# Patient Record
Sex: Female | Born: 1970 | Race: Black or African American | Hispanic: No | Marital: Married | Smoking: Never smoker
Health system: Southern US, Community
[De-identification: ages and names within clinical notes are randomized; demographics above are authoritative.]

## PROBLEM LIST (undated history)

## (undated) DIAGNOSIS — K5792 Diverticulitis of intestine, part unspecified, without perforation or abscess without bleeding: Secondary | ICD-10-CM

## (undated) DIAGNOSIS — K5900 Constipation, unspecified: Secondary | ICD-10-CM

## (undated) DIAGNOSIS — S62109A Fracture of unspecified carpal bone, unspecified wrist, initial encounter for closed fracture: Secondary | ICD-10-CM

## (undated) DIAGNOSIS — E785 Hyperlipidemia, unspecified: Secondary | ICD-10-CM

## (undated) DIAGNOSIS — G4730 Sleep apnea, unspecified: Secondary | ICD-10-CM

## (undated) DIAGNOSIS — E559 Vitamin D deficiency, unspecified: Secondary | ICD-10-CM

## (undated) DIAGNOSIS — T7840XA Allergy, unspecified, initial encounter: Secondary | ICD-10-CM

## (undated) DIAGNOSIS — Z8719 Personal history of other diseases of the digestive system: Secondary | ICD-10-CM

## (undated) DIAGNOSIS — K589 Irritable bowel syndrome without diarrhea: Secondary | ICD-10-CM

## (undated) DIAGNOSIS — K219 Gastro-esophageal reflux disease without esophagitis: Secondary | ICD-10-CM

## (undated) DIAGNOSIS — M2550 Pain in unspecified joint: Secondary | ICD-10-CM

## (undated) DIAGNOSIS — G709 Myoneural disorder, unspecified: Secondary | ICD-10-CM

## (undated) DIAGNOSIS — F32A Depression, unspecified: Secondary | ICD-10-CM

## (undated) DIAGNOSIS — I209 Angina pectoris, unspecified: Secondary | ICD-10-CM

## (undated) DIAGNOSIS — G8929 Other chronic pain: Secondary | ICD-10-CM

## (undated) DIAGNOSIS — R600 Localized edema: Secondary | ICD-10-CM

## (undated) DIAGNOSIS — M549 Dorsalgia, unspecified: Secondary | ICD-10-CM

## (undated) DIAGNOSIS — R110 Nausea: Secondary | ICD-10-CM

## (undated) DIAGNOSIS — R7303 Prediabetes: Secondary | ICD-10-CM

## (undated) DIAGNOSIS — E538 Deficiency of other specified B group vitamins: Secondary | ICD-10-CM

## (undated) DIAGNOSIS — K829 Disease of gallbladder, unspecified: Secondary | ICD-10-CM

## (undated) DIAGNOSIS — M5030 Other cervical disc degeneration, unspecified cervical region: Secondary | ICD-10-CM

## (undated) DIAGNOSIS — R21 Rash and other nonspecific skin eruption: Secondary | ICD-10-CM

## (undated) DIAGNOSIS — R0602 Shortness of breath: Secondary | ICD-10-CM

## (undated) DIAGNOSIS — S82899A Other fracture of unspecified lower leg, initial encounter for closed fracture: Secondary | ICD-10-CM

## (undated) DIAGNOSIS — J45909 Unspecified asthma, uncomplicated: Secondary | ICD-10-CM

## (undated) HISTORY — DX: Sleep apnea, unspecified: G47.30

## (undated) HISTORY — DX: Dorsalgia, unspecified: M54.9

## (undated) HISTORY — DX: Disease of gallbladder, unspecified: K82.9

## (undated) HISTORY — DX: Hyperlipidemia, unspecified: E78.5

## (undated) HISTORY — PX: BREAST CYST EXCISION: SHX579

## (undated) HISTORY — DX: Pain in unspecified joint: M25.50

## (undated) HISTORY — DX: Shortness of breath: R06.02

## (undated) HISTORY — DX: Localized edema: R60.0

## (undated) HISTORY — DX: Diverticulitis of intestine, part unspecified, without perforation or abscess without bleeding: K57.92

## (undated) HISTORY — DX: Vitamin D deficiency, unspecified: E55.9

## (undated) HISTORY — PX: WISDOM TOOTH EXTRACTION: SHX21

## (undated) HISTORY — DX: Depression, unspecified: F32.A

## (undated) HISTORY — DX: Allergy, unspecified, initial encounter: T78.40XA

## (undated) HISTORY — DX: Deficiency of other specified B group vitamins: E53.8

## (undated) HISTORY — DX: Irritable bowel syndrome, unspecified: K58.9

## (undated) HISTORY — DX: Constipation, unspecified: K59.00

## (undated) HISTORY — PX: HERNIA REPAIR: SHX51

---

## 1995-12-22 HISTORY — PX: TUBAL LIGATION: SHX77

## 1997-10-30 ENCOUNTER — Other Ambulatory Visit: Admission: RE | Admit: 1997-10-30 | Discharge: 1997-10-30 | Payer: Self-pay | Admitting: Family Medicine

## 1998-08-19 ENCOUNTER — Encounter: Payer: Self-pay | Admitting: Emergency Medicine

## 1998-08-19 ENCOUNTER — Ambulatory Visit (HOSPITAL_COMMUNITY): Admission: RE | Admit: 1998-08-19 | Discharge: 1998-08-19 | Payer: Self-pay | Admitting: *Deleted

## 2009-02-03 ENCOUNTER — Ambulatory Visit (HOSPITAL_COMMUNITY): Admission: RE | Admit: 2009-02-03 | Discharge: 2009-02-03 | Payer: Self-pay | Admitting: Obstetrics and Gynecology

## 2010-02-17 ENCOUNTER — Ambulatory Visit (HOSPITAL_COMMUNITY)
Admission: RE | Admit: 2010-02-17 | Discharge: 2010-02-17 | Payer: Self-pay | Source: Home / Self Care | Attending: Internal Medicine | Admitting: Internal Medicine

## 2010-06-16 ENCOUNTER — Other Ambulatory Visit (HOSPITAL_COMMUNITY): Payer: Self-pay | Admitting: Gastroenterology

## 2010-06-16 DIAGNOSIS — R110 Nausea: Secondary | ICD-10-CM

## 2010-06-29 ENCOUNTER — Encounter (HOSPITAL_COMMUNITY): Payer: Self-pay

## 2010-06-29 ENCOUNTER — Encounter (HOSPITAL_COMMUNITY)
Admission: RE | Admit: 2010-06-29 | Discharge: 2010-06-29 | Disposition: A | Discharge status: Home / Self Care | Payer: BC Managed Care – PPO | Source: Ambulatory Visit | Attending: Gastroenterology | Admitting: Gastroenterology

## 2010-06-29 ENCOUNTER — Ambulatory Visit (HOSPITAL_COMMUNITY)
Admission: RE | Admit: 2010-06-29 | Discharge: 2010-06-29 | Disposition: A | Discharge status: Home / Self Care | Payer: BC Managed Care – PPO | Source: Ambulatory Visit | Attending: Gastroenterology | Admitting: Gastroenterology

## 2010-06-29 DIAGNOSIS — R1011 Right upper quadrant pain: Secondary | ICD-10-CM | POA: Insufficient documentation

## 2010-06-29 DIAGNOSIS — Q619 Cystic kidney disease, unspecified: Secondary | ICD-10-CM | POA: Insufficient documentation

## 2010-06-29 DIAGNOSIS — R110 Nausea: Secondary | ICD-10-CM | POA: Insufficient documentation

## 2010-06-29 HISTORY — DX: Nausea: R11.0

## 2010-06-29 MED ORDER — TECHNETIUM TC 99M MEBROFENIN IV KIT: 5.5000 | PACK | Freq: Once | INTRAVENOUS | Status: AC | PRN

## 2010-06-29 MED ORDER — SINCALIDE 5 MCG IJ SOLR: 0.0200 ug/kg | Freq: Once | INTRAMUSCULAR | Status: DC

## 2010-07-22 HISTORY — PX: COLONOSCOPY: SHX174

## 2010-08-21 HISTORY — PX: CHOLECYSTECTOMY: SHX55

## 2010-08-23 ENCOUNTER — Encounter (INDEPENDENT_AMBULATORY_CARE_PROVIDER_SITE_OTHER): Payer: Self-pay | Admitting: General Surgery

## 2010-08-23 ENCOUNTER — Ambulatory Visit (INDEPENDENT_AMBULATORY_CARE_PROVIDER_SITE_OTHER): Payer: BC Managed Care – PPO | Admitting: General Surgery

## 2010-08-23 DIAGNOSIS — D126 Benign neoplasm of colon, unspecified: Secondary | ICD-10-CM

## 2010-08-23 DIAGNOSIS — K635 Polyp of colon: Secondary | ICD-10-CM

## 2010-08-23 DIAGNOSIS — R109 Unspecified abdominal pain: Secondary | ICD-10-CM | POA: Insufficient documentation

## 2010-08-23 DIAGNOSIS — R630 Anorexia: Secondary | ICD-10-CM

## 2010-08-23 DIAGNOSIS — J45909 Unspecified asthma, uncomplicated: Secondary | ICD-10-CM | POA: Insufficient documentation

## 2010-08-23 DIAGNOSIS — Z973 Presence of spectacles and contact lenses: Secondary | ICD-10-CM

## 2010-08-23 DIAGNOSIS — K828 Other specified diseases of gallbladder: Secondary | ICD-10-CM

## 2010-08-23 DIAGNOSIS — R233 Spontaneous ecchymoses: Secondary | ICD-10-CM | POA: Insufficient documentation

## 2010-08-23 DIAGNOSIS — R238 Other skin changes: Secondary | ICD-10-CM

## 2010-08-23 DIAGNOSIS — E7800 Pure hypercholesterolemia, unspecified: Secondary | ICD-10-CM | POA: Insufficient documentation

## 2010-08-23 NOTE — Progress Notes (Signed)
Subjective:     Patient ID: [name], female   DOB: [birth year], 40 y.o.   MRN: [id number]    BP 136/94  Pulse 72  Temp(Src) 96.4 F (35.8 C) (Temporal)  Ht 5\' 8"  (1.727 m)  Wt 223 lb (101.152 kg)  BMI 33.91 kg/m2    HPI The patient presents with a history of abdominal pain starting in her epigastrium radiating towards her left upper quadrant in her lower pubic abdominal area. This is usually postprandial pain associated with nausea but no vomiting she's had no fevers and chills and no jaundice. Her evaluation has consisted of a colonoscopy, upper GI endoscopy, an ultrasound of the abdomen, and a nuclear medicine study of her gallbladder. The only positive study was a nuclear medicine study which showed an ejection fraction of 34.4%. On the patient's history she reports that she had significant discomfort and retardation of her usual postprandial symptoms with injection of the cholecystokinin was given for her nuclear medicine study.  Review of Systems  Constitutional: Positive for appetite change.  Gastrointestinal: Positive for nausea, abdominal pain (LUQ and suprapubic) and constipation (history of chronic constipation). Negative for vomiting and diarrhea.       Objective:   Physical Exam  Constitutional: She is oriented to person, place, and time. She appears well-developed and well-nourished.  HENT:  Head: Normocephalic and atraumatic.  Nose: Nose normal.  Eyes: EOM are normal. Pupils are equal, round, and reactive to light.  Neck: Normal range of motion. Neck supple.  Cardiovascular: Normal rate and regular rhythm.   No murmur heard. Pulmonary/Chest: Effort normal and breath sounds normal.  Abdominal: Soft. There is tenderness (Mild LUQ tendernes and epigastric tenderness).  Musculoskeletal: Normal range of motion.  Lymphadenopathy:    She has no cervical adenopathy.  Neurological: She is alert and oriented to person, place, and time. She displays normal reflexes.    Skin: Skin is warm and dry.  Psychiatric: She has a normal mood and affect. Her behavior is normal. Thought content normal.       Assessment:     Biliary Dyskinesia with symptoms. The patient had symptoms that were provoked by cholecystokinin injection. Her pain is usually in the left upper quadrant and epigastrium and it is very frequently postprandially with anything that she eats. She has no jaundice and no evidence of stones on recent ultrasound.  The patient has also had an upper GI endoscopy and colonoscopy recently. She was told that she had several colon polyps that were removed that were benign. She also had some evidence of diverticulosis. Upper GI endoscopy was within normal limits.    Plan:     Because of patient's symptoms are very reproducible after she eats, and the fact that she had symptoms after injection of cholecystokinin, I do feel as though the patient has significant relief of her symptoms after cholecystectomy the plan is to perform a laparoscopic cholecystectomy with cholangiogram. This hopefully will resolve her current symptoms however if not we will look in the left upper quadrant where significant amount of her pain resides to see if there is any significant pathology to explain her pain.

## 2010-09-15 ENCOUNTER — Encounter (HOSPITAL_COMMUNITY)
Admission: RE | Admit: 2010-09-15 | Discharge: 2010-09-15 | Disposition: A | Discharge status: Home / Self Care | Payer: BC Managed Care – PPO | Source: Ambulatory Visit | Attending: General Surgery | Admitting: General Surgery

## 2010-09-15 LAB — SURGICAL PCR SCREEN
MRSA, PCR: NEGATIVE
Staphylococcus aureus: NEGATIVE

## 2010-09-15 LAB — CBC
HCT: 41.1 % (ref 36.0–46.0)
Hemoglobin: 14.1 g/dL (ref 12.0–15.0)
MCH: 27.5 pg (ref 26.0–34.0)
MCHC: 34.3 g/dL (ref 30.0–36.0)
RDW: 12.9 % (ref 11.5–15.5)

## 2010-09-15 LAB — DIFFERENTIAL
Eosinophils Relative: 1 % (ref 0–5)
Lymphocytes Relative: 44 % (ref 12–46)
Monocytes Absolute: 0.3 10*3/uL (ref 0.1–1.0)
Monocytes Relative: 7 % (ref 3–12)
Neutro Abs: 2.3 10*3/uL (ref 1.7–7.7)

## 2010-09-15 LAB — COMPREHENSIVE METABOLIC PANEL
BUN: 8 mg/dL (ref 6–23)
Calcium: 9.6 mg/dL (ref 8.4–10.5)
Creatinine, Ser: 0.59 mg/dL (ref 0.50–1.10)
GFR calc Af Amer: >60 mL/min (ref >60)
Glucose, Bld: 91 mg/dL (ref 70–99)
Total Protein: 7 g/dL (ref 6.0–8.3)

## 2010-09-15 LAB — HCG, SERUM, QUALITATIVE: Preg, Serum: NEGATIVE

## 2010-09-20 ENCOUNTER — Ambulatory Visit (HOSPITAL_COMMUNITY): Payer: BC Managed Care – PPO

## 2010-09-20 ENCOUNTER — Ambulatory Visit (HOSPITAL_COMMUNITY)
Admission: RE | Admit: 2010-09-20 | Discharge: 2010-09-20 | Disposition: A | Discharge status: Home / Self Care | Payer: BC Managed Care – PPO | Source: Ambulatory Visit | Attending: General Surgery | Admitting: General Surgery

## 2010-09-20 ENCOUNTER — Other Ambulatory Visit (INDEPENDENT_AMBULATORY_CARE_PROVIDER_SITE_OTHER): Payer: Self-pay | Admitting: General Surgery

## 2010-09-20 DIAGNOSIS — K811 Chronic cholecystitis: Secondary | ICD-10-CM

## 2010-09-20 DIAGNOSIS — Z01812 Encounter for preprocedural laboratory examination: Secondary | ICD-10-CM | POA: Insufficient documentation

## 2010-09-21 NOTE — Op Note (Signed)
[name], [name] NO.:  [id number]  MEDICAL RECORD NO.:  [id number]  LOCATION:  [facility]                         FACILITY:  [facility]  PHYSICIAN:  [name], M.D.    DATE OF BIRTH:  1971/01/01  DATE OF PROCEDURE:  09/20/2010 DATE OF DISCHARGE:                              OPERATIVE REPORT   PREOPERATIVE DIAGNOSIS:  Biliary dyskinesia with symptoms.  POSTOPERATIVE DIAGNOSIS:  Biliary dyskinesia with symptoms.  PROCEDURE:  Laparoscopic cholecystectomy with cholangiogram.  SURGEON:  Tanya Ridges, MD.  ANESTHESIA:  General endotracheal.  ESTIMATED BLOOD LOSS:  Less than 20 mL.  No complications.  CONDITION:  Stable.  FINDINGS:  Normal intraoperative cholangiogram with no evidence of acute inflammation.  INDICATIONS FOR OPERATION:  The patient is a 40 year old with symptomatic biliary dyskinesia with an ejection fraction of 34%, comes in now for elective laparoscopic cholecystectomy.  OPERATION:  The patient was taken to the operating room, placed on table in supine position.  After an adequate general endotracheal anesthetic was administered, she was prepped and draped in usual sterile manner, exposing midline.  A supraumbilical midline incision was made using #15 blade and taken down to the midline fascia.  It was noted upon entering that area that the patient had a supraumbilical 1-2 cm supraumbilical fascial defect, which was used as the portal site for the Hasson cannula.  After we isolated the fascial opening and caught off the hernia sac, we passed a pursestring suture of 0 Vicryl around the fascial opening and then passed a Hasson cannula into the peritoneal cavity, which we used to insufflate with carbon dioxide gas up to a maximal pressure of 50 mmHg.  Once this was done, two right upper quadrant 5-mm cannulas and a subxiphoid 5-mm cannula passed under direct vision.  We then placed the patient in reverse Trendelenburg, left-side was  tilted down, we began our dissection.  We grabbed the gallbladder with a ratcheted grasper through the lateral- most cannula and retracted towards the anterior abdominal wall and right upper quadrant.  A second grasper was used in order to open up the peritoneum overlying the triangle of Fallot and hepatic duodenal triangle.  We dissected out the cystic duct and cystic artery.  Once the cystic duct was dissected free, we clipped along the gallbladder side, made a choledococystomy using laparoscopic scissors, passed a Cook catheter through the anterior abdominal wall and then passed the catheter tip into the choledococystomy for the cholangiogram.  The cholangiogram showed good flow into the duodenum, no intraductal filling defects, no dilatation, good proximal filling.  Once the cholangiogram was completed, we removed the clip securing it in place.  We clipped the cystic duct distally x3 then transected it.  We dissected out the cystic artery and clipped it proximally x2 and distally x2 and then transected it.  We then dissected out the gallbladder from its bed with minimal difficulty without any puncturing or leakage of bile.  Once this was completed, we removed the gallbladder from the supraumbilical site using a large grasper.  We then tied off the fascial site using a pursestring suture was in place.  We had to reinforce that with a corner stitch of  figure-of-eight of 0 Vicryl because of a small opening.  We irrigated with about 200 mL of saline solution.  There was minimal bleeding and no bile staining.  All fluid and gas were aspirated from above the liver.  We removed all cannulas.  The skin at the supraumbilical site was closed using running subcuticular stitch of 4-0 Monocryl.  This was after all sites were injected with 0.25% Marcaine with epi.  Dermabond, Steri-Strips, and Tegaderm was used to complete all dressing.  All needle, sponge counts, and instrument counts were  correct.     [name], M.D.     JOW/MEDQ  D:  09/20/2010  T:  09/20/2010  Job:  [id number]  cc:   Tanya Rod, MD, [facility]  Electronically Signed by [name] M.D. on 09/21/2010 02:39:51 PM

## 2010-10-04 ENCOUNTER — Ambulatory Visit (INDEPENDENT_AMBULATORY_CARE_PROVIDER_SITE_OTHER): Payer: BC Managed Care – PPO | Admitting: General Surgery

## 2010-10-04 ENCOUNTER — Encounter (INDEPENDENT_AMBULATORY_CARE_PROVIDER_SITE_OTHER): Payer: Self-pay | Admitting: General Surgery

## 2010-10-04 DIAGNOSIS — Z09 Encounter for follow-up examination after completed treatment for conditions other than malignant neoplasm: Secondary | ICD-10-CM

## 2010-10-04 NOTE — Progress Notes (Signed)
HPI The patient is status post laparoscopic cholecystectomy and is doing well.  PE On examination all her incisions are healing well with no evidence of infection. I was able to remove all the dressings without any separation of the wound  Studiy review None.  Assessment We will status post laparoscopic cholecystectomy.  Plan The patient is to take another week out from the ED heavy lifting more than 25 pounds and he can resume her usual activity. She is to return to see me on a p.r.n. basis

## 2011-01-17 DIAGNOSIS — G8929 Other chronic pain: Secondary | ICD-10-CM

## 2011-01-17 HISTORY — DX: Other chronic pain: G89.29

## 2011-01-31 ENCOUNTER — Ambulatory Visit: Payer: Worker's Compensation

## 2011-02-08 ENCOUNTER — Ambulatory Visit (INDEPENDENT_AMBULATORY_CARE_PROVIDER_SITE_OTHER): Payer: BC Managed Care – PPO

## 2011-02-08 ENCOUNTER — Ambulatory Visit (INDEPENDENT_AMBULATORY_CARE_PROVIDER_SITE_OTHER): Payer: Worker's Compensation

## 2011-02-08 DIAGNOSIS — N898 Other specified noninflammatory disorders of vagina: Secondary | ICD-10-CM

## 2011-02-08 DIAGNOSIS — R82998 Other abnormal findings in urine: Secondary | ICD-10-CM

## 2011-02-17 ENCOUNTER — Other Ambulatory Visit (HOSPITAL_COMMUNITY): Payer: Self-pay | Admitting: Internal Medicine

## 2011-02-17 DIAGNOSIS — Z1231 Encounter for screening mammogram for malignant neoplasm of breast: Secondary | ICD-10-CM

## 2011-03-16 ENCOUNTER — Ambulatory Visit (HOSPITAL_COMMUNITY)
Admission: RE | Admit: 2011-03-16 | Discharge: 2011-03-16 | Disposition: A | Discharge status: Home / Self Care | Payer: BC Managed Care – PPO | Source: Ambulatory Visit | Attending: Internal Medicine | Admitting: Internal Medicine

## 2011-03-16 DIAGNOSIS — Z1231 Encounter for screening mammogram for malignant neoplasm of breast: Secondary | ICD-10-CM | POA: Insufficient documentation

## 2011-03-24 ENCOUNTER — Ambulatory Visit (INDEPENDENT_AMBULATORY_CARE_PROVIDER_SITE_OTHER): Payer: BC Managed Care – PPO | Admitting: Physician Assistant

## 2011-03-24 ENCOUNTER — Encounter: Payer: Self-pay | Admitting: Physician Assistant

## 2011-03-24 VITALS — BP 126/88 | HR 82 | Temp 97.8°F | Resp 16 | Ht 68.0 in | Wt 231.0 lb

## 2011-03-24 DIAGNOSIS — B3731 Acute candidiasis of vulva and vagina: Secondary | ICD-10-CM

## 2011-03-24 DIAGNOSIS — R110 Nausea: Secondary | ICD-10-CM

## 2011-03-24 DIAGNOSIS — R1033 Periumbilical pain: Secondary | ICD-10-CM

## 2011-03-24 DIAGNOSIS — B373 Candidiasis of vulva and vagina: Secondary | ICD-10-CM

## 2011-03-24 DIAGNOSIS — N760 Acute vaginitis: Secondary | ICD-10-CM

## 2011-03-24 DIAGNOSIS — K439 Ventral hernia without obstruction or gangrene: Secondary | ICD-10-CM

## 2011-03-24 DIAGNOSIS — A499 Bacterial infection, unspecified: Secondary | ICD-10-CM

## 2011-03-24 DIAGNOSIS — N390 Urinary tract infection, site not specified: Secondary | ICD-10-CM

## 2011-03-24 DIAGNOSIS — N898 Other specified noninflammatory disorders of vagina: Secondary | ICD-10-CM

## 2011-03-24 LAB — POCT URINALYSIS DIPSTICK
Bilirubin, UA: NEGATIVE
Glucose, UA: NEGATIVE
Leukocytes, UA: NEGATIVE
Nitrite, UA: NEGATIVE
pH, UA: 5.5

## 2011-03-24 LAB — POCT WET PREP WITH KOH: KOH Prep POC: POSITIVE

## 2011-03-24 LAB — GLUCOSE, POCT (MANUAL RESULT ENTRY): POC Glucose: 94

## 2011-03-24 LAB — POCT UA - MICROSCOPIC ONLY: Yeast, UA: NEGATIVE

## 2011-03-24 MED ORDER — FLUCONAZOLE 150 MG PO TABS: ORAL_TABLET | ORAL | Status: DC

## 2011-03-24 NOTE — Patient Instructions (Signed)
If pain suddenly worsens, the hernia is at risk for strangulation. Patient to go to Emergency Room if this happens prior to consultation with surgeon.  Patient will alert Physical Therapy to minimize any procedures or exercises that may increase intra-abdominal pressure or reproduce/stimulate vagal maneuver. Patient will have Physical Therapy review this note.

## 2011-03-24 NOTE — Progress Notes (Signed)
Subjective:    Patient ID: [name], female    DOB: 07-31-1970, 41 y.o.   MRN: [id number]  HPI  Tanya Aliment, MD, MD  41 y.o. y/o AAF presents with Mid abdominal pain x 1 week.  (See last OV-also here for recheck vaginal discharge and UTI.)  Still c/o vaginal itching recurring.  HPI characteristics below are regarding abd pain.  Recently started PT for worker's comp.  NO Std risk factors  Onset: 1 week ago Location mid abdomen Duration 1 week Severity mild to moderate Quality/Character Sharp, intense Exacerbating/Alleviating Factors: worsened by vagal maneuvers, sitting up  Associated Signs and Symptoms: Nausea  Past Medical History  Diagnosis Date  . Abdominal pain   . Nausea     Family History  Problem Relation Age of Onset  . Diabetes Mother   . Heart disease Father   . Cancer Father     kidney and prostate  . Hyperlipidemia Father   . Other Father     gout and clogged arteries  . Hyperlipidemia Sister   . Diabetes Sister   . Diabetes Brother   . Hyperlipidemia Brother    Surgeon:  Dr. Lindie Spruce Smoker: NO Alcohol/illicits: NO  Past Surgical History  Procedure Date  . Tubal ligation 01/1996  . Colonoscopy 07/2010    Removal of 3 polyps  . Abscess removal 1983    left breast  . Cholecystectomy     Health Maintenance: Due for pap this year  Review of Systems  Constitutional: Negative for fever, chills and appetite change.  HENT: Negative.   Eyes: Negative.   Respiratory: Negative.   Cardiovascular: Negative.   Gastrointestinal: Negative for abdominal distention.  Genitourinary: Positive for vaginal discharge (pruritis). Negative for dysuria, urgency, frequency, hematuria, flank pain, vaginal bleeding, difficulty urinating and dyspareunia.  Musculoskeletal: Negative.   Neurological: Negative.   Hematological: Negative.   Psychiatric/Behavioral: Negative.        Objective:   Physical Exam  Nursing note and vitals reviewed. Constitutional:  She appears well-developed and well-nourished. No distress (non-toxic appearing).  HENT:  Head: Normocephalic and atraumatic.  Eyes: Conjunctivae and EOM are normal. Pupils are equal, round, and reactive to light.  Neck: Normal range of motion. Neck supple. No thyromegaly present.  Cardiovascular: Normal rate, regular rhythm, normal heart sounds and intact distal pulses.  Exam reveals no gallop and no friction rub.   No murmur heard. Pulmonary/Chest: Effort normal and breath sounds normal.  Abdominal: Soft. Normal appearance, normal aorta and bowel sounds are normal. She exhibits no distension, no fluid wave, no abdominal bruit, no ascites, no pulsatile midline mass and no mass. There is no hepatosplenomegaly, splenomegaly or hepatomegaly. There is no rigidity, no rebound, no guarding, no CVA tenderness, no tenderness at McBurney's point and negative Murphy's sign. A hernia is present. Hernia confirmed positive in the ventral area. Hernia confirmed negative in the right inguinal area and confirmed negative in the left inguinal area.                                                            Range  Results for orders placed in visit on 03/24/11  POCT UA - MICROSCOPIC ONLY      Component Value Range   WBC, Ur, HPF, POC 1-2     RBC, urine, microscopic 1-2     Bacteria, U Microscopic trace     Mucus, UA trace     Epithelial cells, urine per micros 1-3     Crystals, Ur, HPF, POC negative     Casts, Ur, LPF, POC negative     Yeast, UA negative    POCT URINALYSIS DIPSTICK      Component Value Range   Color, UA yellow     Clarity, UA clear     Glucose, UA negative     Bilirubin, UA negative     Ketones, UA negative     Spec Grav, UA 1.020     Blood, UA small     pH, UA 5.5     Protein, UA negative     Urobilinogen, UA 0.2     Nitrite, UA negative     Leukocytes, UA Negative    POCT WET PREP WITH  KOH      Component Value Range   Trichomonas, UA Negative     Clue Cells Wet Prep HPF POC 3-6     Epithelial Wet Prep HPF POC 3-6     Yeast Wet Prep HPF POC negative     Bacteria Wet Prep HPF POC 2+     RBC Wet Prep HPF POC 0-1     WBC Wet Prep HPF POC 0-1     KOH Prep POC Positive    POCT URINE PREGNANCY      Component Value Range   Preg Test, Ur Negative    GLUCOSE, POCT (MANUAL RESULT ENTRY)      Component Value Range   POC Glucose 94         Assessment & Plan:  Reducible ventral hernia-refer to general surgery.  Preferably Dr. Lindie Spruce.  Yeast vaginitis-Rx diflucan

## 2011-03-30 ENCOUNTER — Other Ambulatory Visit: Payer: Self-pay | Admitting: Nurse Practitioner

## 2011-03-30 DIAGNOSIS — K469 Unspecified abdominal hernia without obstruction or gangrene: Secondary | ICD-10-CM

## 2011-03-31 ENCOUNTER — Ambulatory Visit
Admission: RE | Admit: 2011-03-31 | Discharge: 2011-03-31 | Disposition: A | Discharge status: Home / Self Care | Payer: BC Managed Care – PPO | Source: Ambulatory Visit | Attending: Nurse Practitioner | Admitting: Nurse Practitioner

## 2011-03-31 DIAGNOSIS — K469 Unspecified abdominal hernia without obstruction or gangrene: Secondary | ICD-10-CM

## 2011-03-31 MED ORDER — IOHEXOL 300 MG/ML  SOLN
125.0000 mL | Freq: Once | INTRAMUSCULAR | Status: AC | PRN
  Administered 2011-03-31: 125 mL via INTRAVENOUS

## 2011-04-17 ENCOUNTER — Telehealth (INDEPENDENT_AMBULATORY_CARE_PROVIDER_SITE_OTHER): Payer: Self-pay

## 2011-04-17 NOTE — Telephone Encounter (Signed)
The patient called to move her appointment up because she has a hernia.  She is getting nauseated feeling like she may vomit and her bowels haven't moved in 2 days.  I moved her appt to 3/1 per Marcelino Duster.  I advised she needs to take some Miralax for her bowels, drink plenty of liquids and stay on a liquid diet until she has a bowel movement.  She will call us back if that doesn't work.

## 2011-04-21 ENCOUNTER — Encounter (INDEPENDENT_AMBULATORY_CARE_PROVIDER_SITE_OTHER): Payer: Self-pay | Admitting: General Surgery

## 2011-04-21 ENCOUNTER — Ambulatory Visit (INDEPENDENT_AMBULATORY_CARE_PROVIDER_SITE_OTHER): Payer: BC Managed Care – PPO | Admitting: General Surgery

## 2011-04-21 VITALS — BP 132/88 | HR 80 | Temp 97.8°F | Resp 16 | Ht 69.0 in | Wt 231.8 lb

## 2011-04-21 DIAGNOSIS — K432 Incisional hernia without obstruction or gangrene: Secondary | ICD-10-CM

## 2011-04-21 NOTE — Progress Notes (Deleted)
Patient ID: [name], female   DOB: 1970/08/07, 41 y.o.   MRN: [id number]  Chief Complaint  Patient presents with  . Umbilical Hernia    est pt- eval peri-umb hernia    HPI [name] is a 41 y.o. female.  *** HPI  Past Medical History  Diagnosis Date  . Abdominal pain   . Nausea   . Hernia   . Back pain   . Hyperlipidemia   . Asthma     Past Surgical History  Procedure Date  . Tubal ligation 01/1996  . Colonoscopy 07/2010    Removal of 3 polyps  . Abscess removal 1983    left breast  . Cholecystectomy   . Wisdom tooth extraction     Family History  Problem Relation Age of Onset  . Diabetes Mother   . Heart disease Father   . Cancer Father     kidney and prostate  . Hyperlipidemia Father   . Other Father     gout and clogged arteries  . Hyperlipidemia Sister   . Diabetes Sister   . Diabetes Brother   . Hyperlipidemia Brother     Social History History  Substance Use Topics  . Smoking status: Never Smoker   . Smokeless tobacco: Not on file  . Alcohol Use: No    Allergies  Allergen Reactions  . Amoxicillin Hives  . Sulfa Antibiotics Hives    Current Outpatient Prescriptions  Medication Sig Dispense Refill  . albuterol (PROVENTIL HFA;VENTOLIN HFA) 108 (90 BASE) MCG/ACT inhaler Inhale 2 puffs into the lungs every 6 (six) hours as needed.      . cetirizine (ZYRTEC) 10 MG tablet Take 10 mg by mouth daily.        . cyclobenzaprine (FLEXERIL) 10 MG tablet Take 10 mg by mouth 3 (three) times daily as needed.      . docusate sodium (COLACE) 50 MG capsule Take by mouth 2 (two) times daily.        . fluconazole (DIFLUCAN) 150 MG tablet 1 tab po each week for the next 4 weeks  4 tablet  0  . naproxen sodium (ANAPROX) 220 MG tablet Take 220 mg by mouth 2 (two) times daily with a meal.      . Polyethylene Glycol 3350 (MIRALAX PO) Take by mouth daily.        . Probiotic Product (SOLUBLE FIBER/PROBIOTICS PO) Take by mouth. Patient also take a fiber  multivitamin.       Marland Kitchen RANITIDINE HCL PO Take by mouth as needed.      . rosuvastatin (CRESTOR) 5 MG tablet Take 5 mg by mouth daily.        . traMADol (ULTRAM) 50 MG tablet as needed.        Review of Systems Review of Systems  Blood pressure 132/88, pulse 80, temperature 97.8 F (36.6 C), temperature source Temporal, resp. rate 16, height 5\' 9"  (1.753 m), weight 231 lb 12.8 oz (105.144 kg).  Physical Exam Physical Exam  Data Reviewed ***  Assessment    ***    Plan    ***       Selestino Nila III,Ann[place] Nellums O 04/21/2011, 2:28 PM

## 2011-04-21 NOTE — Progress Notes (Signed)
Patient ID: [name], female   DOB: 1970/04/07, 41 y.o.   MRN: [id number]  Chief Complaint  Patient presents with  . Umbilical Hernia    est pt- eval peri-umb hernia    HPI [name] is a 41 y.o. female.  With peri-umbilical hernia near GB incision site HPI  Past Medical History  Diagnosis Date  . Abdominal pain   . Nausea   . Hernia   . Back pain   . Hyperlipidemia   . Asthma     Past Surgical History  Procedure Date  . Tubal ligation 01/1996  . Colonoscopy 07/2010    Removal of 3 polyps  . Abscess removal 1983    left breast  . Cholecystectomy   . Wisdom tooth extraction     Family History  Problem Relation Age of Onset  . Diabetes Mother   . Heart disease Father   . Cancer Father     kidney and prostate  . Hyperlipidemia Father   . Other Father     gout and clogged arteries  . Hyperlipidemia Sister   . Diabetes Sister   . Diabetes Brother   . Hyperlipidemia Brother     Social History History  Substance Use Topics  . Smoking status: Never Smoker   . Smokeless tobacco: Not on file  . Alcohol Use: No    Allergies  Allergen Reactions  . Amoxicillin Hives  . Sulfa Antibiotics Hives    Current Outpatient Prescriptions  Medication Sig Dispense Refill  . albuterol (PROVENTIL HFA;VENTOLIN HFA) 108 (90 BASE) MCG/ACT inhaler Inhale 2 puffs into the lungs every 6 (six) hours as needed.      . cetirizine (ZYRTEC) 10 MG tablet Take 10 mg by mouth daily.        . cyclobenzaprine (FLEXERIL) 10 MG tablet Take 10 mg by mouth 3 (three) times daily as needed.      . docusate sodium (COLACE) 50 MG capsule Take by mouth 2 (two) times daily.        . fluconazole (DIFLUCAN) 150 MG tablet 1 tab po each week for the next 4 weeks  4 tablet  0  . naproxen sodium (ANAPROX) 220 MG tablet Take 220 mg by mouth 2 (two) times daily with a meal.      . Polyethylene Glycol 3350 (MIRALAX PO) Take by mouth daily.        . Probiotic Product (SOLUBLE FIBER/PROBIOTICS  PO) Take by mouth. Patient also take a fiber multivitamin.       Marland Kitchen RANITIDINE HCL PO Take by mouth as needed.      . rosuvastatin (CRESTOR) 5 MG tablet Take 5 mg by mouth daily.        . traMADol (ULTRAM) 50 MG tablet as needed.        Review of Systems Review of Systems  Constitutional: Negative.   HENT: Negative.   Respiratory: Negative.   Cardiovascular: Negative.   Gastrointestinal: Positive for abdominal pain (periumbilical and LUQ, same as prior to GB surgery).  Genitourinary: Negative.   Musculoskeletal: Negative.  Back pain: fall last year.  Skin: Negative.   Hematological: Negative.   Psychiatric/Behavioral: Negative.     Blood pressure 132/88, pulse 80, temperature 97.8 F (36.6 C), temperature source Temporal, resp. rate 16, height 5\' 9"  (1.753 m), weight 231 lb 12.8 oz (105.144 kg).  Physical Exam Physical Exam  Constitutional: She is oriented to person, place, and time. She appears well-developed and well-nourished.  HENT:  Head:  Normocephalic and atraumatic.  Eyes: Conjunctivae and EOM are normal. Pupils are equal, round, and reactive to light.  Neck: Normal range of motion. Neck supple.  Cardiovascular: Normal rate and regular rhythm.   Pulmonary/Chest: Effort normal and breath sounds normal.  Abdominal: There is tenderness (at herna site).    Musculoskeletal: Normal range of motion.  Neurological: She is alert and oriented to person, place, and time. She has normal reflexes.  Skin: Skin is warm.  Psychiatric: She has a normal mood and affect.    Data Reviewed CT scan showing hernia  Assessment    Incisional/peri-umbilical hernia    Plan    Laparoscopic repair with mesh       Heather Mckendree III,Caralynn Gelber O 04/21/2011, 2:43 PM

## 2011-05-02 ENCOUNTER — Encounter (INDEPENDENT_AMBULATORY_CARE_PROVIDER_SITE_OTHER): Payer: BC Managed Care – PPO | Admitting: General Surgery

## 2011-05-22 ENCOUNTER — Other Ambulatory Visit: Payer: Self-pay

## 2011-05-22 ENCOUNTER — Telehealth (INDEPENDENT_AMBULATORY_CARE_PROVIDER_SITE_OTHER): Payer: Self-pay | Admitting: General Surgery

## 2011-05-22 ENCOUNTER — Observation Stay (HOSPITAL_COMMUNITY)
Admission: EM | Admit: 2011-05-22 | Discharge: 2011-05-22 | Disposition: A | Discharge status: Home / Self Care | Payer: BC Managed Care – PPO | Attending: Emergency Medicine | Admitting: Emergency Medicine

## 2011-05-22 ENCOUNTER — Encounter (HOSPITAL_COMMUNITY): Payer: Self-pay | Admitting: Emergency Medicine

## 2011-05-22 ENCOUNTER — Emergency Department (HOSPITAL_COMMUNITY): Payer: BC Managed Care – PPO

## 2011-05-22 DIAGNOSIS — R079 Chest pain, unspecified: Secondary | ICD-10-CM

## 2011-05-22 DIAGNOSIS — Z8249 Family history of ischemic heart disease and other diseases of the circulatory system: Secondary | ICD-10-CM | POA: Insufficient documentation

## 2011-05-22 DIAGNOSIS — E7800 Pure hypercholesterolemia, unspecified: Secondary | ICD-10-CM | POA: Insufficient documentation

## 2011-05-22 DIAGNOSIS — I209 Angina pectoris, unspecified: Secondary | ICD-10-CM

## 2011-05-22 DIAGNOSIS — K469 Unspecified abdominal hernia without obstruction or gangrene: Secondary | ICD-10-CM

## 2011-05-22 DIAGNOSIS — R110 Nausea: Secondary | ICD-10-CM | POA: Insufficient documentation

## 2011-05-22 DIAGNOSIS — E669 Obesity, unspecified: Secondary | ICD-10-CM | POA: Insufficient documentation

## 2011-05-22 HISTORY — DX: Angina pectoris, unspecified: I20.9

## 2011-05-22 LAB — POCT I-STAT TROPONIN I
Troponin i, poc: 0 ng/mL (ref 0.00–0.08)
Troponin i, poc: 0 ng/mL (ref 0.00–0.08)
Troponin i, poc: 0 ng/mL (ref 0.00–0.08)

## 2011-05-22 LAB — CBC
HCT: 38.7 % (ref 36.0–46.0)
Hemoglobin: 13.1 g/dL (ref 12.0–15.0)
MCH: 27.6 pg (ref 26.0–34.0)
MCHC: 33.9 g/dL (ref 30.0–36.0)
MCV: 81.5 fL (ref 78.0–100.0)
Platelets: 201 10*3/uL (ref 150–400)
RBC: 4.75 MIL/uL (ref 3.87–5.11)
RDW: 12.5 % (ref 11.5–15.5)
WBC: 5.5 10*3/uL (ref 4.0–10.5)

## 2011-05-22 LAB — DIFFERENTIAL
Basophils Absolute: 0 10*3/uL (ref 0.0–0.1)
Basophils Relative: 0 % (ref 0–1)
Eosinophils Absolute: 0.1 10*3/uL (ref 0.0–0.7)
Eosinophils Relative: 1 % (ref 0–5)
Lymphocytes Relative: 49 % — ABNORMAL HIGH (ref 12–46)
Lymphs Abs: 2.7 10*3/uL (ref 0.7–4.0)
Monocytes Absolute: 0.3 10*3/uL (ref 0.1–1.0)
Monocytes Relative: 6 % (ref 3–12)
Neutro Abs: 2.4 10*3/uL (ref 1.7–7.7)
Neutrophils Relative %: 44 % (ref 43–77)

## 2011-05-22 LAB — COMPREHENSIVE METABOLIC PANEL
ALT: 15 U/L (ref 0–35)
AST: 17 U/L (ref 0–37)
Albumin: 3.8 g/dL (ref 3.5–5.2)
Alkaline Phosphatase: 56 U/L (ref 39–117)
BUN: 10 mg/dL (ref 6–23)
CO2: 27 mEq/L (ref 19–32)
Calcium: 9 mg/dL (ref 8.4–10.5)
Chloride: 105 mEq/L (ref 96–112)
Creatinine, Ser: 0.67 mg/dL (ref 0.50–1.10)
GFR calc Af Amer: >90 mL/min (ref >90)
GFR calc non Af Amer: >90 mL/min (ref >90)
Glucose, Bld: 106 mg/dL — ABNORMAL HIGH (ref 70–99)
Potassium: 4.1 mEq/L (ref 3.5–5.1)
Sodium: 140 mEq/L (ref 135–145)
Total Bilirubin: 0.2 mg/dL — ABNORMAL LOW (ref 0.3–1.2)
Total Protein: 6.6 g/dL (ref 6.0–8.3)

## 2011-05-22 MED ORDER — ASPIRIN 81 MG PO CHEW
324.0000 mg | CHEWABLE_TABLET | Freq: Once | ORAL | Status: AC
  Administered 2011-05-22: 324 mg via ORAL
  Filled 2011-05-22: qty 4

## 2011-05-22 MED ORDER — ONDANSETRON 8 MG PO TBDP: 8.0000 mg | ORAL_TABLET | Freq: Three times a day (TID) | ORAL | Status: AC | PRN

## 2011-05-22 MED ORDER — HYDROCODONE-ACETAMINOPHEN 5-325 MG PO TABS: 1.0000 | ORAL_TABLET | Freq: Four times a day (QID) | ORAL | Status: AC | PRN

## 2011-05-22 NOTE — ED Notes (Signed)
Returned from stress echo.

## 2011-05-22 NOTE — ED Notes (Signed)
Patient currently sitting up in bed; no respiratory or acute distress noted.  Patient updated on plan of care; informed patient that we are currently waiting on EDP to come and talk about test results.  Patient has no other questions or concerns at this time; will continue to monitor. 

## 2011-05-22 NOTE — Discharge Instructions (Signed)
Chest Pain (Nonspecific) It is often hard to give a specific diagnosis for the cause of chest pain. There is always a chance that your pain could be related to something serious, such as a heart attack or a blood clot in the lungs. You need to follow up with your caregiver for further evaluation. CAUSES   Heartburn.   Pneumonia or bronchitis.   Anxiety or stress.   Inflammation around your heart (pericarditis) or lung (pleuritis or pleurisy).   A blood clot in the lung.   A collapsed lung (pneumothorax). It can develop suddenly on its own (spontaneous pneumothorax) or from injury (trauma) to the chest.   Shingles infection (herpes zoster virus).  The chest wall is composed of bones, muscles, and cartilage. Any of these can be the source of the pain.  The bones can be bruised by injury.   The muscles or cartilage can be strained by coughing or overwork.   The cartilage can be affected by inflammation and become sore (costochondritis).  DIAGNOSIS  Lab tests or other studies, such as X-rays, electrocardiography, stress testing, or cardiac imaging, may be needed to find the cause of your pain.  TREATMENT   Treatment depends on what may be causing your chest pain. Treatment may include:   Acid blockers for heartburn.   Anti-inflammatory medicine.   Pain medicine for inflammatory conditions.   Antibiotics if an infection is present.   You may be advised to change lifestyle habits. This includes stopping smoking and avoiding alcohol, caffeine, and chocolate.   You may be advised to keep your head raised (elevated) when sleeping. This reduces the chance of acid going backward from your stomach into your esophagus.   Most of the time, nonspecific chest pain will improve within 2 to 3 days with rest and mild pain medicine.  HOME CARE INSTRUCTIONS   If antibiotics were prescribed, take your antibiotics as directed. Finish them even if you start to feel better.   For the next few  days, avoid physical activities that bring on chest pain. Continue physical activities as directed.   Do not smoke.   Avoid drinking alcohol.   Only take over-the-counter or prescription medicine for pain, discomfort, or fever as directed by your caregiver.   Follow your caregiver's suggestions for further testing if your chest pain does not go away.   Keep any follow-up appointments you made. If you do not go to an appointment, you could develop lasting (chronic) problems with pain. If there is any problem keeping an appointment, you must call to reschedule.  SEEK MEDICAL CARE IF:   You think you are having problems from the medicine you are taking. Read your medicine instructions carefully.   Your chest pain does not go away, even after treatment.   You develop a rash with blisters on your chest.  SEEK IMMEDIATE MEDICAL CARE IF:   You have increased chest pain or pain that spreads to your arm, neck, jaw, back, or abdomen.   You develop shortness of breath, an increasing cough, or you are coughing up blood.   You have severe back or abdominal pain, feel nauseous, or vomit.   You develop severe weakness, fainting, or chills.   You have a fever.  THIS IS AN EMERGENCY. Do not wait to see if the pain will go away. Get medical help at once. Call your local emergency services (911 in [place] Do not drive yourself to the hospital. MAKE SURE YOU:   Understand these instructions.     Will watch your condition.   Will get help right away if you are not doing well or get worse.  Document Released: 11/16/2004 Document Revised: 01/26/2011 Document Reviewed: 09/12/2007 [employer] Patient Information 2012 [employer]. 

## 2011-05-22 NOTE — ED Notes (Signed)
PT. ALSO REPORTS MID CHEST PAIN / NON RADIATING ONSET LAST NIGHT .

## 2011-05-22 NOTE — Progress Notes (Signed)
  Echocardiogram Echocardiogram Stress Test has been performed.  [name] A 05/22/2011, 10:33 AM

## 2011-05-22 NOTE — ED Notes (Signed)
Patient brought back to room; was taken to x-ray in triage.

## 2011-05-22 NOTE — ED Provider Notes (Signed)
History    41 year old female with chest pain. Lower sternal/upper abdominal. Onset was around 2100 yesterday. Pain is in the substernal region and feels like an achy pressure. Does not radiate but is complaining of some numbness in her left upper extremity. No fevers or chills. Denies or vomiting. Denies shortness of breath. Denies history of known coronary artery disease but the patient has a history of obesity, high cholesterol and strong family history of cardiac disease. No unusual leg pain or swelling. Denies history of blood clot. Denies trauma.  CSN: [id number]  Arrival date & time 05/22/11  4098   First MD Initiated Contact with Patient 05/22/11 0327      Chief Complaint  Patient presents with  . Stroke Symptoms    (Consider location/radiation/quality/duration/timing/severity/associated sxs/prior treatment) HPI  Past Medical History  Diagnosis Date  . Abdominal pain   . Nausea   . Hernia   . Back pain   . Hyperlipidemia   . Asthma     Past Surgical History  Procedure Date  . Tubal ligation 01/1996  . Colonoscopy 07/2010    Removal of 3 polyps  . Abscess removal 1983    left breast  . Cholecystectomy   . Wisdom tooth extraction     Family History  Problem Relation Age of Onset  . Diabetes Mother   . Heart disease Father   . Cancer Father     kidney and prostate  . Hyperlipidemia Father   . Other Father     gout and clogged arteries  . Hyperlipidemia Sister   . Diabetes Sister   . Diabetes Brother   . Hyperlipidemia Brother     History  Substance Use Topics  . Smoking status: Never Smoker   . Smokeless tobacco: Not on file  . Alcohol Use: No    OB History    Grav Para Term Preterm Abortions TAB SAB Ect Mult Living                  Review of Systems   Review of symptoms negative unless otherwise noted in HPI.   Allergies  Amoxicillin and Sulfa antibiotics  Home Medications   Current Outpatient Rx  Name Route Sig Dispense Refill  .  ACETAMINOPHEN 325 MG PO TABS Oral Take 650 mg by mouth every 6 (six) hours as needed. For pain    . CETIRIZINE HCL 10 MG PO TABS Oral Take 10 mg by mouth daily.      . CYCLOBENZAPRINE HCL 10 MG PO TABS Oral Take 10 mg by mouth 3 (three) times daily as needed. For muscle spasms    . DIPHENHYDRAMINE HCL 25 MG PO CAPS Oral Take 25 mg by mouth every 6 (six) hours as needed. For allergy symptoms    . DOCUSATE SODIUM 50 MG PO CAPS Oral Take by mouth 2 (two) times daily.      Marland Kitchen NAPROXEN SODIUM 220 MG PO TABS Oral Take 220 mg by mouth 2 (two) times daily with a meal.    . MIRALAX PO Oral Take 1 packet by mouth daily as needed. For constipation    . PRESCRIPTION MEDICATION Topical Apply 1-2 application topically 4 (four) times daily. Dermatran    . SOLUBLE FIBER/PROBIOTICS PO Oral Take by mouth. Patient also take a fiber multivitamin.     Marland Kitchen RANITIDINE HCL PO Oral Take 1 tablet by mouth daily as needed. For acid reflux    . ROSUVASTATIN CALCIUM 5 MG PO TABS Oral Take 5 mg by  mouth daily.      . TRAMADOL HCL 50 MG PO TABS Oral Take 50 mg by mouth every 8 (eight) hours as needed. For pain    . VITAMIN D (ERGOCALCIFEROL) 50000 UNITS PO CAPS Oral Take 50,000 Units by mouth 2 (two) times a week. Tuesday & friday    . ALBUTEROL SULFATE HFA 108 (90 BASE) MCG/ACT IN AERS Inhalation Inhale 2 puffs into the lungs every 6 (six) hours as needed. For shortness of breath      BP 130/88  Pulse 79  Temp(Src) 97.7 F (36.5 C) (Oral)  Resp 16  SpO2 95%  LMP 04/18/2011  Physical Exam  Nursing note and vitals reviewed. Constitutional: She appears well-developed and well-nourished. No distress.  HENT:  Head: Normocephalic and atraumatic.  Eyes: Conjunctivae are normal. Right eye exhibits no discharge. Left eye exhibits no discharge.  Neck: Neck supple.  Cardiovascular: Normal rate, regular rhythm and normal heart sounds.  Exam reveals no gallop and no friction rub.   No murmur heard. Pulmonary/Chest: Effort normal  and breath sounds normal. No respiratory distress. She exhibits no tenderness.  Abdominal: Soft. She exhibits no distension. There is no guarding (easily reducible ventral hernia).  Musculoskeletal: She exhibits no edema and no tenderness.  Neurological: She is alert.  Skin: Skin is warm and dry. She is not diaphoretic.  Psychiatric: She has a normal mood and affect. Her behavior is normal. Thought content normal.    ED Course  Procedures (including critical care time)  Labs Reviewed  DIFFERENTIAL - Abnormal; Notable for the following:    Lymphocytes Relative 49 (*)    All other components within normal limits  COMPREHENSIVE METABOLIC PANEL - Abnormal; Notable for the following:    Glucose, Bld 106 (*)    Total Bilirubin 0.2 (*)    All other components within normal limits  CBC  POCT I-STAT TROPONIN I   Dg Chest 2 View  05/22/2011  *RADIOLOGY REPORT*  Clinical Data: Mid chest pain.  Left-sided numbness.  CHEST - 2 VIEW  Comparison: None.  Findings: The heart size and pulmonary vascularity are normal. The lungs appear clear and expanded without focal air space disease or consolidation. No blunting of the costophrenic angles.  No pneumothorax.  IMPRESSION: No evidence of active pulmonary disease.  Original Report Authenticated By: Marlon Pel, M.D.   EKG:  Rhythm: Normal sinus rhythm Rate: 84 Axis: Normal axis Intervals: Normal ST segments: Normal Comparison: none provided   1. Chest pain   2. Hernia, abdominal       MDM  41 year old female with chest pain. Somewhat atypical given relatively constant since onset. Patient denies history of coronary disease but does have some risk factors including obesity, hyperlipidemia and family history. Initial EKG provocative. Initial troponin is normal. Feel that patient is appropriate candidate for the CDU chest pain protocol. Patient has a BMI of around 34-35. Because of this, will forgo CT coronaries and obtain stress test.    Raeford Razor, MD 05/24/11 2155

## 2011-05-22 NOTE — ED Notes (Signed)
PT. REPORTS MID ABDOMINAL PAIN WITH SOB AND NAUSEA ONSET 9 PM LAST NIGHT , DENIES VOMITTING OR DIAPHORESIS , ALSO REPORTS ABDOMINAL SWELLING -SCHEDULED FOR HERNIA SURGERY ON April 22,2013 BY DR. Lindie Spruce.

## 2011-05-22 NOTE — ED Notes (Signed)
Transported for stress echo

## 2011-05-22 NOTE — ED Provider Notes (Signed)
7:46 AM Patient placed on the chest pain protocol in the CDU. Patient reports she began having chest pain yesterday morning. States pain was left-sided and had a "funny feeling" in her left arm and left face. States pain has been constant without waxing and waning. Associated with mild nausea. Patient also states that she is due for a hernia surgery on June 12, 2011. Reports pain is worse was a 6/10. States after receiving aspirin pain is decreased to a 1/10. Describes pain as a sharp pain in the left chest. Denies shortness of breath. Patient is low risk for coronary artery disease. Risk factors include obesity and hypercholesterolemia. Patient denies family history of early heart disease before age 19, smoking, hypertension, diabetes, or known coronary disease. Patient also denies ataxia, aphasia, or weakness. Patient is preparing to go to Stress Echo.  12:22 PM Stress Echo is Normal. Patient now states that her pain in her ventral hernia is worse than her CP. Palpated abdomen. Hernia is reducible mildly tender to palpation but not firm. Advised close followup with Dr. Lindie Spruce close followup primary care physician regarding chest pain. I do however feel that hernia cause referred pain to left chest versus mild acid reflux. Will discharge patient with pain medication and nausea medication. Patient denies vomiting or fevers here in the ED. I spoke with Dr. Dixon Boos nurse followup. She states she will contact the patient. Also spoke with Dr. Allyne Gee he states she will also contact the patient for close primary care followup. Discussed this with patient and family who agree with plan and ready for discharge. Advised return to the emergency department for worsening symptoms such as shortness of breath or worsening chest pain.   Thomasene Lot, [name] 05/22/11 1234

## 2011-05-22 NOTE — ED Notes (Signed)
Pt requesting [facility] be contacted in regards to pt's hernia repair scheduled in late April. Pt wants to be sure it is ok to leave hospital.

## 2011-05-22 NOTE — ED Notes (Signed)
Registration at bedside.

## 2011-05-22 NOTE — Telephone Encounter (Signed)
Bridgett, PA in [facility] ER, calling to report pt of Dr. Lindie Spruce was seen there for chest pain over weekend.  Cardiac cleared as negative.  She is scheduled for ventral hernia repair, but needs "close follow-up" until then.  Will notify MD of same.

## 2011-05-22 NOTE — ED Notes (Addendum)
Patient complaining of left sided head pain, and left sided chest pain that radiates into her left arm since 9 am yesterday morning.  Patient reports that entire left side of her body "felt funny" (reports numbness/tingling), trouble lifting her left eye lid and a stuttering episode that started early yesterday morning.  Patient denies the "funny feeling" on her left side now; states that the feeling comes and goes.  Patient states that she began to have these "episodes" Friday, but the symptoms became worse yesterday.  Patient now describes her chest pain as "tightness"; rates pain 6/10 on the numerical pain scale.  Location of chest pain is "left-sided".  Patient alert and oriented x4; PERRL present. Hand grips and foot pushes are bilaterally equal and strong.  No facial droop or slurred speech noted.  Last seen normal 9 am; patient not showing any signs/symptoms of stroke (symptoms have resolved at this time).

## 2011-05-22 NOTE — Progress Notes (Signed)
Observation review is complete. 

## 2011-05-22 NOTE — ED Notes (Signed)
Dr. Kohut at bedside 

## 2011-05-22 NOTE — ED Provider Notes (Signed)
Medical screening examination/treatment/procedure(s) were performed by non-physician practitioner and as supervising physician I was immediately available for consultation/collaboration.   Nat Christen, MD 05/22/11 [phone]

## 2011-05-24 ENCOUNTER — Encounter: Payer: BC Managed Care – PPO | Attending: Internal Medicine | Admitting: *Deleted

## 2011-05-24 ENCOUNTER — Encounter: Payer: Self-pay | Admitting: *Deleted

## 2011-05-24 VITALS — Ht 69.0 in | Wt 233.1 lb

## 2011-05-24 DIAGNOSIS — Z713 Dietary counseling and surveillance: Secondary | ICD-10-CM | POA: Insufficient documentation

## 2011-05-24 DIAGNOSIS — E7800 Pure hypercholesterolemia, unspecified: Secondary | ICD-10-CM

## 2011-05-24 DIAGNOSIS — K5730 Diverticulosis of large intestine without perforation or abscess without bleeding: Secondary | ICD-10-CM | POA: Insufficient documentation

## 2011-05-24 DIAGNOSIS — E785 Hyperlipidemia, unspecified: Secondary | ICD-10-CM | POA: Insufficient documentation

## 2011-05-24 NOTE — Progress Notes (Signed)
  Medical Nutrition Therapy:  Appt start time: 1030 end time:  1130.   Assessment:  Primary concerns today: Hypercholesterolemia, diverticulosis. Patient has history of weight loss 2 years ago using [employer]. She is aware of healthy foods and portion control, but needs specific direction on incorporating changes into her diet. She has had a back injury, which limits mobility and a hernia with surgery scheduled for April 22. She reports GI discomfort with certain foods, including caffeine, acidic foods, beef, and high fat foods. Her main concern is binge eating during the afternoons due to boredom, when she eats cookies, pie, and ice cream.   MEDICATIONS: Colace, Miralax, Probiotic, Crestor, Vitamin D.    DIETARY INTAKE:  Usual eating pattern includes 3 meals and 2 snacks per day.  Everyday foods include grilled chicken, yogurt, oatmeal/cereal, cookies.  Avoided foods include caffeine, acidic foods, beef, fried foods.    24-hr recall:  B ( AM): Probriotic yogurt, cereal (Honey Nut Cheerios, Fruit Loops, Raisin Bran, Special K yogurt) or oatmeal with Splenda and cranberries, skim milk, 2x/mo sausage/eggs/grits/biscuit and jelly Snk ( AM): None  L ( PM): Eats out, grilled chicken sandwich with fries or grilled chicken salad, once weekly [employer] (Pancakes, syrup, bacon, egg whites) Snk ( PM): Cookies (7-10), ice cream, sherbet, popcorn, grazing D ( PM): Sometimes skips, chicken/ground [place], rice/potatoes, green beans, goes out on Sundays grilled chicken salad, grilled chicken and mashed potatoes Snk ( PM): smoothies with carrots Beverages: water with flavoring packets, no acidic drinks/caffeine, 12-16 oz ginger ale/Sprite, diet Dr. Reino Kent  Usual physical activity: Limited due to back injury, may be able to walk  Estimated energy needs: 1500 calories 170 g carbohydrates 112 g protein 42 g fat  Progress Towards Goal(s):  In progress.   Nutritional Diagnosis:  NB-1.1 Food and  nutrition-related knowledge deficit As related to high cholesterol, high fiber.  As evidenced by diet high in high fat desserts and low in fiber.    Intervention:  Nutrition counseling. Discussed consuming lower fat foods and eating healthy fats/reducing saturated and trans fats. We also discussed high fiber sources and calorie reduction strategies to promote weight loss.   Goals: 1. 1500 kcal diet to promote weight loss of 1 pound/week.  2. Reduce afternoon snacking, monitor portion size of unhealthy snacks and choose healthy snacks (fruit, PB and crackers, yogurt, sugar-free jello/pudding) more often.  3. Switch to diet soda.  4. Chair aerobics 3 days/week.   Handouts given during visit include:  Heart Healthy Nutrition Therapy  Yellow carb counting handout  5 day 1500 kcal meal plan  Monitoring/Evaluation:  Dietary intake, exercise, cholesterol, and body weight in 2 month(s).

## 2011-05-24 NOTE — Patient Instructions (Signed)
Goals: 1. 1500 kcal diet to promote weight loss of 1 pound/week.  2. Reduce afternoon snacking, monitor portion size of unhealthy snacks and choose healthy snacks (fruit, PB and crackers, yogurt, sugar-free jello/pudding) more often.  3. Switch to diet soda.  4. Chair aerobics 3 days/week.

## 2011-05-26 ENCOUNTER — Encounter (HOSPITAL_COMMUNITY): Payer: Self-pay | Admitting: Pharmacy Technician

## 2011-06-03 NOTE — Pre-Procedure Instructions (Signed)
20 Shikita B Friese  06/03/2011   Your procedure is scheduled on:  Mon, April 22 @ 0930  Report to [facility] at 0730 AM.  Call this number if you have problems the morning of surgery: [phone]   Remember:   Do not eat food:After Midnight.  May have clear liquids: up to 4 Hours before arrival.(until 3:30 am)  Clear liquids include soda, tea, black coffee, apple or grape juice, broth,water  Take these medicines the morning of surgery with A SIP OF WATER: Tramadol(if needed),Zantac,Zyrtec,and Albuterol<Bring Your Inhaler With You>   Do not wear jewelry, make-up or nail polish.  Do not wear lotions, powders, or perfumes.   Do not shave 48 hours prior to surgery.  Do not bring valuables to the hospital.  Contacts, dentures or bridgework may not be worn into surgery.  Leave suitcase in the car. After surgery it may be brought to your room.  For patients admitted to the hospital, checkout time is 11:00 AM the day of discharge.   Patients discharged the day of surgery will not be allowed to drive home.    Special Instructions: CHG Shower Use Special Wash: 1/2 bottle night before surgery and 1/2 bottle morning of surgery.   Please read over the following fact sheets that you were given: Pain Booklet, Coughing and Deep Breathing, MRSA Information and Surgical Site Infection Prevention

## 2011-06-05 ENCOUNTER — Encounter (HOSPITAL_COMMUNITY): Payer: Self-pay

## 2011-06-05 ENCOUNTER — Encounter (HOSPITAL_COMMUNITY)
Admission: RE | Admit: 2011-06-05 | Discharge: 2011-06-05 | Disposition: A | Discharge status: Home / Self Care | Payer: BC Managed Care – PPO | Source: Ambulatory Visit | Attending: General Surgery | Admitting: General Surgery

## 2011-06-05 HISTORY — DX: Myoneural disorder, unspecified: G70.9

## 2011-06-05 HISTORY — DX: Rash and other nonspecific skin eruption: R21

## 2011-06-05 HISTORY — DX: Angina pectoris, unspecified: I20.9

## 2011-06-05 LAB — BASIC METABOLIC PANEL
Calcium: 9.5 mg/dL (ref 8.4–10.5)
GFR calc Af Amer: >90 mL/min (ref >90)
GFR calc non Af Amer: >90 mL/min (ref >90)
Glucose, Bld: 121 mg/dL — ABNORMAL HIGH (ref 70–99)
Potassium: 4 mEq/L (ref 3.5–5.1)
Sodium: 141 mEq/L (ref 135–145)

## 2011-06-05 LAB — HCG, SERUM, QUALITATIVE: Preg, Serum: NEGATIVE

## 2011-06-05 LAB — CBC
MCH: 27.6 pg (ref 26.0–34.0)
Platelets: 192 10*3/uL (ref 150–400)
RBC: 4.82 MIL/uL (ref 3.87–5.11)

## 2011-06-05 LAB — SURGICAL PCR SCREEN: MRSA, PCR: NEGATIVE

## 2011-06-11 MED ORDER — CIPROFLOXACIN IN D5W 400 MG/200ML IV SOLN
400.0000 mg | INTRAVENOUS | Status: AC
  Administered 2011-06-12: 400 mg via INTRAVENOUS
  Filled 2011-06-11: qty 200

## 2011-06-12 ENCOUNTER — Ambulatory Visit (HOSPITAL_COMMUNITY)
Admission: RE | Admit: 2011-06-12 | Discharge: 2011-06-12 | Disposition: A | Discharge status: Home / Self Care | Payer: BC Managed Care – PPO | Source: Ambulatory Visit | Attending: General Surgery | Admitting: General Surgery

## 2011-06-12 ENCOUNTER — Encounter (HOSPITAL_COMMUNITY): Payer: Self-pay | Admitting: Surgery

## 2011-06-12 ENCOUNTER — Ambulatory Visit (HOSPITAL_COMMUNITY): Payer: BC Managed Care – PPO | Admitting: Anesthesiology

## 2011-06-12 ENCOUNTER — Encounter (HOSPITAL_COMMUNITY)
Admission: RE | Disposition: A | Discharge status: Home / Self Care | Payer: Self-pay | Source: Ambulatory Visit | Attending: General Surgery

## 2011-06-12 ENCOUNTER — Encounter (HOSPITAL_COMMUNITY): Payer: Self-pay | Admitting: Anesthesiology

## 2011-06-12 ENCOUNTER — Telehealth (INDEPENDENT_AMBULATORY_CARE_PROVIDER_SITE_OTHER): Payer: Self-pay | Admitting: General Surgery

## 2011-06-12 DIAGNOSIS — J45909 Unspecified asthma, uncomplicated: Secondary | ICD-10-CM | POA: Insufficient documentation

## 2011-06-12 DIAGNOSIS — Z01812 Encounter for preprocedural laboratory examination: Secondary | ICD-10-CM | POA: Insufficient documentation

## 2011-06-12 DIAGNOSIS — K432 Incisional hernia without obstruction or gangrene: Secondary | ICD-10-CM

## 2011-06-12 DIAGNOSIS — E785 Hyperlipidemia, unspecified: Secondary | ICD-10-CM | POA: Insufficient documentation

## 2011-06-12 DIAGNOSIS — I209 Angina pectoris, unspecified: Secondary | ICD-10-CM | POA: Insufficient documentation

## 2011-06-12 HISTORY — PX: FRACTURE SURGERY: SHX138

## 2011-06-12 HISTORY — PX: VENTRAL HERNIA REPAIR: SHX424

## 2011-06-12 SURGERY — REPAIR, HERNIA, VENTRAL, LAPAROSCOPIC
Anesthesia: General | Site: Abdomen | Wound class: Clean

## 2011-06-12 MED ORDER — FENTANYL CITRATE 0.05 MG/ML IJ SOLN
INTRAMUSCULAR | Status: DC | PRN
  Administered 2011-06-12: 25 ug via INTRAVENOUS
  Administered 2011-06-12: 50 ug via INTRAVENOUS
  Administered 2011-06-12 (×3): 100 ug via INTRAVENOUS
  Administered 2011-06-12: 50 ug via INTRAVENOUS

## 2011-06-12 MED ORDER — LACTATED RINGERS IV SOLN
INTRAVENOUS | Status: DC | PRN
  Administered 2011-06-12 (×2): via INTRAVENOUS

## 2011-06-12 MED ORDER — MIDAZOLAM HCL 2 MG/2ML IJ SOLN: 1.0000 mg | INTRAMUSCULAR | Status: DC | PRN

## 2011-06-12 MED ORDER — MIDAZOLAM HCL 5 MG/5ML IJ SOLN
INTRAMUSCULAR | Status: DC | PRN
  Administered 2011-06-12: 2 mg via INTRAVENOUS

## 2011-06-12 MED ORDER — 0.9 % SODIUM CHLORIDE (POUR BTL) OPTIME
TOPICAL | Status: DC | PRN
  Administered 2011-06-12: 1000 mL

## 2011-06-12 MED ORDER — HYDROCODONE-ACETAMINOPHEN 5-500 MG PO TABS: 1.0000 | ORAL_TABLET | Freq: Four times a day (QID) | ORAL | Status: DC | PRN

## 2011-06-12 MED ORDER — ONDANSETRON HCL 4 MG/2ML IJ SOLN
INTRAMUSCULAR | Status: DC | PRN
  Administered 2011-06-12 (×2): 4 mg via INTRAVENOUS

## 2011-06-12 MED ORDER — ROCURONIUM BROMIDE 100 MG/10ML IV SOLN
INTRAVENOUS | Status: DC | PRN
  Administered 2011-06-12: 50 mg via INTRAVENOUS

## 2011-06-12 MED ORDER — LORAZEPAM 2 MG/ML IJ SOLN: 1.0000 mg | Freq: Once | INTRAMUSCULAR | Status: DC | PRN

## 2011-06-12 MED ORDER — HYDROCODONE-ACETAMINOPHEN 5-325 MG PO TABS
1.0000 | ORAL_TABLET | Freq: Four times a day (QID) | ORAL | Status: AC | PRN
  Administered 2011-06-12: 1 via ORAL

## 2011-06-12 MED ORDER — NEOSTIGMINE METHYLSULFATE 1 MG/ML IJ SOLN
INTRAMUSCULAR | Status: DC | PRN
  Administered 2011-06-12: 5 mg via INTRAVENOUS

## 2011-06-12 MED ORDER — BUPIVACAINE-EPINEPHRINE 0.25% -1:200000 IJ SOLN
INTRAMUSCULAR | Status: DC | PRN
  Administered 2011-06-12: 10 mL

## 2011-06-12 MED ORDER — PROPOFOL 10 MG/ML IV EMUL
INTRAVENOUS | Status: DC | PRN
  Administered 2011-06-12: 200 mg via INTRAVENOUS

## 2011-06-12 MED ORDER — FENTANYL CITRATE 0.05 MG/ML IJ SOLN: 50.0000 ug | INTRAMUSCULAR | Status: DC | PRN

## 2011-06-12 MED ORDER — GLYCOPYRROLATE 0.2 MG/ML IJ SOLN
INTRAMUSCULAR | Status: DC | PRN
  Administered 2011-06-12: .8 mg via INTRAVENOUS

## 2011-06-12 MED ORDER — SODIUM CHLORIDE 0.9 % IR SOLN
Status: DC | PRN
  Administered 2011-06-12: 10:00:00

## 2011-06-12 MED ORDER — LACTATED RINGERS IV SOLN
INTRAVENOUS | Status: DC
  Administered 2011-06-12: 09:00:00 via INTRAVENOUS

## 2011-06-12 MED ORDER — SODIUM CHLORIDE 0.9 % IR SOLN
Status: DC | PRN
  Administered 2011-06-12: 1000 mL

## 2011-06-12 MED ORDER — HYDROMORPHONE HCL PF 1 MG/ML IJ SOLN
0.2500 mg | INTRAMUSCULAR | Status: DC | PRN
  Administered 2011-06-12 (×2): 0.5 mg via INTRAVENOUS

## 2011-06-12 SURGICAL SUPPLY — 53 items
ADH SKN CLS APL DERMABOND .7 (GAUZE/BANDAGES/DRESSINGS) ×1
APPLIER CLIP LOGIC TI 5 (MISCELLANEOUS) IMPLANT
APPLIER CLIP ROT 10 11.4 M/L (STAPLE)
APR CLP MED LRG 11.4X10 (STAPLE)
APR CLP MED LRG 33X5 (MISCELLANEOUS)
BAG DECANTER FOR FLEXI CONT (MISCELLANEOUS) ×2 IMPLANT
BINDER ABD UNIV 12 45-62 (WOUND CARE) IMPLANT
BINDER ABDOMINAL 46IN 62IN (WOUND CARE) ×2
CANISTER SUCTION 2500CC (MISCELLANEOUS) ×1 IMPLANT
CHLORAPREP W/TINT 26ML (MISCELLANEOUS) ×2 IMPLANT
CLIP APPLIE ROT 10 11.4 M/L (STAPLE) IMPLANT
CLOTH BEACON ORANGE TIMEOUT ST (SAFETY) ×2 IMPLANT
COVER SURGICAL LIGHT HANDLE (MISCELLANEOUS) ×1 IMPLANT
DECANTER SPIKE VIAL GLASS SM (MISCELLANEOUS) ×1 IMPLANT
DERMABOND ADVANCED (GAUZE/BANDAGES/DRESSINGS) ×1
DERMABOND ADVANCED .7 DNX12 (GAUZE/BANDAGES/DRESSINGS) ×1 IMPLANT
DEVICE SECURE STRAP 25 ABSORB (INSTRUMENTS) ×4 IMPLANT
DEVICE TROCAR PUNCTURE CLOSURE (ENDOMECHANICALS) ×2 IMPLANT
DISSECTOR BLUNT TIP ENDO 5MM (MISCELLANEOUS) IMPLANT
DRAPE UTILITY 15X26 W/TAPE STR (DRAPE) ×4 IMPLANT
ELECT REM PT RETURN 9FT ADLT (ELECTROSURGICAL) ×2
ELECTRODE REM PT RTRN 9FT ADLT (ELECTROSURGICAL) ×1 IMPLANT
GLOVE BIO SURGEON STRL SZ7 (GLOVE) ×3 IMPLANT
GLOVE BIOGEL PI IND STRL 7.0 (GLOVE) IMPLANT
GLOVE BIOGEL PI IND STRL 7.5 (GLOVE) IMPLANT
GLOVE BIOGEL PI IND STRL 8 (GLOVE) ×1 IMPLANT
GLOVE BIOGEL PI INDICATOR 7.0 (GLOVE) ×1
GLOVE BIOGEL PI INDICATOR 7.5 (GLOVE) ×1
GLOVE BIOGEL PI INDICATOR 8 (GLOVE) ×1
GLOVE ECLIPSE 7.5 STRL STRAW (GLOVE) ×2 IMPLANT
GLOVE SURG SS PI 6.5 STRL IVOR (GLOVE) ×1 IMPLANT
GOWN STRL NON-REIN LRG LVL3 (GOWN DISPOSABLE) ×5 IMPLANT
KIT BASIN OR (CUSTOM PROCEDURE TRAY) ×2 IMPLANT
KIT ROOM TURNOVER OR (KITS) ×2 IMPLANT
MESH PHYSIO OVAL 10X15CM (Mesh General) ×1 IMPLANT
NDL SPNL 22GX3.5 QUINCKE BK (NEEDLE) ×1 IMPLANT
NEEDLE SPNL 22GX3.5 QUINCKE BK (NEEDLE) ×2 IMPLANT
NS IRRIG 1000ML POUR BTL (IV SOLUTION) ×2 IMPLANT
PAD ARMBOARD 7.5X6 YLW CONV (MISCELLANEOUS) ×4 IMPLANT
PEN SKIN MARKING BROAD (MISCELLANEOUS) ×2 IMPLANT
SET IRRIG TUBING LAPAROSCOPIC (IRRIGATION / IRRIGATOR) ×1 IMPLANT
SLEEVE ENDOPATH XCEL 5M (ENDOMECHANICALS) ×1 IMPLANT
STAPLER VISISTAT 35W (STAPLE) ×1 IMPLANT
SUT MNCRL AB 4-0 PS2 18 (SUTURE) ×2 IMPLANT
SUT NOVA 0 T19/GS 22DT (SUTURE) ×4 IMPLANT
SUT VICRYL 0 TIES 12 18 (SUTURE) IMPLANT
SUT VICRYL 0 UR6 27IN ABS (SUTURE) IMPLANT
TOWEL OR 17X24 6PK STRL BLUE (TOWEL DISPOSABLE) ×2 IMPLANT
TOWEL OR 17X26 10 PK STRL BLUE (TOWEL DISPOSABLE) ×2 IMPLANT
TRAY FOLEY CATH 14FRSI W/METER (CATHETERS) ×2 IMPLANT
TRAY LAPAROSCOPIC (CUSTOM PROCEDURE TRAY) ×2 IMPLANT
TROCAR XCEL NON-BLD 11X100MML (ENDOMECHANICALS) ×3 IMPLANT
TROCAR XCEL NON-BLD 5MMX100MML (ENDOMECHANICALS) ×3 IMPLANT

## 2011-06-12 NOTE — H&P (Signed)
Patient ID: [name], female   DOB: [birth year], 41 y.o.   MRN: [id number]    Chief Complaint   Patient presents with   .  Umbilical Hernia       est pt- eval peri-umb hernia      HPI [name] is a 41 y.o. female.  With peri-umbilical hernia near GB incision site HPI    Past Medical History   Diagnosis  Date   .  Abdominal pain     .  Nausea     .  Hernia     .  Back pain     .  Hyperlipidemia     .  Asthma         Past Surgical History   Procedure  Date   .  Tubal ligation  01/1996   .  Colonoscopy  07/2010       Removal of 3 polyps   .  Abscess removal  1983       left breast   .  Cholecystectomy     .  Wisdom tooth extraction         Family History   Problem  Relation  Age of Onset   .  Diabetes  Mother     .  Heart disease  Father     .  Cancer  Father         kidney and prostate   .  Hyperlipidemia  Father     .  Other  Father         gout and clogged arteries   .  Hyperlipidemia  Sister     .  Diabetes  Sister     .  Diabetes  Brother     .  Hyperlipidemia  Brother        Social History History   Substance Use Topics   .  Smoking status:  Never Smoker    .  Smokeless tobacco:  Not on file   .  Alcohol Use:  No       Allergies   Allergen  Reactions   .  Amoxicillin  Hives   .  Sulfa Antibiotics  Hives       Current Outpatient Prescriptions   Medication  Sig  Dispense  Refill   .  albuterol (PROVENTIL HFA;VENTOLIN HFA) 108 (90 BASE) MCG/ACT inhaler  Inhale 2 puffs into the lungs every 6 (six) hours as needed.         .  cetirizine (ZYRTEC) 10 MG tablet  Take 10 mg by mouth daily.           .  cyclobenzaprine (FLEXERIL) 10 MG tablet  Take 10 mg by mouth 3 (three) times daily as needed.         .  docusate sodium (COLACE) 50 MG capsule  Take by mouth 2 (two) times daily.           .  fluconazole (DIFLUCAN) 150 MG tablet  1 tab po each week for the next 4 weeks   4 tablet   0   .  naproxen sodium (ANAPROX) 220 MG tablet  Take  220 mg by mouth 2 (two) times daily with a meal.         .  Polyethylene Glycol 3350 (MIRALAX PO)  Take by mouth daily.           .  Probiotic Product (SOLUBLE FIBER/PROBIOTICS PO)  Take by mouth. Patient also take  a fiber multivitamin.          Marland Kitchen  RANITIDINE HCL PO  Take by mouth as needed.         .  rosuvastatin (CRESTOR) 5 MG tablet  Take 5 mg by mouth daily.           .  traMADol (ULTRAM) 50 MG tablet  as needed.            Review of Systems Review of Systems  Constitutional: Negative.   HENT: Negative.   Respiratory: Negative.   Cardiovascular: Negative.   Gastrointestinal: Positive for abdominal pain (periumbilical and LUQ, same as prior to GB surgery).  Genitourinary: Negative.   Musculoskeletal: Negative.  Back pain: fall last year.  Skin: Negative.   Hematological: Negative.   Psychiatric/Behavioral: Negative.     Blood pressure 132/88, pulse 80, temperature 97.8 F (36.6 C), temperature source Temporal, resp. rate 16, height 5\' 9"  (1.753 m), weight 231 lb 12.8 oz (105.144 kg).   Physical Exam Physical Exam  Constitutional: She is oriented to person, place, and time. She appears well-developed and well-nourished.  HENT:   Head: Normocephalic and atraumatic.  Eyes: Conjunctivae and EOM are normal. Pupils are equal, round, and reactive to light.  Neck: Normal range of motion. Neck supple.  Cardiovascular: Normal rate and regular rhythm.   Pulmonary/Chest: Effort normal and breath sounds normal.  Abdominal: There is tenderness (at herna site).    Musculoskeletal: Normal range of motion.  Neurological: She is alert and oriented to person, place, and time. She has normal reflexes.  Skin: Skin is warm.  Psychiatric: She has a normal mood and affect.    Data Reviewed CT scan showing hernia   Assessment Incisional/peri-umbilical hernia   Plan Laparoscopic repair with mesh       Cambri Plourde III,Giancarlo Askren O

## 2011-06-12 NOTE — Transfer of Care (Signed)
Immediate Anesthesia Transfer of Care Note  Patient: [name]  Procedure(s) Performed: Procedure(s) (LRB): LAPAROSCOPIC VENTRAL HERNIA (N/A)  Patient Location: PACU  Anesthesia Type: General  Level of Consciousness: awake and oriented  Airway & Oxygen Therapy: Patient Spontanous Breathing and Patient connected to face mask  Post-op Assessment: Report given to PACU RN and Patient moving all extremities X 4  Post vital signs: Reviewed and stable  Complications: No apparent anesthesia complications

## 2011-06-12 NOTE — Anesthesia Preprocedure Evaluation (Addendum)
Anesthesia Evaluation  Patient identified by MRN, date of birth, ID band Patient awake    Reviewed: Allergy & Precautions, H&P , NPO status , Patient's Chart, lab work & pertinent test results  Airway Mallampati: II TM Distance: >3 FB Neck ROM: Full    Dental   Pulmonary asthma ,    Pulmonary exam normal       Cardiovascular + angina     Neuro/Psych  Neuromuscular disease    GI/Hepatic   Endo/Other    Renal/GU      Musculoskeletal   Abdominal (+) + obese,   Peds  Hematology   Anesthesia Other Findings   Reproductive/Obstetrics                           Anesthesia Physical Anesthesia Plan  ASA: III  Anesthesia Plan: General   Post-op Pain Management:    Induction: Intravenous  Airway Management Planned: Oral ETT  Additional Equipment:   Intra-op Plan:   Post-operative Plan: Extubation in OR  Informed Consent: I have reviewed the patients History and Physical, chart, labs and discussed the procedure including the risks, benefits and alternatives for the proposed anesthesia with the patient or authorized representative who has indicated his/her understanding and acceptance.     Plan Discussed with: CRNA and Surgeon  Anesthesia Plan Comments:         Anesthesia Quick Evaluation

## 2011-06-12 NOTE — Preoperative (Signed)
Beta Blockers   Reason not to administer Beta Blockers:Not Applicable 

## 2011-06-12 NOTE — Discharge Instructions (Signed)
[facility] _______[facility]  UMBILICAL OR INGUINAL HERNIA REPAIR: POST OP INSTRUCTIONS  Always review your discharge instruction sheet given to you by the facility where your surgery was performed. IF YOU HAVE DISABILITY OR FAMILY LEAVE FORMS, YOU MUST BRING THEM TO THE OFFICE FOR PROCESSING.   DO NOT GIVE THEM TO YOUR DOCTOR.  1. A  prescription for pain medication may be given to you upon discharge.  Take your pain medication as prescribed, if needed.  If narcotic pain medicine is not needed, then you may take acetaminophen (Tylenol) or ibuprofen (Advil) as needed. 2. Take your usually prescribed medications unless otherwise directed. 3. If you need a refill on your pain medication, please contact your pharmacy.  They will contact our office to request authorization. Prescriptions will not be filled after 5 pm or on week-ends. 4. You should follow a light diet the first 24 hours after arrival home, such as soup and crackers, etc.  Be sure to include lots of fluids daily.  Resume your normal diet the day after surgery. 5. Most patients will experience some swelling and bruising around the umbilicus or in the groin and scrotum.  Ice packs and reclining will help.  Swelling and bruising can take several days to resolve.  6. It is common to experience some constipation if taking pain medication after surgery.  Increasing fluid intake and taking a stool softener (such as Colace) will usually help or prevent this problem from occurring.  A mild laxative (Milk of Magnesia or Miralax) should be taken according to package directions if there are no bowel movements after 48 hours. 7. Unless discharge instructions indicate otherwise, you may remove your bandages 24-48 hours after surgery, and you may shower at that time.  You may have steri-strips (small skin tapes) in place directly over the incision.  These strips should be left on the skin for 7-10 days.  If your surgeon used skin glue on the  incision, you may shower in 24 hours.  The glue will flake off over the next 2-3 weeks.  Any sutures or staples will be removed at the office during your follow-up visit. 8. ACTIVITIES:  You may resume regular (light) daily activities beginning the next day--such as daily self-care, walking, climbing stairs--gradually increasing activities as tolerated.  You may have sexual intercourse when it is comfortable.  Refrain from any heavy lifting or straining until approved by your doctor. a. You may drive when you are no longer taking prescription pain medication, you can comfortably wear a seatbelt, and you can safely maneuver your car and apply brakes. b. RETURN TO WORK:  ____3 weeks after seen at appointment______________________________________________________ 9. You should see your doctor in the office for a follow-up appointment approximately 2-3 weeks after your surgery.  Make sure that you call for this appointment within a day or two after you arrive home to insure a convenient appointment time. 10. OTHER INSTRUCTIONS: Wear abdominal binder at all times. __________________________________________________________________________________________________________________________________________________________________________________________  WHEN TO CALL YOUR DOCTOR: 1. Fever over 101.0 2. Inability to urinate 3. Nausea and/or vomiting 4. Extreme swelling or bruising 5. Continued bleeding from incision. 6. Increased pain, redness, or drainage from the incision  The clinic staff is available to answer your questions during regular business hours.  Please don't hesitate to call and ask to speak to one of the nurses for clinical concerns.  If you have a medical emergency, go to the nearest emergency room or call 911.  A Careers adviser from [facility] is always on call at the hospital   [address], [address], [place], [place]  [address] ?  [address] [address], [place], [place]   [address] [phone] ? [phone] ? FAX [phone] Web site: [email or web address]

## 2011-06-12 NOTE — Op Note (Signed)
OPERATIVE REPORT  DATE OF OPERATION: 06/12/2011  PATIENT:  Manuela Schwartz Abernathy  41 y.o. female  PRE-OPERATIVE DIAGNOSIS:  INCISIONAL HERNIA/Periumbilical  POST-OPERATIVE DIAGNOSIS:  INCISIONAL HERNIA  PROCEDURE:  Procedure(s): LAPAROSCOPIC VENTRAL HERNIA  SURGEON:  Surgeon(s): Cherylynn Ridges, MD  ASSISTANT: None  ANESTHESIA:   general  EBL: <50 ml  BLOOD ADMINISTERED: none  DRAINS: none   SPECIMEN:  No Specimen  COUNTS CORRECT:  YES  PROCEDURE DETAILS: The patient was taken to the operating room and placed on the table in the supine position. After an adequate endotracheal anesthetic was administered she was prepped and draped in usual sterile manner exposing the entire abdomen.  After proper time out was performed identifying the patient and the procedure to be performed a transverse incision approximately 1/2 cm long was made in the left upper quadrant. It was at that site an 11 mm Optiview cannula with attached camera and light source was passed into the peritoneal cavity with minimal difficulty. We subsequently passed a right upper quadrant 1011 mm cannula into lower quadrant of the right 5 mm cannulas under direct vision.   the ventral omentum or bowel. We dissected away the falciform ligament from the upper portion of the hernia prior to placing and a piece of physeal mesh.  We used a spinal needle order to mark the edges of the hernia defect. Approximately 5 cm from and on each side we came up with a piece of mesh measuring 10 x 14 cm in size. We therefore used a 10 x 15 cm piece of physeal mesh.  8 equally spaced peripheral sutures out: Novafil were placed on the mesh prior to passing the mesh into the peritoneal cavity through the 11mm cannula. It was soaked in antibiotic solution. It was passed into the peritoneal cavity and then subsequently the sutures retrieved in the equally space places circumferentially. These were tied to the intra-abdominal wall. We subsequently  used several security strap tackers in order to clean and circumferentially fixated the mesh. This also included an area just below the umbilicus that was covered with mesh.  Irrigated with saline and aspirated all fluid and gas removed and all cannulas.  Cortisone Marcaine was injected at the cannula sites the the left upper quadrant skin site was closed using running subcuticular 4-0 Monocryl. All other sites were closed using Dermabond Steri-Strips and Tegaderms. All needle counts sponge counts and instrument counts were correct.  PATIENT DISPOSITION:  PACU - hemodynamically stable.   Alys Dulak III,Brayden Betters O 4/22/201311:52 AM

## 2011-06-12 NOTE — Anesthesia Postprocedure Evaluation (Signed)
  Anesthesia Post-op Note  Patient: [name]  Procedure(s) Performed: Procedure(s) (LRB): LAPAROSCOPIC VENTRAL HERNIA (N/A)  Patient Location: PACU  Anesthesia Type: General  Level of Consciousness: awake  Airway and Oxygen Therapy: Patient Spontanous Breathing  Post-op Pain: mild  Post-op Assessment: Post-op Vital signs reviewed, Patient's Cardiovascular Status Stable, Respiratory Function Stable, Patent Airway, No signs of Nausea or vomiting and Pain level controlled  Post-op Vital Signs: stable  Complications: No apparent anesthesia complications

## 2011-06-13 ENCOUNTER — Telehealth (INDEPENDENT_AMBULATORY_CARE_PROVIDER_SITE_OTHER): Payer: Self-pay | Admitting: General Surgery

## 2011-06-13 ENCOUNTER — Encounter (HOSPITAL_COMMUNITY): Payer: Self-pay | Admitting: General Surgery

## 2011-06-13 NOTE — Telephone Encounter (Signed)
Pt called and needs post op appt with Dr Lindie Spruce. Pt had Hernia sx can call pt at home [phone]

## 2011-06-19 ENCOUNTER — Telehealth (INDEPENDENT_AMBULATORY_CARE_PROVIDER_SITE_OTHER): Payer: Self-pay | Admitting: General Surgery

## 2011-06-19 NOTE — Telephone Encounter (Signed)
Patient called status post hernia repair. Having a small amount of fluid leaking from incision. She is having some stabbing pain at incision and states it feels "tight." She has appt Wednesday. No fevers. No redness at incision. No nausea or vomiting. Eating ok. Moving her bowels. Fluid is a light red color, small amount. I advised it sounds like a seroma has collected under incision. I advised to keep clean and covered with dry gauze. To use a heating pad to help with the pain and keep appt on Wednesday. Patient to call back if anything worsens prior to follow up appt on Wednesday.

## 2011-06-21 ENCOUNTER — Ambulatory Visit (INDEPENDENT_AMBULATORY_CARE_PROVIDER_SITE_OTHER): Payer: BC Managed Care – PPO | Admitting: General Surgery

## 2011-06-21 ENCOUNTER — Telehealth (INDEPENDENT_AMBULATORY_CARE_PROVIDER_SITE_OTHER): Payer: Self-pay | Admitting: General Surgery

## 2011-06-21 ENCOUNTER — Encounter (INDEPENDENT_AMBULATORY_CARE_PROVIDER_SITE_OTHER): Payer: Self-pay | Admitting: General Surgery

## 2011-06-21 VITALS — BP 144/94 | HR 106 | Temp 98.2°F | Ht 69.5 in | Wt 236.0 lb

## 2011-06-21 DIAGNOSIS — Z09 Encounter for follow-up examination after completed treatment for conditions other than malignant neoplasm: Secondary | ICD-10-CM

## 2011-06-21 MED ORDER — HYDROCODONE-ACETAMINOPHEN 5-500 MG PO TABS: 1.0000 | ORAL_TABLET | Freq: Four times a day (QID) | ORAL | Status: AC | PRN

## 2011-06-21 NOTE — Progress Notes (Signed)
HPI The patient is status post laparoscopic ventral hernia repair. She is doing well although she's had significant discomfort. He has eased up of less or disease.  PE Termination her incisions have healed well with no evidence of infection. She had some drainage of blood underneath her left upper quadrant trocar site wound. The wound itself is healed well with no evidence of dehiscence  Studiy review None  Assessment Doing as expected status post laparoscopic ventral hernia repaired.  Plan She cannot describe back with her physical therapy for her back for at least another month and a half. That is because of the pressure the patient placed on her wall hernia repair. I will see her back in 3 weeks to reassess her wounds and her repair. In the meantime I will refill her pain medication.

## 2011-07-11 ENCOUNTER — Encounter (INDEPENDENT_AMBULATORY_CARE_PROVIDER_SITE_OTHER): Payer: Self-pay | Admitting: General Surgery

## 2011-07-11 ENCOUNTER — Ambulatory Visit (INDEPENDENT_AMBULATORY_CARE_PROVIDER_SITE_OTHER): Payer: BC Managed Care – PPO | Admitting: General Surgery

## 2011-07-11 VITALS — BP 128/86 | Temp 97.3°F | Ht 69.0 in | Wt 238.6 lb

## 2011-07-11 DIAGNOSIS — Z09 Encounter for follow-up examination after completed treatment for conditions other than malignant neoplasm: Secondary | ICD-10-CM

## 2011-07-11 MED ORDER — HYDROCODONE-ACETAMINOPHEN 5-500 MG PO TABS: 1.0000 | ORAL_TABLET | Freq: Four times a day (QID) | ORAL | Status: DC | PRN

## 2011-07-11 NOTE — Progress Notes (Signed)
HPI The patient is doing well status post laparoscopic ventral hernia repair. She still is complaining of left upper quadrant pain at the site of one of her trocar entrances. There is a small amount of ecchymotic bruising around that area but no infection  PE Her wound is healed well with no infection. The hernias were repaired and no recurrence.  Studiy review None  Assessment Doing well status post laparoscopic ventral hernia repair.  Plan The patient is still having some discomfort in her left upper quadrant and therefore will give her 20 Vicodin tablets with no refills.  She returned to full activity in 2 weeks. This includes lifting and exercise related to her physical therapy.  She is to return to see me on a p.r.n. basis.

## 2011-07-19 ENCOUNTER — Ambulatory Visit: Payer: BC Managed Care – PPO | Admitting: *Deleted

## 2011-09-08 ENCOUNTER — Encounter (INDEPENDENT_AMBULATORY_CARE_PROVIDER_SITE_OTHER): Payer: Self-pay

## 2011-10-04 ENCOUNTER — Other Ambulatory Visit: Payer: Self-pay | Admitting: Orthopedic Surgery

## 2011-10-04 DIAGNOSIS — M545 Low back pain, unspecified: Secondary | ICD-10-CM

## 2011-10-07 ENCOUNTER — Ambulatory Visit
Admission: RE | Admit: 2011-10-07 | Discharge: 2011-10-07 | Disposition: A | Discharge status: Home / Self Care | Payer: Worker's Compensation | Source: Ambulatory Visit | Attending: Orthopedic Surgery | Admitting: Orthopedic Surgery

## 2011-10-07 DIAGNOSIS — M545 Low back pain, unspecified: Secondary | ICD-10-CM

## 2011-10-24 ENCOUNTER — Telehealth (INDEPENDENT_AMBULATORY_CARE_PROVIDER_SITE_OTHER): Payer: Self-pay | Admitting: General Surgery

## 2011-10-24 NOTE — Telephone Encounter (Signed)
Patient called post hernia repair on 06/12/2011 seeing if she has any restrictions from surgical standpoint for her physical therapy. I advised patient if surgery was in April she would not have restrictions any more. She asked for a letter to be faxed to Dr Shon Baton stating this. Letter written and faxed per patient request.

## 2012-02-22 ENCOUNTER — Other Ambulatory Visit (HOSPITAL_COMMUNITY): Payer: Self-pay | Admitting: Internal Medicine

## 2012-02-22 ENCOUNTER — Other Ambulatory Visit (HOSPITAL_BASED_OUTPATIENT_CLINIC_OR_DEPARTMENT_OTHER): Payer: Self-pay | Admitting: Internal Medicine

## 2012-02-22 DIAGNOSIS — Z1231 Encounter for screening mammogram for malignant neoplasm of breast: Secondary | ICD-10-CM

## 2012-02-27 ENCOUNTER — Other Ambulatory Visit: Payer: Self-pay | Admitting: Obstetrics and Gynecology

## 2012-02-27 DIAGNOSIS — N63 Unspecified lump in unspecified breast: Secondary | ICD-10-CM

## 2012-03-07 ENCOUNTER — Encounter (INDEPENDENT_AMBULATORY_CARE_PROVIDER_SITE_OTHER): Payer: Self-pay

## 2012-03-18 ENCOUNTER — Ambulatory Visit (HOSPITAL_COMMUNITY): Payer: Self-pay

## 2012-03-18 ENCOUNTER — Ambulatory Visit
Admission: RE | Admit: 2012-03-18 | Discharge: 2012-03-18 | Disposition: A | Discharge status: Home / Self Care | Payer: BC Managed Care – PPO | Source: Ambulatory Visit | Attending: Obstetrics and Gynecology | Admitting: Obstetrics and Gynecology

## 2012-03-18 ENCOUNTER — Ambulatory Visit
Admission: RE | Admit: 2012-03-18 | Discharge: 2012-03-18 | Disposition: A | Discharge status: Home / Self Care | Payer: 59 | Source: Ambulatory Visit | Attending: Obstetrics and Gynecology | Admitting: Obstetrics and Gynecology

## 2012-03-18 DIAGNOSIS — N63 Unspecified lump in unspecified breast: Secondary | ICD-10-CM

## 2012-03-26 ENCOUNTER — Ambulatory Visit (INDEPENDENT_AMBULATORY_CARE_PROVIDER_SITE_OTHER): Payer: 59 | Admitting: General Surgery

## 2012-03-26 ENCOUNTER — Encounter (INDEPENDENT_AMBULATORY_CARE_PROVIDER_SITE_OTHER): Payer: Self-pay | Admitting: General Surgery

## 2012-03-26 VITALS — BP 124/82 | HR 68 | Temp 97.0°F | Resp 12 | Ht 69.0 in | Wt 228.4 lb

## 2012-03-26 DIAGNOSIS — R109 Unspecified abdominal pain: Secondary | ICD-10-CM

## 2012-03-26 NOTE — Progress Notes (Signed)
As part of the patient's rehabilitation for her back she started doing some lifting and immediately felt some abdominal pain around her ventral hernia repair.  On examination today I do not feel any breakdown in her hernia repair although she does have some discomfort in the upper midportion of her wound. At that area there is a slight bulge with coughing but I do not think it is an actual hernia. This area we will keep a 9 on the because of concern of the discomfort I would put her back on no lifting for at least one month. However that does be known to her orthopedic surgeon taking care of her back. I would like to see her again in one month.

## 2012-04-23 ENCOUNTER — Ambulatory Visit (INDEPENDENT_AMBULATORY_CARE_PROVIDER_SITE_OTHER): Payer: BC Managed Care – PPO | Admitting: General Surgery

## 2012-04-23 ENCOUNTER — Encounter (INDEPENDENT_AMBULATORY_CARE_PROVIDER_SITE_OTHER): Payer: Self-pay | Admitting: General Surgery

## 2012-04-23 VITALS — BP 122/84 | HR 68 | Temp 97.0°F | Resp 16 | Ht 69.0 in | Wt 233.0 lb

## 2012-04-23 DIAGNOSIS — K449 Diaphragmatic hernia without obstruction or gangrene: Secondary | ICD-10-CM

## 2012-04-23 NOTE — Progress Notes (Signed)
The patient comes in today with the additional diagnosis of a hiatal hernia. At examination today she does not have an abdominal wall hernia. From my standpoint she is cleared to go back into her physical therapy. Dr. Loreta Ave we need to clear her from a GI standpoint for her to continue with her physical therapy. She can return to see me on an as-needed basis.

## 2012-12-09 IMAGING — CT CT ABDOMEN W/ CM
2 of 5 series · 17 of 46 positions shown, 19 images · IV contrast (30CC OMNI 300 & [ID] OMNI 300)
Comparison: None

CLINICAL DATA: Abdominal pain.  Nausea and constipation.
Umbilical hernia.  553.9.

CT ABDOMEN WITH CONTRAST
TECHNIQUE: Multidetector CT imaging of the abdomen was performed
using the standard protocol following bolus administration of
intravenous contrast.
Contrast: 125mL OMNIPAQUE IOHEXOL 300 MG/ML IV SOLN

[Series 2: abdomen w/ · axial · 0.76mm/px · z∈[-257,+63]mm · 14 of 72 slices shown, 16 images]
[im 4/72  soft-tissue]
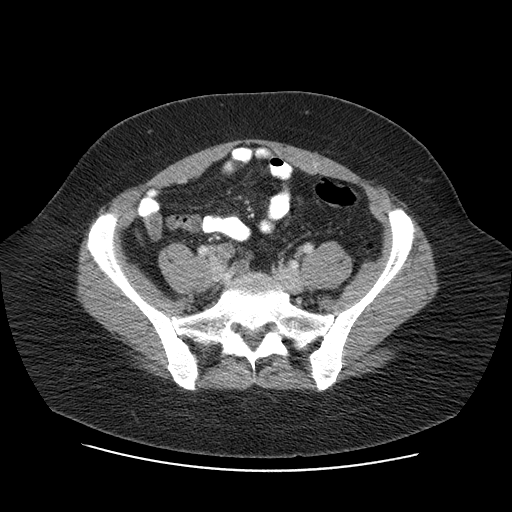
[im 4/72  bone]
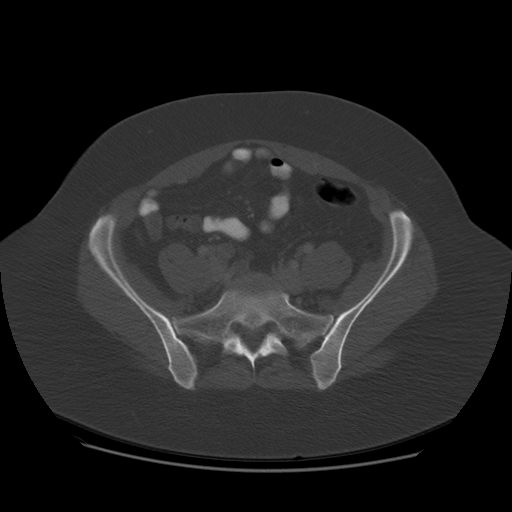
[im 8/72  soft-tissue]
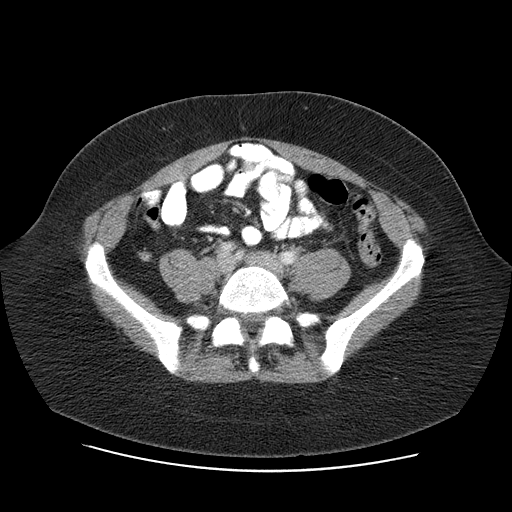
[im 16/72  soft-tissue]
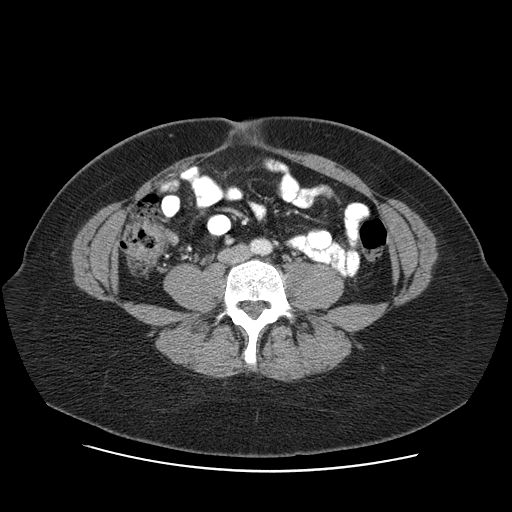
[im 20/72  soft-tissue]
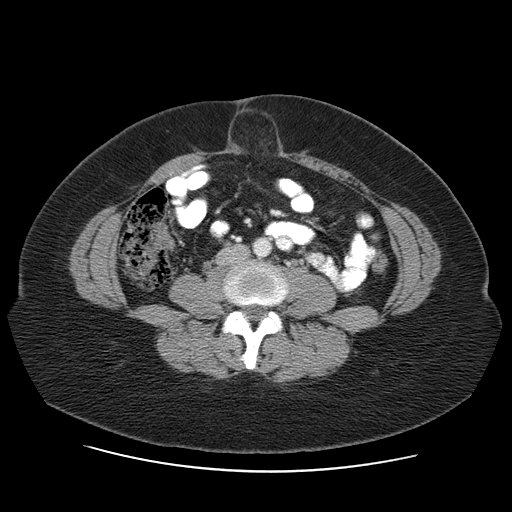
[im 24/72  soft-tissue]
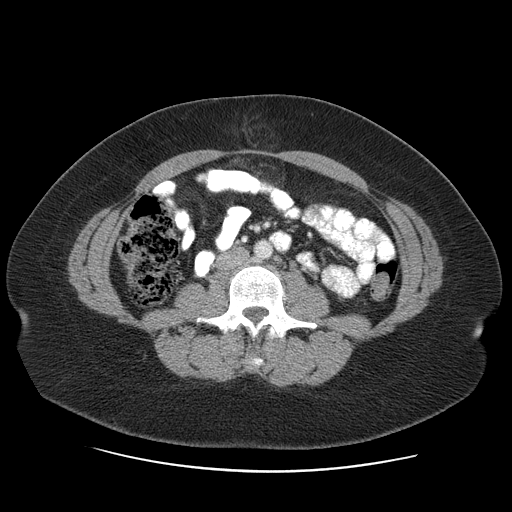
[im 28/72  soft-tissue]
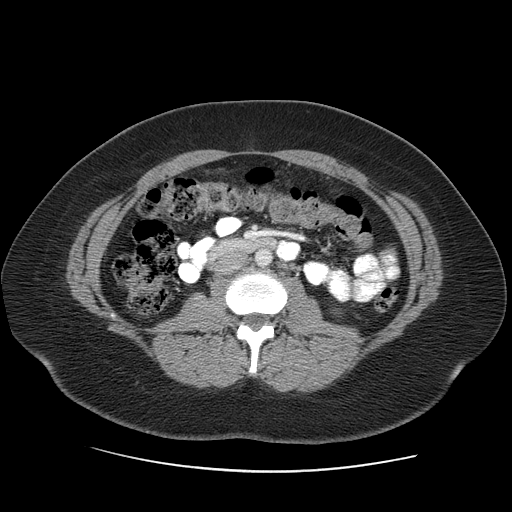
[im 32/72  soft-tissue]
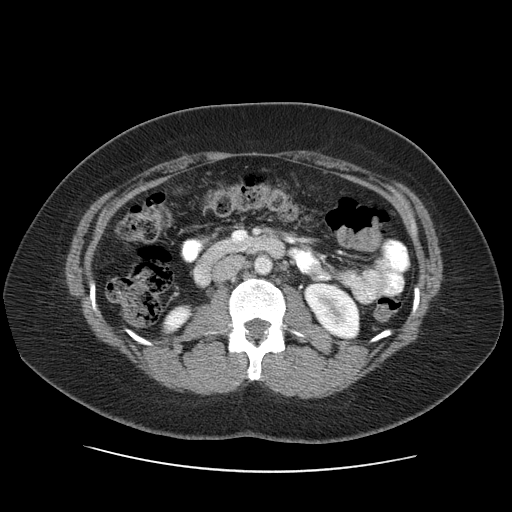
[im 40/72  soft-tissue]
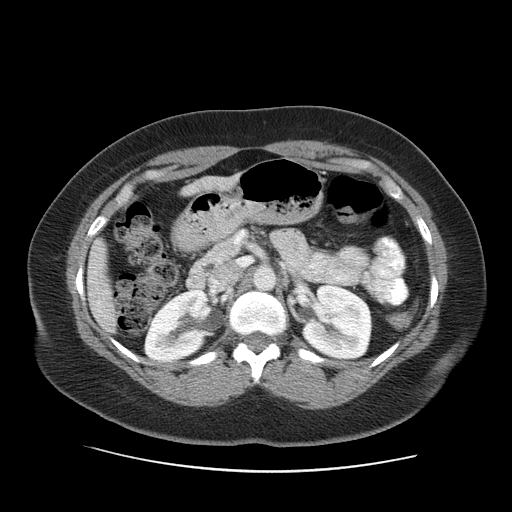
[im 44/72  soft-tissue]
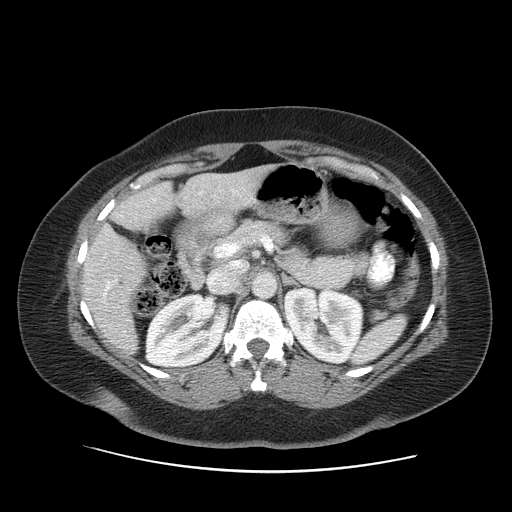
[im 44/72  bone]
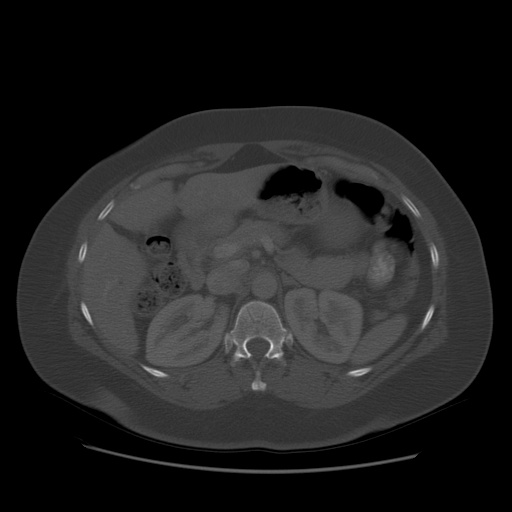
[im 48/72  soft-tissue]
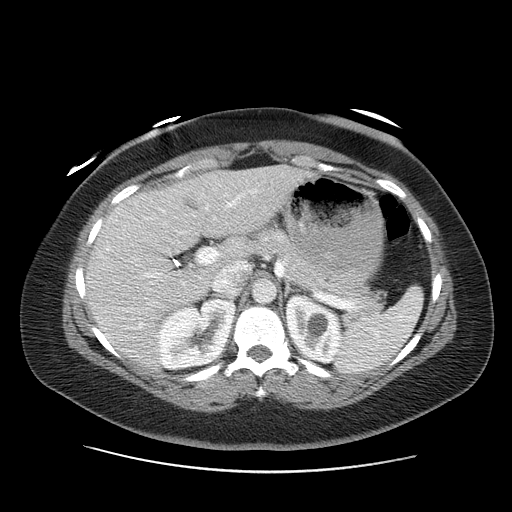
[im 52/72  soft-tissue]
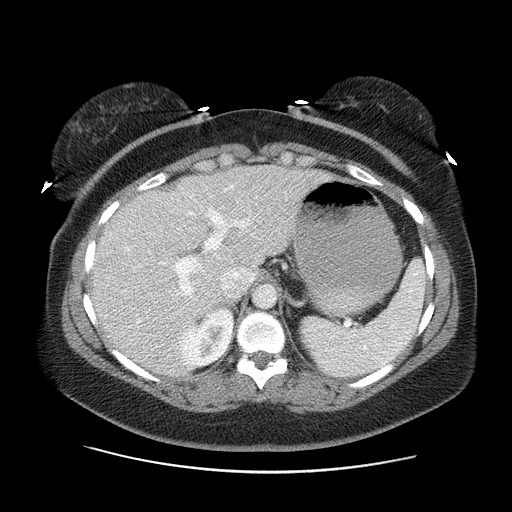
[im 56/72  soft-tissue]
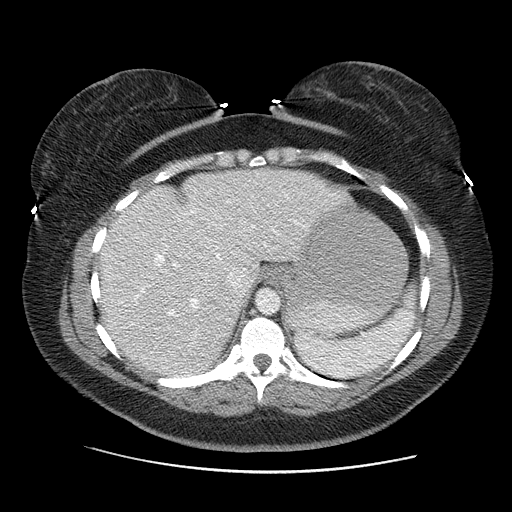
[im 64/72  soft-tissue]
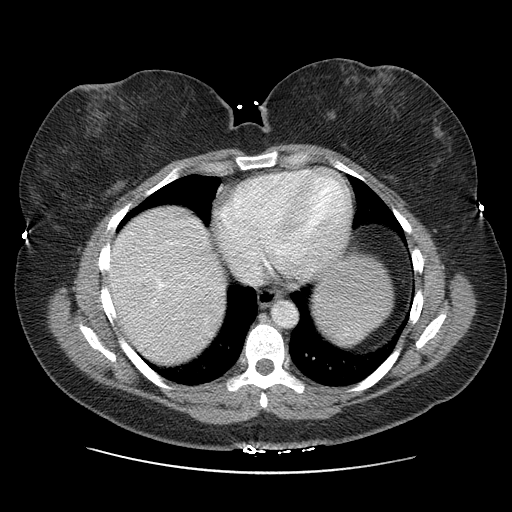
[im 68/72  soft-tissue]
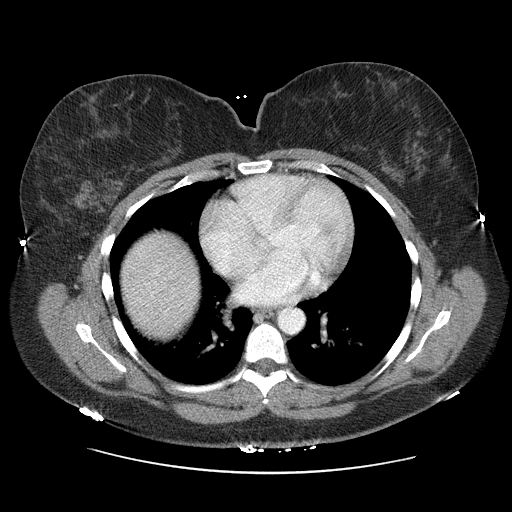

[Series 400: coronal · coronal · 0.76mm/px · 3 of 123 slices shown]
[im 41/123  soft-tissue]
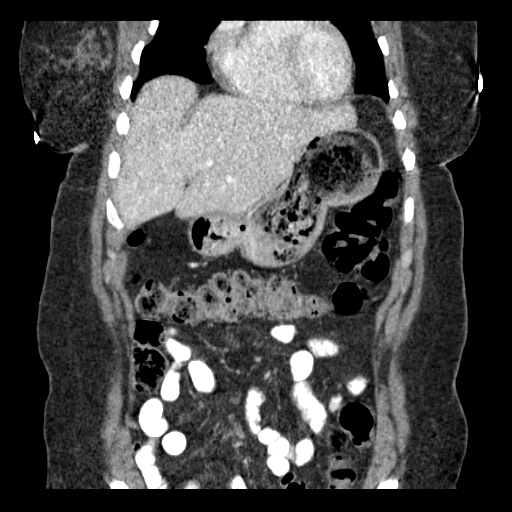
[im 55/123  soft-tissue]
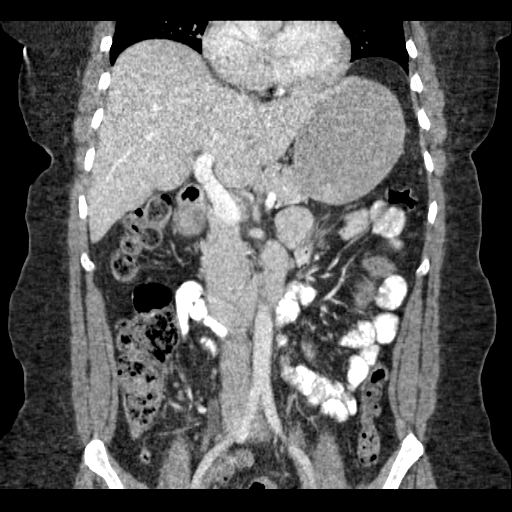
[im 68/123  soft-tissue]
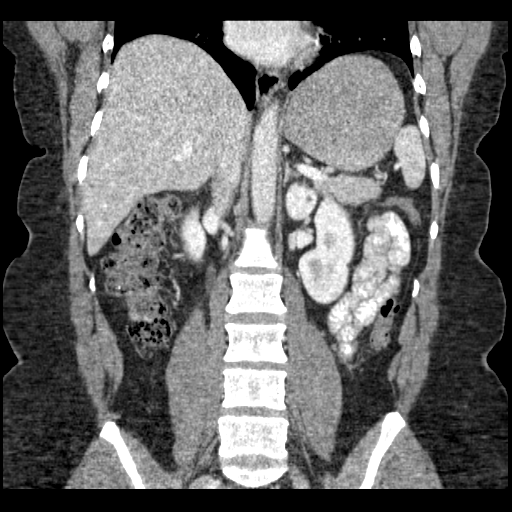

[17 of 46 positions shown; findings below may reference images not displayed]

FINDINGS: The patient has a 4.3 x 4.1 x 3.4 cm periumbilical
hernia containing only fat.  The defect in the anterior abdominal
wall is 1.4 x 4.1 cm.

The liver, spleen, pancreas, adrenal glands, and kidneys
demonstrate no significant abnormality.  Gallbladder has been
removed.  No bile duct dilatation.  Several benign appearing cysts
in the kidneys the largest being on the upper pole on the left, 17
mm in diameter.  No adenopathy.  The appendix and terminal ileum
are normal.  No significant osseous abnormality.
IMPRESSION: Periumbilical hernia containing only fat.  Otherwise benign-
appearing abdomen.

## 2013-04-02 ENCOUNTER — Emergency Department (HOSPITAL_COMMUNITY): Payer: BC Managed Care – PPO

## 2013-04-02 ENCOUNTER — Inpatient Hospital Stay (HOSPITAL_COMMUNITY)
Admission: EM | Admit: 2013-04-02 | Discharge: 2013-04-04 | DRG: 392 | Disposition: A | Discharge status: Home / Self Care | Payer: BC Managed Care – PPO | Attending: Internal Medicine | Admitting: Internal Medicine

## 2013-04-02 ENCOUNTER — Encounter (HOSPITAL_COMMUNITY): Payer: Self-pay | Admitting: Radiology

## 2013-04-02 DIAGNOSIS — R6883 Chills (without fever): Secondary | ICD-10-CM | POA: Diagnosis present

## 2013-04-02 DIAGNOSIS — E7800 Pure hypercholesterolemia, unspecified: Secondary | ICD-10-CM

## 2013-04-02 DIAGNOSIS — R109 Unspecified abdominal pain: Secondary | ICD-10-CM | POA: Diagnosis present

## 2013-04-02 DIAGNOSIS — E669 Obesity, unspecified: Secondary | ICD-10-CM | POA: Diagnosis present

## 2013-04-02 DIAGNOSIS — M549 Dorsalgia, unspecified: Secondary | ICD-10-CM

## 2013-04-02 DIAGNOSIS — K5792 Diverticulitis of intestine, part unspecified, without perforation or abscess without bleeding: Secondary | ICD-10-CM | POA: Diagnosis present

## 2013-04-02 DIAGNOSIS — E785 Hyperlipidemia, unspecified: Secondary | ICD-10-CM | POA: Diagnosis present

## 2013-04-02 DIAGNOSIS — R238 Other skin changes: Secondary | ICD-10-CM

## 2013-04-02 DIAGNOSIS — K5732 Diverticulitis of large intestine without perforation or abscess without bleeding: Secondary | ICD-10-CM | POA: Diagnosis present

## 2013-04-02 DIAGNOSIS — K635 Polyp of colon: Secondary | ICD-10-CM

## 2013-04-02 DIAGNOSIS — K432 Incisional hernia without obstruction or gangrene: Secondary | ICD-10-CM

## 2013-04-02 DIAGNOSIS — J45909 Unspecified asthma, uncomplicated: Secondary | ICD-10-CM | POA: Diagnosis present

## 2013-04-02 DIAGNOSIS — R630 Anorexia: Secondary | ICD-10-CM

## 2013-04-02 DIAGNOSIS — R233 Spontaneous ecchymoses: Secondary | ICD-10-CM

## 2013-04-02 DIAGNOSIS — K449 Diaphragmatic hernia without obstruction or gangrene: Secondary | ICD-10-CM

## 2013-04-02 DIAGNOSIS — R110 Nausea: Secondary | ICD-10-CM

## 2013-04-02 DIAGNOSIS — R197 Diarrhea, unspecified: Secondary | ICD-10-CM | POA: Diagnosis present

## 2013-04-02 DIAGNOSIS — Z973 Presence of spectacles and contact lenses: Secondary | ICD-10-CM

## 2013-04-02 DIAGNOSIS — K219 Gastro-esophageal reflux disease without esophagitis: Secondary | ICD-10-CM | POA: Diagnosis present

## 2013-04-02 DIAGNOSIS — Z09 Encounter for follow-up examination after completed treatment for conditions other than malignant neoplasm: Secondary | ICD-10-CM

## 2013-04-02 DIAGNOSIS — K828 Other specified diseases of gallbladder: Secondary | ICD-10-CM

## 2013-04-02 HISTORY — DX: Gastro-esophageal reflux disease without esophagitis: K21.9

## 2013-04-02 HISTORY — DX: Personal history of other diseases of the digestive system: Z87.19

## 2013-04-02 HISTORY — DX: Unspecified asthma, uncomplicated: J45.909

## 2013-04-02 HISTORY — DX: Other chronic pain: G89.29

## 2013-04-02 HISTORY — DX: Dorsalgia, unspecified: M54.9

## 2013-04-02 LAB — CBC WITH DIFFERENTIAL/PLATELET
BASOS ABS: 0 10*3/uL (ref 0.0–0.1)
BASOS PCT: 0 % (ref 0–1)
Eosinophils Absolute: 0 10*3/uL (ref 0.0–0.7)
Eosinophils Relative: 0 % (ref 0–5)
HCT: 40.1 % (ref 36.0–46.0)
Hemoglobin: 13.7 g/dL (ref 12.0–15.0)
Lymphocytes Relative: 15 % (ref 12–46)
Lymphs Abs: 1.3 10*3/uL (ref 0.7–4.0)
MCH: 27.7 pg (ref 26.0–34.0)
MCHC: 34.2 g/dL (ref 30.0–36.0)
MCV: 81.2 fL (ref 78.0–100.0)
Monocytes Absolute: 0.5 10*3/uL (ref 0.1–1.0)
Monocytes Relative: 6 % (ref 3–12)
NEUTROS ABS: 6.6 10*3/uL (ref 1.7–7.7)
NEUTROS PCT: 79 % — AB (ref 43–77)
Platelets: 195 10*3/uL (ref 150–400)
RBC: 4.94 MIL/uL (ref 3.87–5.11)
RDW: 12.6 % (ref 11.5–15.5)
WBC: 8.5 10*3/uL (ref 4.0–10.5)

## 2013-04-02 LAB — CREATININE, SERUM
Creatinine, Ser: 0.73 mg/dL (ref 0.50–1.10)
GFR calc Af Amer: >90 mL/min
GFR calc non Af Amer: >90 mL/min

## 2013-04-02 LAB — COMPREHENSIVE METABOLIC PANEL
ALBUMIN: 4 g/dL (ref 3.5–5.2)
ALK PHOS: 60 U/L (ref 39–117)
ALT: 10 U/L (ref 0–35)
AST: 15 U/L (ref 0–37)
BILIRUBIN TOTAL: 0.4 mg/dL (ref 0.3–1.2)
BUN: 10 mg/dL (ref 6–23)
CHLORIDE: 102 meq/L (ref 96–112)
CO2: 26 mEq/L (ref 19–32)
Calcium: 9 mg/dL (ref 8.4–10.5)
Creatinine, Ser: 0.67 mg/dL (ref 0.50–1.10)
GFR calc Af Amer: >90 mL/min (ref >90)
GFR calc non Af Amer: >90 mL/min (ref >90)
Glucose, Bld: 105 mg/dL — ABNORMAL HIGH (ref 70–99)
POTASSIUM: 4.4 meq/L (ref 3.7–5.3)
SODIUM: 142 meq/L (ref 137–147)
Total Protein: 7.3 g/dL (ref 6.0–8.3)

## 2013-04-02 LAB — CBC
HEMATOCRIT: 39.5 % (ref 36.0–46.0)
Hemoglobin: 13.6 g/dL (ref 12.0–15.0)
MCH: 28 pg (ref 26.0–34.0)
MCHC: 34.4 g/dL (ref 30.0–36.0)
MCV: 81.4 fL (ref 78.0–100.0)
PLATELETS: 214 10*3/uL (ref 150–400)
RBC: 4.85 MIL/uL (ref 3.87–5.11)
RDW: 12.6 % (ref 11.5–15.5)
WBC: 8.5 10*3/uL (ref 4.0–10.5)

## 2013-04-02 LAB — URINALYSIS, ROUTINE W REFLEX MICROSCOPIC
BILIRUBIN URINE: NEGATIVE
GLUCOSE, UA: NEGATIVE mg/dL
Hgb urine dipstick: NEGATIVE
Ketones, ur: NEGATIVE mg/dL
Leukocytes, UA: NEGATIVE
Nitrite: NEGATIVE
PROTEIN: NEGATIVE mg/dL
Specific Gravity, Urine: 1.022 (ref 1.005–1.030)
UROBILINOGEN UA: 0.2 mg/dL (ref 0.0–1.0)
pH: 6.5 (ref 5.0–8.0)

## 2013-04-02 LAB — POCT PREGNANCY, URINE: Preg Test, Ur: NEGATIVE

## 2013-04-02 MED ORDER — ENOXAPARIN SODIUM 40 MG/0.4ML ~~LOC~~ SOLN
40.0000 mg | SUBCUTANEOUS | Status: DC
  Administered 2013-04-02 – 2013-04-03 (×2): 40 mg via SUBCUTANEOUS
  Filled 2013-04-02 (×3): qty 0.4

## 2013-04-02 MED ORDER — GUAIFENESIN-DM 100-10 MG/5ML PO SYRP: 5.0000 mL | ORAL_SOLUTION | ORAL | Status: DC | PRN

## 2013-04-02 MED ORDER — MORPHINE SULFATE 4 MG/ML IJ SOLN
4.0000 mg | INTRAMUSCULAR | Status: AC | PRN
  Administered 2013-04-02 (×2): 4 mg via INTRAVENOUS
  Filled 2013-04-02 (×2): qty 1

## 2013-04-02 MED ORDER — CIPROFLOXACIN IN D5W 400 MG/200ML IV SOLN
400.0000 mg | Freq: Two times a day (BID) | INTRAVENOUS | Status: DC
  Administered 2013-04-02 – 2013-04-04 (×4): 400 mg via INTRAVENOUS
  Filled 2013-04-02 (×5): qty 200

## 2013-04-02 MED ORDER — SODIUM CHLORIDE 0.9 % IV SOLN
1000.0000 mL | Freq: Once | INTRAVENOUS | Status: AC
  Administered 2013-04-02: 1000 mL via INTRAVENOUS

## 2013-04-02 MED ORDER — MORPHINE SULFATE 4 MG/ML IJ SOLN
4.0000 mg | Freq: Once | INTRAMUSCULAR | Status: AC
  Administered 2013-04-02: 4 mg via INTRAVENOUS
  Filled 2013-04-02: qty 1

## 2013-04-02 MED ORDER — FLUCONAZOLE 100 MG PO TABS
100.0000 mg | ORAL_TABLET | Freq: Every day | ORAL | Status: DC
  Administered 2013-04-02 – 2013-04-04 (×3): 100 mg via ORAL
  Filled 2013-04-02 (×3): qty 1

## 2013-04-02 MED ORDER — SODIUM CHLORIDE 0.9 % IV SOLN
INTRAVENOUS | Status: AC
Start: 2013-04-02 — End: 2013-04-03

## 2013-04-02 MED ORDER — CIPROFLOXACIN IN D5W 400 MG/200ML IV SOLN
400.0000 mg | Freq: Once | INTRAVENOUS | Status: AC
Start: 2013-04-02 — End: 2013-04-02
  Administered 2013-04-02: 400 mg via INTRAVENOUS
  Filled 2013-04-02: qty 200

## 2013-04-02 MED ORDER — HYDROMORPHONE HCL PF 1 MG/ML IJ SOLN
1.0000 mg | INTRAMUSCULAR | Status: AC | PRN
  Administered 2013-04-02: 1 mg via INTRAVENOUS
  Filled 2013-04-02: qty 1

## 2013-04-02 MED ORDER — ONDANSETRON HCL 4 MG PO TABS: 4.0000 mg | ORAL_TABLET | Freq: Four times a day (QID) | ORAL | Status: DC | PRN

## 2013-04-02 MED ORDER — SODIUM CHLORIDE 0.9 % IV SOLN: 1000.0000 mL | INTRAVENOUS | Status: DC

## 2013-04-02 MED ORDER — IOHEXOL 300 MG/ML  SOLN
100.0000 mL | Freq: Once | INTRAMUSCULAR | Status: AC | PRN
  Administered 2013-04-02: 100 mL via INTRAVENOUS

## 2013-04-02 MED ORDER — ONDANSETRON HCL 4 MG/2ML IJ SOLN
4.0000 mg | Freq: Three times a day (TID) | INTRAMUSCULAR | Status: DC | PRN
Start: 2013-04-02 — End: 2013-04-02

## 2013-04-02 MED ORDER — MORPHINE SULFATE 2 MG/ML IJ SOLN
2.0000 mg | INTRAMUSCULAR | Status: DC | PRN
Start: 2013-04-02 — End: 2013-04-04
  Administered 2013-04-02 – 2013-04-03 (×5): 2 mg via INTRAVENOUS
  Filled 2013-04-02 (×5): qty 1

## 2013-04-02 MED ORDER — METRONIDAZOLE IN NACL 5-0.79 MG/ML-% IV SOLN
500.0000 mg | Freq: Three times a day (TID) | INTRAVENOUS | Status: DC
  Administered 2013-04-02 – 2013-04-04 (×6): 500 mg via INTRAVENOUS
  Filled 2013-04-02 (×7): qty 100

## 2013-04-02 MED ORDER — PANTOPRAZOLE SODIUM 40 MG PO TBEC
80.0000 mg | DELAYED_RELEASE_TABLET | Freq: Every day | ORAL | Status: DC
  Administered 2013-04-02 – 2013-04-03 (×2): 80 mg via ORAL
  Filled 2013-04-02 (×2): qty 2

## 2013-04-02 MED ORDER — SODIUM CHLORIDE 0.9 % IV SOLN
INTRAVENOUS | Status: AC
  Administered 2013-04-02 – 2013-04-03 (×2): via INTRAVENOUS

## 2013-04-02 MED ORDER — ONDANSETRON HCL 4 MG/2ML IJ SOLN
4.0000 mg | Freq: Once | INTRAMUSCULAR | Status: AC
  Administered 2013-04-02: 4 mg via INTRAVENOUS
  Filled 2013-04-02: qty 2

## 2013-04-02 MED ORDER — IOHEXOL 300 MG/ML  SOLN
25.0000 mL | Freq: Once | INTRAMUSCULAR | Status: AC | PRN
  Administered 2013-04-02: 25 mL via ORAL

## 2013-04-02 MED ORDER — ONDANSETRON HCL 4 MG/2ML IJ SOLN
4.0000 mg | INTRAMUSCULAR | Status: DC | PRN
  Administered 2013-04-02: 4 mg via INTRAVENOUS
  Filled 2013-04-02: qty 2

## 2013-04-02 MED ORDER — HYDROCODONE-ACETAMINOPHEN 5-325 MG PO TABS
1.0000 | ORAL_TABLET | ORAL | Status: DC | PRN
  Administered 2013-04-04: 2 via ORAL
  Filled 2013-04-02: qty 2

## 2013-04-02 MED ORDER — METRONIDAZOLE IN NACL 5-0.79 MG/ML-% IV SOLN
500.0000 mg | Freq: Once | INTRAVENOUS | Status: AC
  Administered 2013-04-02: 500 mg via INTRAVENOUS
  Filled 2013-04-02: qty 100

## 2013-04-02 MED ORDER — ONDANSETRON HCL 4 MG/2ML IJ SOLN
4.0000 mg | Freq: Four times a day (QID) | INTRAMUSCULAR | Status: DC | PRN
Start: 2013-04-02 — End: 2013-04-04

## 2013-04-02 NOTE — ED Provider Notes (Signed)
Received at change of shift. CT scan with acute diverticulitis; tx with IV cipro/flagyl. Pt requesting more IV pain and nausea meds; will admit. Dx and testing d/w pt and family.  Questions answered.  Verb understanding, agreeable to admit. T/C to [facility] Dr. Candiss Norse, case discussed, including:  HPI, pertinent PM/SHx, VS/PE, dx testing, ED course and treatment:  Agreeable to observation admit, requests to write temporary orders, obtain medical bed to team 10.   Alfonzo Feller, DO 04/02/13 [phone]

## 2013-04-02 NOTE — ED Notes (Signed)
Report called to Brandy, RN.

## 2013-04-02 NOTE — ED Provider Notes (Signed)
CSN: [id number]     Arrival date & time 04/02/13  0141 History   First MD Initiated Contact with Patient 04/02/13 0544     Chief complaint: Abdominal pain, diarrhea  (Consider location/radiation/quality/duration/timing/severity/associated sxs/prior Treatment) The history is provided by the patient.   43 year old female today history of diverticulitis states that she started having diarrhea and crampy periumbilical pain yesterday afternoon. Pain does not radiate. Symptoms got progressively worse and there's been associated nausea without vomiting. She's had chills without fever or sweats. Cramping is severe and she rates it 8/10. There is momentary relief following diarrhea. There's been no blood or mucus in the stool. She is concerned that this may be a flareup of her diverticulitis. She denies any sick contacts. She has not done anything to treat it at home.  Past Medical History  Diagnosis Date  . Abdominal pain   . Nausea   . Hernia   . Back pain   . Hyperlipidemia   . Asthma   . Angina     NO HEART PROBLEM WENT TO ED 05/22/11 FOR CP STRESS TEST WAS NEGATIVE   . Nausea   . Neuromuscular disorder     LUMBA BACK  TROUBLE   . Rash     UNDER BIL BREASTS   Past Surgical History  Procedure Laterality Date  . Tubal ligation  01/1996  . Colonoscopy  07/2010    Removal of 3 polyps  . Abscess removal  1983    left breast  . Cholecystectomy    . Wisdom tooth extraction    . Ventral hernia repair  06/12/2011    Procedure: LAPAROSCOPIC VENTRAL HERNIA;  Surgeon: Gwenyth Ober, MD;  Location: [facility];  Service: General;  Laterality: N/A;  . Fracture surgery  06/12/11   Family History  Problem Relation Age of Onset  . Diabetes Mother   . Heart disease Father   . Cancer Father     kidney and prostate  . Hyperlipidemia Father   . Other Father     gout and clogged arteries  . Hyperlipidemia Sister   . Diabetes Sister   . Diabetes Brother   . Hyperlipidemia Brother    History   Substance Use Topics  . Smoking status: Never Smoker   . Smokeless tobacco: Not on file  . Alcohol Use: No   OB History   Grav Para Term Preterm Abortions TAB SAB Ect Mult Living                 Review of Systems  All other systems reviewed and are negative.      Allergies  Amoxicillin; Sulfa antibiotics; Penicillins; and Tape  Home Medications   Current Outpatient Rx  Name  Route  Sig  Dispense  Refill  . cetirizine (ZYRTEC) 10 MG tablet   Oral   Take 10 mg by mouth daily.           . diphenhydrAMINE (BENADRYL) 25 mg capsule   Oral   Take 25 mg by mouth every 6 (six) hours as needed. For allergy symptoms         . esomeprazole (NEXIUM) 40 MG capsule   Oral   Take 40 mg by mouth daily before breakfast.         . HYDROcodone-acetaminophen (NORCO/VICODIN) 5-325 MG per tablet   Oral   Take 1 tablet by mouth every 6 (six) hours as needed for moderate pain.         . ranitidine (ZANTAC) 150  MG tablet   Oral   Take 150 mg by mouth 3 (three) times daily before meals.         . Vitamin D, Ergocalciferol, (DRISDOL) 50000 UNITS CAPS   Oral   Take 50,000 Units by mouth 2 (two) times a week. Tuesday & friday         . albuterol (PROVENTIL HFA;VENTOLIN HFA) 108 (90 BASE) MCG/ACT inhaler   Inhalation   Inhale 2 puffs into the lungs every 6 (six) hours as needed. For shortness of breath          BP 128/78  Pulse 94  SpO2 94% Physical Exam  Nursing note and vitals reviewed.  43 year old female, resting comfortably and in no acute distress. Vital signs are normal. Oxygen saturation is 94%, which is normal. Head is normocephalic and atraumatic. PERRLA, EOMI. Oropharynx is clear. Neck is nontender and supple without adenopathy or JVD. Back is nontender and there is no CVA tenderness. Lungs are clear without rales, wheezes, or rhonchi. Chest is nontender. Heart has regular rate and rhythm without murmur. Abdomen is soft, flat, with mild tenderness  diffusely but moderate tenderness in the right lower quadrant suprapubic area. There is no rebound or guarding. There are no masses or hepatosplenomegaly and peristalsis is slightly hypoactive. Extremities have no cyanosis or edema, full range of motion is present. Skin is warm and dry without rash. Neurologic: Mental status is normal, cranial nerves are intact, there are no motor or sensory deficits.  ED Course  Procedures (including critical care time) Labs Review Results for orders placed during the hospital encounter of 04/02/13  CBC WITH DIFFERENTIAL      Result Value Ref Range   WBC 8.5  4.0 - 10.5 K/uL   RBC 4.94  3.87 - 5.11 MIL/uL   Hemoglobin 13.7  12.0 - 15.0 g/dL   HCT 40.1  36.0 - 46.0 %   MCV 81.2  78.0 - 100.0 fL   MCH 27.7  26.0 - 34.0 pg   MCHC 34.2  30.0 - 36.0 g/dL   RDW 12.6  11.5 - 15.5 %   Platelets 195  150 - 400 K/uL   Neutrophils Relative % 79 (*) 43 - 77 %   Neutro Abs 6.6  1.7 - 7.7 K/uL   Lymphocytes Relative 15  12 - 46 %   Lymphs Abs 1.3  0.7 - 4.0 K/uL   Monocytes Relative 6  3 - 12 %   Monocytes Absolute 0.5  0.1 - 1.0 K/uL   Eosinophils Relative 0  0 - 5 %   Eosinophils Absolute 0.0  0.0 - 0.7 K/uL   Basophils Relative 0  0 - 1 %   Basophils Absolute 0.0  0.0 - 0.1 K/uL  COMPREHENSIVE METABOLIC PANEL      Result Value Ref Range   Sodium 142  137 - 147 mEq/L   Potassium 4.4  3.7 - 5.3 mEq/L   Chloride 102  96 - 112 mEq/L   CO2 26  19 - 32 mEq/L   Glucose, Bld 105 (*) 70 - 99 mg/dL   BUN 10  6 - 23 mg/dL   Creatinine, Ser 0.67  0.50 - 1.10 mg/dL   Calcium 9.0  8.4 - 10.5 mg/dL   Total Protein 7.3  6.0 - 8.3 g/dL   Albumin 4.0  3.5 - 5.2 g/dL   AST 15  0 - 37 U/L   ALT 10  0 - 35 U/L   Alkaline  Phosphatase 60  39 - 117 U/L   Total Bilirubin 0.4  0.3 - 1.2 mg/dL   GFR calc non Af Amer >90  >90 mL/min   GFR calc Af Amer >90  >90 mL/min  URINALYSIS, ROUTINE W REFLEX MICROSCOPIC      Result Value Ref Range   Color, Urine YELLOW  YELLOW    APPearance HAZY (*) CLEAR   Specific Gravity, Urine 1.022  1.005 - 1.030   pH 6.5  5.0 - 8.0   Glucose, UA NEGATIVE  NEGATIVE mg/dL   Hgb urine dipstick NEGATIVE  NEGATIVE   Bilirubin Urine NEGATIVE  NEGATIVE   Ketones, ur NEGATIVE  NEGATIVE mg/dL   Protein, ur NEGATIVE  NEGATIVE mg/dL   Urobilinogen, UA 0.2  0.0 - 1.0 mg/dL   Nitrite NEGATIVE  NEGATIVE   Leukocytes, UA NEGATIVE  NEGATIVE  POCT PREGNANCY, URINE      Result Value Ref Range   Preg Test, Ur NEGATIVE  NEGATIVE   MDM   Final diagnoses:  Diarrhea  Nausea  Abdominal pain    Nausea, diarrhea, crampy abdominal pain which could be a viral gastroenteritis but could be early diverticulitis. Review of old records shows no ED visits for diverticulitis and CT scan done 1 year ago showed umbilical hernia but no diverticulitis. I do not see any other CT scans in her record. She states that they diverticulitis was diagnosed by her gastroenterologist. Since it will impact treatment today, CT will be obtained to rule out diverticulitis. The meantime, she's given IV fluids, IV ondansetron, and IV morphine.   Laboratory workup is unremarkable. CT scan is pending. Case is signed out to Dr. Thurnell Garbe to evaluate results of CT scan.  Delora Fuel, MD 09/47/09 [id number]

## 2013-04-02 NOTE — ED Notes (Signed)
Attempted report but was unsuccessful. Awaiting return phone call.

## 2013-04-02 NOTE — H&P (Signed)
Patient Demographics  [name], is a 43 y.o. female  MRN: [id number]   DOB - [birth year]  Admit Date - 04/02/2013  Outpatient Primary MD for the patient is [name], MD   With History of -  Past Medical History  Diagnosis Date  . Abdominal pain   . Nausea   . Hernia   . Back pain   . Hyperlipidemia   . Asthma   . Angina     NO HEART PROBLEM WENT TO ED 05/22/11 FOR CP STRESS TEST WAS NEGATIVE   . Nausea   . Neuromuscular disorder     LUMBA BACK  TROUBLE   . Rash     UNDER BIL BREASTS      Past Surgical History  Procedure Laterality Date  . Tubal ligation  01/1996  . Colonoscopy  07/2010    Removal of 3 polyps  . Abscess removal  1983    left breast  . Cholecystectomy    . Wisdom tooth extraction    . Ventral hernia repair  06/12/2011    Procedure: LAPAROSCOPIC VENTRAL HERNIA;  Surgeon: Gwenyth Ober, MD;  Location: [place];  Service: General;  Laterality: N/A;  . Fracture surgery  06/12/11    in for   Chief Complaint  Patient presents with  . Abdominal Pain      HPI  [name]  is a 43 y.o. female, with history of diverticulosis, asthma, GERD comes to the ER with a one-day history of subjective chills, left lower quadrant abdominal pain along with loose stools, she denies any blood or mucus in stool, no exposure to bad food, no other family members are sick. In the ER her blood work was unremarkable however a CT scan of the abdomen and pelvis was consistent with acute diverticulitis and I was called to admit the patient.  Currently besides left lower quadrant abdominal pain she has no subjective complaints, no fevers subjective chills, no cough chest pain phlegm or shortness of breath, abdominal symptoms as above, no focal weakness.   Review of Systems    In addition to the  HPI above,  No Fever-chills, No Headache, No changes with Vision or hearing, No problems swallowing food or Liquids, No Chest pain, Cough or Shortness of Breath, Positive Abdominal pain, No Nausea or Vommitting, Bowel movements are loose, No Blood in stool or Urine, No dysuria, No new skin rashes or bruises, No new joints pains-aches,  No new weakness, tingling, numbness in any extremity, No recent weight gain or loss, No polyuria, polydypsia or polyphagia, No significant Mental Stressors.  A full 10 point Review of Systems was done, except as stated above, all other Review of Systems were negative.   Social History History  Substance Use Topics  . Smoking status: Never Smoker   . Smokeless tobacco: Not on file  . Alcohol Use: No      Family History Family History  Problem Relation Age of  Onset  . Diabetes Mother   . Heart disease Father   . Cancer Father     kidney and prostate  . Hyperlipidemia Father   . Other Father     gout and clogged arteries  . Hyperlipidemia Sister   . Diabetes Sister   . Diabetes Brother   . Hyperlipidemia Brother       Prior to Admission medications   Medication Sig Start Date End Date Taking? Authorizing Provider  cetirizine (ZYRTEC) 10 MG tablet Take 10 mg by mouth daily.     Yes Historical Provider, MD  diphenhydrAMINE (BENADRYL) 25 mg capsule Take 25 mg by mouth every 6 (six) hours as needed. For allergy symptoms   Yes Historical Provider, MD  esomeprazole (NEXIUM) 40 MG capsule Take 40 mg by mouth daily before breakfast.   Yes Historical Provider, MD  HYDROcodone-acetaminophen (NORCO/VICODIN) 5-325 MG per tablet Take 1 tablet by mouth every 6 (six) hours as needed for moderate pain.   Yes Historical Provider, MD  ranitidine (ZANTAC) 150 MG tablet Take 150 mg by mouth 3 (three) times daily before meals.   Yes Historical Provider, MD  Vitamin D, Ergocalciferol, (DRISDOL) 50000 UNITS CAPS Take 50,000 Units by mouth 2 (two) times a week.  Tuesday & friday   Yes Historical Provider, MD  albuterol (PROVENTIL HFA;VENTOLIN HFA) 108 (90 BASE) MCG/ACT inhaler Inhale 2 puffs into the lungs every 6 (six) hours as needed. For shortness of breath    Historical Provider, MD    Allergies  Allergen Reactions  . Amoxicillin Hives  . Sulfa Antibiotics Hives  . Penicillins Hives and Itching  . Tape     PLASTIC TAPE   (WHELPS, TORE SKIN)    Physical Exam  Vitals  Blood pressure 122/79, pulse 97, temperature 98.1 F (36.7 C), temperature source Oral, resp. rate 18, SpO2 99.00%.   1. General obese young African American female lying in bed in NAD,     2. Normal affect and insight, Not Suicidal or Homicidal, Awake Alert, Oriented X 3.  3. No F.N deficits, ALL C.Nerves Intact, Strength 5/5 all 4 extremities, Sensation intact all 4 extremities, Plantars down going.  4. Ears and Eyes appear Normal, Conjunctivae clear, PERRLA. Moist Oral Mucosa.  5. Supple Neck, No JVD, No cervical lymphadenopathy appriciated, No Carotid Bruits.  6. Symmetrical Chest wall movement, Good air movement bilaterally, CTAB.  7. RRR, No Gallops, Rubs or Murmurs, No Parasternal Heave.  8. Positive Bowel Sounds, Abdomen Soft, mild left lower quadrant tenderness, No organomegaly appriciated,No rebound -guarding or rigidity.  9.  No Cyanosis, Normal Skin Turgor, No Skin Rash or Bruise.  10. Good muscle tone,  joints appear normal , no effusions, Normal ROM.  11. No Palpable Lymph Nodes in Neck or Axillae     Data Review  CBC  Recent Labs Lab 04/02/13 0422  WBC 8.5  HGB 13.7  HCT 40.1  PLT 195  MCV 81.2  MCH 27.7  MCHC 34.2  RDW 12.6  LYMPHSABS 1.3  MONOABS 0.5  EOSABS 0.0  BASOSABS 0.0   ------------------------------------------------------------------------------------------------------------------  Chemistries   Recent Labs Lab 04/02/13 0422  NA 142  K 4.4  CL 102  CO2 26  GLUCOSE 105*  BUN 10  CREATININE 0.67  CALCIUM  9.0  AST 15  ALT 10  ALKPHOS 60  BILITOT 0.4   ------------------------------------------------------------------------------------------------------------------ CrCl is unknown because both a height and weight (above a minimum accepted value) are required for this calculation. ------------------------------------------------------------------------------------------------------------------ No results found  for this basename: TSH, T4TOTAL, FREET3, T3FREE, THYROIDAB,  in the last 72 hours   Coagulation profile No results found for this basename: INR, PROTIME,  in the last 168 hours ------------------------------------------------------------------------------------------------------------------- No results found for this basename: DDIMER,  in the last 72 hours -------------------------------------------------------------------------------------------------------------------  Cardiac Enzymes No results found for this basename: CK, CKMB, TROPONINI, MYOGLOBIN,  in the last 168 hours ------------------------------------------------------------------------------------------------------------------ No components found with this basename: POCBNP,    ---------------------------------------------------------------------------------------------------------------  Urinalysis    Component Value Date/Time   COLORURINE YELLOW 04/02/2013 0330   APPEARANCEUR HAZY* 04/02/2013 0330   LABSPEC 1.022 04/02/2013 0330   PHURINE 6.5 04/02/2013 0330   GLUCOSEU NEGATIVE 04/02/2013 0330   HGBUR NEGATIVE 04/02/2013 0330   BILIRUBINUR NEGATIVE 04/02/2013 0330   BILIRUBINUR negative 03/24/2011 0835   KETONESUR NEGATIVE 04/02/2013 0330   PROTEINUR NEGATIVE 04/02/2013 0330   UROBILINOGEN 0.2 04/02/2013 0330   UROBILINOGEN 0.2 03/24/2011 0835   NITRITE NEGATIVE 04/02/2013 0330   NITRITE negative 03/24/2011 0835   LEUKOCYTESUR NEGATIVE 04/02/2013 0330     ----------------------------------------------------------------------------------------------------------------  Imaging results:   Ct Abdomen Pelvis W Contrast  04/02/2013   CLINICAL DATA:  Abdominal pain.  EXAM: CT ABDOMEN AND PELVIS WITH CONTRAST  TECHNIQUE: Multidetector CT imaging of the abdomen and pelvis was performed using the standard protocol following bolus administration of intravenous contrast.  CONTRAST:  OMNIPAQUE IOHEXOL 300 MG/ML  SOLN  COMPARISON:  CT ABD W/CM dated 03/31/2011  FINDINGS: Lung bases show dependent atelectasis bilaterally. Heart size is at the upper limits of normal. No pericardial or pleural effusion.  Liver is unremarkable. Cholecystectomy. Adrenal glands are unremarkable. Low-attenuation lesions in the kidneys measure up to 1.9 cm in the upper pole right kidney, similar to the prior exam. Statistically, cysts are likely. Spleen, pancreas, stomach and small bowel are unremarkable. Wall thickening and pericolonic inflammatory stranding are seen in the sigmoid colon. Small amount of adjacent fluid, including interloop fluid (image 88). No organized fluid collection or extraluminal air. Colon is otherwise unremarkable.  Contour abnormalities along the uterine fundus are indicative of fibroids, measuring up to approximately 1.9 cm (image 88). Ovaries are visualized. No dependent pelvic free fluid. No pathologically enlarged lymph nodes. Retroperitoneal lymph nodes are subcentimeter in short axis size. Periumbilical hernias contain fat and omentum. Small right inguinal hernia contains fat. No worrisome lytic or sclerotic lesions.  IMPRESSION: 1. Sigmoid diverticulitis with small amount of pericolonic stranding and fluid. No abscess or extraluminal air. 2. Periumbilical and small right inguinal hernias. 3. Probable uterine fibroids.   Electronically Signed   By: Leanna Battles M.D.   On: 04/02/2013 08:22         Assessment & Plan     1. Abdominal pain due to  acute diverticulitis in a patient with history of diverticulosis. Plan will be to admit the patient on MedSurg bed, bowel rest, IV fluids, will check stool PCR to rule out any infectious etiology, IV Cipro Flagyl and IV fluids. Monitor with supportive care.     2. Asthma. No acute issues nebulizer treatments and as stated bases. No wheezing on exam.    3. GERD. Place on PPI      DVT Prophylaxis  Lovenox    AM Labs Ordered, also please review Full Orders  Family Communication: Admission, patients condition and plan of care including tests being ordered have been discussed with the patient and  husband who indicate understanding and agree with the plan and Code Status.  Code Status Full  Likely  DC to Home   Condition GUARDED    Time spent in minutes : 35    SINGH,PRASHANT K M.D on 04/02/2013 at 12:21 PM  Between 7am to 7pm - Pager - [phone]  After 7pm go to [email or web address] - password [email or web address]  And look for the night coverage person covering me after hours  [facility] Office  [phone]

## 2013-04-02 NOTE — ED Notes (Signed)
See paper Charting.

## 2013-04-03 LAB — BASIC METABOLIC PANEL
BUN: 6 mg/dL (ref 6–23)
CHLORIDE: 103 meq/L (ref 96–112)
CO2: 23 mEq/L (ref 19–32)
CREATININE: 0.74 mg/dL (ref 0.50–1.10)
Calcium: 8.4 mg/dL (ref 8.4–10.5)
GFR calc Af Amer: >90 mL/min (ref >90)
GFR calc non Af Amer: >90 mL/min (ref >90)
Glucose, Bld: 86 mg/dL (ref 70–99)
Potassium: 3.6 mEq/L — ABNORMAL LOW (ref 3.7–5.3)
SODIUM: 140 meq/L (ref 137–147)

## 2013-04-03 LAB — CBC
HCT: 35.2 % — ABNORMAL LOW (ref 36.0–46.0)
Hemoglobin: 11.7 g/dL — ABNORMAL LOW (ref 12.0–15.0)
MCH: 27.3 pg (ref 26.0–34.0)
MCHC: 33.2 g/dL (ref 30.0–36.0)
MCV: 82.2 fL (ref 78.0–100.0)
Platelets: 175 10*3/uL (ref 150–400)
RBC: 4.28 MIL/uL (ref 3.87–5.11)
RDW: 12.8 % (ref 11.5–15.5)
WBC: 7.2 10*3/uL (ref 4.0–10.5)

## 2013-04-03 MED ORDER — POTASSIUM CHLORIDE CRYS ER 20 MEQ PO TBCR
40.0000 meq | EXTENDED_RELEASE_TABLET | Freq: Once | ORAL | Status: AC
  Administered 2013-04-03: 40 meq via ORAL
  Filled 2013-04-03: qty 2

## 2013-04-03 MED ORDER — POLYETHYLENE GLYCOL 3350 17 G PO PACK
17.0000 g | PACK | Freq: Every day | ORAL | Status: DC
  Administered 2013-04-03 – 2013-04-04 (×2): 17 g via ORAL
  Filled 2013-04-03 (×3): qty 1

## 2013-04-03 MED ORDER — ZOLPIDEM TARTRATE 5 MG PO TABS: 5.0000 mg | ORAL_TABLET | Freq: Every evening | ORAL | Status: DC | PRN

## 2013-04-03 MED ORDER — WHITE PETROLATUM GEL
Status: AC
  Administered 2013-04-03: 0.2
  Filled 2013-04-03: qty 5

## 2013-04-03 MED ORDER — DOCUSATE SODIUM 100 MG PO CAPS
100.0000 mg | ORAL_CAPSULE | Freq: Two times a day (BID) | ORAL | Status: DC
  Administered 2013-04-03 – 2013-04-04 (×2): 100 mg via ORAL
  Filled 2013-04-03 (×3): qty 1

## 2013-04-03 NOTE — Progress Notes (Signed)
Patient Demographics  [name], is a 43 y.o. female, DOB - Mar 09, 1970, [id number]  Admit date - 04/02/2013   Admitting Physician Thurnell Lose, MD  Outpatient Primary MD for the patient is [name], MD  LOS - 1   Chief Complaint  Patient presents with  . Abdominal Pain        Assessment & Plan    1. Abdominal pain due to acute diverticulitis in a patient with history of diverticulosis. Much improved after bowel rest, IV fluids, continue IV Cipro Flagyl, advance diet, await GI pathogen PCR , likely discharge in the a.m..      2. Asthma. No acute issues nebulizer treatments and as stated bases. No wheezing on exam.     3. GERD. Place on PPI     4.Low K - replaced      Code Status: Full  Family Communication:    Disposition Plan: Home   Procedures CT Abd-Pelvis   Consults      Medications  Scheduled Meds: . sodium chloride   Intravenous STAT  . ciprofloxacin  400 mg Intravenous Q12H  . docusate sodium  100 mg Oral BID  . enoxaparin (LOVENOX) injection  40 mg Subcutaneous Q24H  . fluconazole  100 mg Oral Daily  . metronidazole  500 mg Intravenous Q8H  . pantoprazole  80 mg Oral Q1200  . polyethylene glycol  17 g Oral Daily   Continuous Infusions:  PRN Meds:.guaiFENesin-dextromethorphan, HYDROcodone-acetaminophen, morphine injection, ondansetron, ondansetron (ZOFRAN) IV, ondansetron, zolpidem  DVT Prophylaxis  Lovenox    Lab Results  Component Value Date   PLT 175 04/03/2013    Antibiotics     Anti-infectives   Start     Dose/Rate Route Frequency Ordered Stop   04/02/13 1630  fluconazole (DIFLUCAN) tablet 100 mg     100 mg Oral Daily 04/02/13 1603     04/02/13 0900  ciprofloxacin (CIPRO) IVPB 400 mg     400 mg 200 mL/hr over 60  Minutes Intravenous Every 12 hours 04/02/13 0858     04/02/13 0900  metroNIDAZOLE (FLAGYL) IVPB 500 mg     500 mg 100 mL/hr over 60 Minutes Intravenous Every 8 hours 04/02/13 0858     04/02/13 0845  ciprofloxacin (CIPRO) IVPB 400 mg     400 mg 200 mL/hr over 60 Minutes Intravenous  Once 04/02/13 0843 04/02/13 1122   04/02/13 0845  metroNIDAZOLE (FLAGYL) IVPB 500 mg     500 mg 100 mL/hr over 60 Minutes Intravenous  Once 04/02/13 0843 04/02/13 1121          Subjective:   [name] today has, No headache, No chest pain, much improved abdominal pain - No Nausea, No new weakness tingling or numbness, No Cough - SOB.    Objective:   Filed Vitals:   04/02/13 1249 04/02/13 2112 04/03/13 0504 04/03/13 1000  BP: 121/79 107/77 105/69 126/88  Pulse: 101 115 105 107  Temp: 97.6 F (36.4 C) 98.4 F (36.9 C) 98.8 F (37.1 C) 97.6 F (36.4 C)  TempSrc: Oral Oral Oral Oral  Resp: 18 20 25 18   Height: 5\' 9"  (1.753 m)     Weight: 109.272 kg (240 lb 14.4 oz)  108.818 kg (239 lb 14.4  oz)   SpO2: 99% 95% 95% 98%    Wt Readings from Last 3 Encounters:  04/03/13 108.818 kg (239 lb 14.4 oz)  04/23/12 105.688 kg (233 lb)  03/26/12 103.602 kg (228 lb 6.4 oz)     Intake/Output Summary (Last 24 hours) at 04/03/13 1059 Last data filed at 04/03/13 1000  Gross per 24 hour  Intake    670 ml  Output   1750 ml  Net  -1080 ml     Physical Exam  Awake Alert, Oriented X 3, No new F.N deficits, Normal affect [place].AT,PERRAL Supple Neck,No JVD, No cervical lymphadenopathy appriciated.  Symmetrical Chest wall movement, Good air movement bilaterally, CTAB RRR,No Gallops,Rubs or new Murmurs, No Parasternal Heave +ve B.Sounds, Abd Soft, mild LLQ tenderness, No organomegaly appriciated, No rebound - guarding or rigidity. No Cyanosis, Clubbing or edema, No new Rash or bruise      Data Review   Micro Results No results found for this or any previous visit (from the past 240  hour(s)).  Radiology Reports Ct Abdomen Pelvis W Contrast  04/02/2013   CLINICAL DATA:  Abdominal pain.  EXAM: CT ABDOMEN AND PELVIS WITH CONTRAST  TECHNIQUE: Multidetector CT imaging of the abdomen and pelvis was performed using the standard protocol following bolus administration of intravenous contrast.  CONTRAST:  152mL OMNIPAQUE IOHEXOL 300 MG/ML  SOLN  COMPARISON:  CT ABD W/CM dated 03/31/2011  FINDINGS: Lung bases show dependent atelectasis bilaterally. Heart size is at the upper limits of normal. No pericardial or pleural effusion.  Liver is unremarkable. Cholecystectomy. Adrenal glands are unremarkable. Low-attenuation lesions in the kidneys measure up to 1.9 cm in the upper pole right kidney, similar to the prior exam. Statistically, cysts are likely. Spleen, pancreas, stomach and small bowel are unremarkable. Wall thickening and pericolonic inflammatory stranding are seen in the sigmoid colon. Small amount of adjacent fluid, including interloop fluid (image 88). No organized fluid collection or extraluminal air. Colon is otherwise unremarkable.  Contour abnormalities along the uterine fundus are indicative of fibroids, measuring up to approximately 1.9 cm (image 88). Ovaries are visualized. No dependent pelvic free fluid. No pathologically enlarged lymph nodes. Retroperitoneal lymph nodes are subcentimeter in short axis size. Periumbilical hernias contain fat and omentum. Small right inguinal hernia contains fat. No worrisome lytic or sclerotic lesions.  IMPRESSION: 1. Sigmoid diverticulitis with small amount of pericolonic stranding and fluid. No abscess or extraluminal air. 2. Periumbilical and small right inguinal hernias. 3. Probable uterine fibroids.   Electronically Signed   By: Lorin Picket M.D.   On: 04/02/2013 08:22    CBC  Recent Labs Lab 04/02/13 0422 04/02/13 1350 04/03/13 0323  WBC 8.5 8.5 7.2  HGB 13.7 13.6 11.7*  HCT 40.1 39.5 35.2*  PLT 195 214 175  MCV 81.2 81.4 82.2   MCH 27.7 28.0 27.3  MCHC 34.2 34.4 33.2  RDW 12.6 12.6 12.8  LYMPHSABS 1.3  --   --   MONOABS 0.5  --   --   EOSABS 0.0  --   --   BASOSABS 0.0  --   --     Chemistries   Recent Labs Lab 04/02/13 0422 04/02/13 1350 04/03/13 0323  NA 142  --  140  K 4.4  --  3.6*  CL 102  --  103  CO2 26  --  23  GLUCOSE 105*  --  86  BUN 10  --  6  CREATININE 0.67 0.73 0.74  CALCIUM 9.0  --  8.4  AST 15  --   --   ALT 10  --   --   ALKPHOS 60  --   --   BILITOT 0.4  --   --    ------------------------------------------------------------------------------------------------------------------ estimated creatinine clearance is 117.9 ml/min (by C-G formula based on Cr of 0.74). ------------------------------------------------------------------------------------------------------------------ No results found for this basename: HGBA1C,  in the last 72 hours ------------------------------------------------------------------------------------------------------------------ No results found for this basename: CHOL, HDL, LDLCALC, TRIG, CHOLHDL, LDLDIRECT,  in the last 72 hours ------------------------------------------------------------------------------------------------------------------ No results found for this basename: TSH, T4TOTAL, FREET3, T3FREE, THYROIDAB,  in the last 72 hours ------------------------------------------------------------------------------------------------------------------ No results found for this basename: VITAMINB12, FOLATE, FERRITIN, TIBC, IRON, RETICCTPCT,  in the last 72 hours  Coagulation profile No results found for this basename: INR, PROTIME,  in the last 168 hours  No results found for this basename: DDIMER,  in the last 72 hours  Cardiac Enzymes No results found for this basename: CK, CKMB, TROPONINI, MYOGLOBIN,  in the last 168 hours ------------------------------------------------------------------------------------------------------------------ No  components found with this basename: POCBNP,      Time Spent in minutes   35   Lala Lund K M.D on 04/03/2013 at 10:59 AM  Between 7am to 7pm - Pager - [phone]  After 7pm go to [email or web address] - password [email or web address]  And look for the night coverage person covering for me after hours  [facility] Office  [phone]

## 2013-04-04 LAB — POTASSIUM: POTASSIUM: 3.9 meq/L (ref 3.7–5.3)

## 2013-04-04 MED ORDER — FLUCONAZOLE 100 MG PO TABS: 100.0000 mg | ORAL_TABLET | Freq: Every day | ORAL | Status: DC

## 2013-04-04 MED ORDER — CIPROFLOXACIN HCL 500 MG PO TABS
500.0000 mg | ORAL_TABLET | Freq: Two times a day (BID) | ORAL | Status: DC
Start: 2013-04-04 — End: 2015-08-26

## 2013-04-04 MED ORDER — METRONIDAZOLE 500 MG PO TABS: 500.0000 mg | ORAL_TABLET | Freq: Three times a day (TID) | ORAL | Status: DC

## 2013-04-04 MED ORDER — DSS 100 MG PO CAPS: 100.0000 mg | ORAL_CAPSULE | Freq: Two times a day (BID) | ORAL | Status: DC

## 2013-04-04 NOTE — Discharge Instructions (Signed)
Follow with Primary MD [name], MD in 7 days   Get CBC, CMP, checked 7 days by Primary MD and again as instructed by your Primary MD. Get a 2 view Chest X ray done next visit if you had Pneumonia of Lung problems at the Hospital.   Activity: As tolerated with Full fall precautions use walker/cane & assistance as needed   Disposition Home     Diet: soft diet for the next 2-3 days and gradually advance as tolerated.  For Heart failure patients - Check your Weight same time everyday, if you gain over 2 pounds, or you develop in leg swelling, experience more shortness of breath or chest pain, call your Primary MD immediately. Follow Cardiac Low Salt Diet and 1.8 lit/day fluid restriction.   On your next visit with her primary care physician please Get Medicines reviewed and adjusted.  Please request your Prim.MD to go over all Hospital Tests and Procedure/Radiological results at the follow up, please get all Hospital records sent to your Prim MD by signing hospital release before you go home.   If you experience worsening of your admission symptoms, develop shortness of breath, life threatening emergency, suicidal or homicidal thoughts you must seek medical attention immediately by calling 911 or calling your MD immediately  if symptoms less severe.  You Must read complete instructions/literature along with all the possible adverse reactions/side effects for all the Medicines you take and that have been prescribed to you. Take any new Medicines after you have completely understood and accpet all the possible adverse reactions/side effects.   Do not drive and provide baby sitting services if your were admitted for syncope or siezures until you have seen by Primary MD or a Neurologist and advised to do so again.  Do not drive when taking Pain medications.    Do not take more than prescribed Pain, Sleep and Anxiety Medications  Special Instructions: If you have smoked or chewed  Tobacco  in the last 2 yrs please stop smoking, stop any regular Alcohol  and or any Recreational drug use.  Wear Seat belts while driving.   Please note  You were cared for by a hospitalist during your hospital stay. If you have any questions about your discharge medications or the care you received while you were in the hospital after you are discharged, you can call the unit and asked to speak with the hospitalist on call if the hospitalist that took care of you is not available. Once you are discharged, your primary care physician will handle any further medical issues. Please note that NO REFILLS for any discharge medications will be authorized once you are discharged, as it is imperative that you return to your primary care physician (or establish a relationship with a primary care physician if you do not have one) for your aftercare needs so that they can reassess your need for medications and monitor your lab values.

## 2013-04-04 NOTE — Plan of Care (Signed)
Problem: Discharge Progression Outcomes Goal: Tolerating diet Outcome: Adequate for Discharge Place on a soft diet when she goes home. Patient talk to a nutritionist about soft diet.

## 2013-04-04 NOTE — Discharge Summary (Signed)
[name], is a 43 y.o. female  DOB [birth year]  MRN [id number].  Admission date:  04/02/2013  Admitting Physician  Thurnell Lose, MD  Discharge Date:  04/04/2013   Primary MD  [name], MD  Recommendations for primary care physician for things to follow:   Follow clinically, please review final stool PCR and repeat CBC,BMP in 4 days   Admission Diagnosis  Diarrhea [787.91] Back pain [724.5] Nausea [787.02] Abdominal pain [789.00] Asthma [493.90] Acute diverticulitis [562.11]   Discharge Diagnosis  Diarrhea [787.91] Back pain [724.5] Nausea [787.02] Abdominal pain [789.00] Asthma [493.90] Acute diverticulitis [562.11]     Active Problems:   Asthma   Abdominal pain   Acute diverticulitis      Past Medical History  Diagnosis Date  . Abdominal pain   . Hyperlipidemia   . Nausea   . Neuromuscular disorder     LUMBA BACK  TROUBLE   . Rash     UNDER BIL BREASTS  . Angina 05/2011    NO HEART PROBLEM WENT TO ED 05/22/11 FOR CP STRESS TEST WAS NEGATIVE   . Allergy-induced asthma   . H/O hiatal hernia   . GERD (gastroesophageal reflux disease)   . Chronic back pain greater than 3 months duration 01/17/2011    injured lumbar and posterior cervical neck in fall     Past Surgical History  Procedure Laterality Date  . Colonoscopy  07/2010    Removal of 3 polyps  . Wisdom tooth extraction  1990's  . Ventral hernia repair  06/12/2011    Procedure: LAPAROSCOPIC VENTRAL HERNIA;  Surgeon: Gwenyth Ober, MD;  Location: [place];  Service: General;  Laterality: N/A;  . Fracture surgery  06/12/11  . Cholecystectomy  08/2010  . Tubal ligation  12/1995  . Breast cyst excision Left ~ 1983  . Hernia repair  0000000; [id number]    umbilical corrected during tubal ligation; VHR w/mesh     Discharge Condition:  stable   Follow UP  Follow-up Information   Follow up with SANDERS,ROBYN N, MD. Schedule an appointment as soon as possible for a visit in 4 days.   Specialty:  Internal Medicine   Contact information:   [address] [place] [address] [phone]       Follow up with MANN,JYOTHI, MD. Schedule an appointment as soon as possible for a visit in 1 week.   Specialty:  Gastroenterology   Contact information:   [address] [place] [address] [phone]         Discharge Instructions  and  Discharge Medications          Discharge Orders   Future Orders Complete By Expires   Discharge instructions  As directed    Comments:     Follow with Primary MD [name], MD in 7 days   Get CBC, CMP, checked 7 days by Primary MD and again as instructed by your Primary MD. Get a 2 view Chest X ray  done next visit if you had Pneumonia of Lung problems at the Hospital.   Activity: As tolerated with Full fall precautions use walker/cane & assistance as needed   Disposition Home     Diet: soft diet for the next 2-3 days and gradually advance as tolerated.  For Heart failure patients - Check your Weight same time everyday, if you gain over 2 pounds, or you develop in leg swelling, experience more shortness of breath or chest pain, call your Primary MD immediately. Follow Cardiac Low Salt Diet and 1.8 lit/day fluid restriction.   On your next visit with her primary care physician please Get Medicines reviewed and adjusted.  Please request your Prim.MD to go over all Hospital Tests and Procedure/Radiological results at the follow up, please get all Hospital records sent to your Prim MD by signing hospital release before you go home.   If you experience worsening of your admission symptoms, develop shortness of breath, life threatening emergency, suicidal or homicidal thoughts you must seek medical attention immediately by calling 911 or  calling your MD immediately  if symptoms less severe.  You Must read complete instructions/literature along with all the possible adverse reactions/side effects for all the Medicines you take and that have been prescribed to you. Take any new Medicines after you have completely understood and accpet all the possible adverse reactions/side effects.   Do not drive and provide baby sitting services if your were admitted for syncope or siezures until you have seen by Primary MD or a Neurologist and advised to do so again.  Do not drive when taking Pain medications.    Do not take more than prescribed Pain, Sleep and Anxiety Medications  Special Instructions: If you have smoked or chewed Tobacco  in the last 2 yrs please stop smoking, stop any regular Alcohol  and or any Recreational drug use.  Wear Seat belts while driving.   Please note  You were cared for by a hospitalist during your hospital stay. If you have any questions about your discharge medications or the care you received while you were in the hospital after you are discharged, you can call the unit and asked to speak with the hospitalist on call if the hospitalist that took care of you is not available. Once you are discharged, your primary care physician will handle any further medical issues. Please note that NO REFILLS for any discharge medications will be authorized once you are discharged, as it is imperative that you return to your primary care physician (or establish a relationship with a primary care physician if you do not have one) for your aftercare needs so that they can reassess your need for medications and monitor your lab values.   Increase activity slowly  As directed        Medication List         albuterol 108 (90 BASE) MCG/ACT inhaler  Commonly known as:  PROVENTIL HFA;VENTOLIN HFA  Inhale 2 puffs into the lungs every 6 (six) hours as needed. For shortness of breath     cetirizine 10 MG tablet  Commonly  known as:  ZYRTEC  Take 10 mg by mouth daily.     ciprofloxacin 500 MG tablet  Commonly known as:  CIPRO  Take 1 tablet (500 mg total) by mouth 2 (two) times daily.     diphenhydrAMINE 25 mg capsule  Commonly known as:  BENADRYL  Take 25 mg by mouth every 6 (six) hours as needed. For allergy symptoms  DSS 100 MG Caps  Take 100 mg by mouth 2 (two) times daily.     esomeprazole 40 MG capsule  Commonly known as:  NEXIUM  Take 40 mg by mouth daily before breakfast.     fluconazole 100 MG tablet  Commonly known as:  DIFLUCAN  Take 1 tablet (100 mg total) by mouth daily.     HYDROcodone-acetaminophen 5-325 MG per tablet  Commonly known as:  NORCO/VICODIN  Take 1 tablet by mouth every 6 (six) hours as needed for moderate pain.     metroNIDAZOLE 500 MG tablet  Commonly known as:  FLAGYL  Take 1 tablet (500 mg total) by mouth 3 (three) times daily.     ranitidine 150 MG tablet  Commonly known as:  ZANTAC  Take 150 mg by mouth 3 (three) times daily before meals.     Vitamin D (Ergocalciferol) 50000 UNITS Caps capsule  Commonly known as:  DRISDOL  Take 50,000 Units by mouth 2 (two) times a week. Tuesday & friday          Diet and Activity recommendation: See Discharge Instructions above   Consults obtained -     Major procedures and Radiology Reports - PLEASE review detailed and final reports for all details, in brief -       Ct Abdomen Pelvis W Contrast  04/02/2013   CLINICAL DATA:  Abdominal pain.  EXAM: CT ABDOMEN AND PELVIS WITH CONTRAST  TECHNIQUE: Multidetector CT imaging of the abdomen and pelvis was performed using the standard protocol following bolus administration of intravenous contrast.  CONTRAST:  151mL OMNIPAQUE IOHEXOL 300 MG/ML  SOLN  COMPARISON:  CT ABD W/CM dated 03/31/2011  FINDINGS: Lung bases show dependent atelectasis bilaterally. Heart size is at the upper limits of normal. No pericardial or pleural effusion.  Liver is unremarkable.  Cholecystectomy. Adrenal glands are unremarkable. Low-attenuation lesions in the kidneys measure up to 1.9 cm in the upper pole right kidney, similar to the prior exam. Statistically, cysts are likely. Spleen, pancreas, stomach and small bowel are unremarkable. Wall thickening and pericolonic inflammatory stranding are seen in the sigmoid colon. Small amount of adjacent fluid, including interloop fluid (image 88). No organized fluid collection or extraluminal air. Colon is otherwise unremarkable.  Contour abnormalities along the uterine fundus are indicative of fibroids, measuring up to approximately 1.9 cm (image 88). Ovaries are visualized. No dependent pelvic free fluid. No pathologically enlarged lymph nodes. Retroperitoneal lymph nodes are subcentimeter in short axis size. Periumbilical hernias contain fat and omentum. Small right inguinal hernia contains fat. No worrisome lytic or sclerotic lesions.  IMPRESSION: 1. Sigmoid diverticulitis with small amount of pericolonic stranding and fluid. No abscess or extraluminal air. 2. Periumbilical and small right inguinal hernias. 3. Probable uterine fibroids.   Electronically Signed   By: Lorin Picket M.D.   On: 04/02/2013 08:22    Micro Results      No results found for this or any previous visit (from the past 240 hour(s)).   History of present illness and  Hospital Course:     Kindly see H&P for history of present illness and admission details, please review complete Labs, Consult reports and Test reports for all details in brief [name], is a 43 y.o. female, patient with history of  Diverticulosis was admitted for Abdominal pain due to mild Acute Diverticulitis, afebrile, no Leukocytosis, had minial tenderness and some CT changes, was treated with bowel rest for 1 day then soft diet IV ABX and  IV Fluids, now clinically almost back to baseline, she feels a whole lot better, mildly anxious but in no discomfort, will DC with 12 more days of  PO ABX, she will follow with PCP and GI post DC.    Note GI pathogen PCR pending but no diarrhea, fevers, ABD tenderness, please follow final results next visit.       Asthma. No acute issues.. No wheezing on exam.     GERD. Continue home regimen.     Low K - replaced and stable       Today   Subjective:   [name] today has no headache,no chest abdominal pain,no new weakness tingling or numbness, feels much better wants to go home today.    Objective:   Blood pressure 130/81, pulse 78, temperature 97.5 F (36.4 C), temperature source Oral, resp. rate 20, height 5\' 9"  (1.753 m), weight 106.142 kg (234 lb), last menstrual period 03/23/2013, SpO2 96.00%.   Intake/Output Summary (Last 24 hours) at 04/04/13 0753 Last data filed at 04/03/13 2201  Gross per 24 hour  Intake    960 ml  Output   2650 ml  Net  -1690 ml    Exam Awake Alert, Oriented *3, No new F.N deficits, Normal affect [place].AT,PERRAL Supple Neck,No JVD, No cervical lymphadenopathy appriciated.  Symmetrical Chest wall movement, Good air movement bilaterally, CTAB RRR,No Gallops,Rubs or new Murmurs, No Parasternal Heave +ve B.Sounds, Abd Soft, Non tender, No organomegaly appriciated, No rebound -guarding or rigidity. No Cyanosis, Clubbing or edema, No new Rash or bruise  Data Review   CBC w Diff: Lab Results  Component Value Date   WBC 7.2 04/03/2013   HGB 11.7* 04/03/2013   HCT 35.2* 04/03/2013   PLT 175 04/03/2013   LYMPHOPCT 15 04/02/2013   MONOPCT 6 04/02/2013   EOSPCT 0 04/02/2013   BASOPCT 0 04/02/2013    CMP: Lab Results  Component Value Date   NA 140 04/03/2013   K 3.9 04/04/2013   CL 103 04/03/2013   CO2 23 04/03/2013   BUN 6 04/03/2013   CREATININE 0.74 04/03/2013   PROT 7.3 04/02/2013   ALBUMIN 4.0 04/02/2013   BILITOT 0.4 04/02/2013   ALKPHOS 60 04/02/2013   AST 15 04/02/2013   ALT 10 04/02/2013  .   Total Time in preparing paper work, data evaluation and todays exam - 35  minutes  Thurnell Lose M.D on 04/04/2013 at 7:53 AM  [facility] Office  [phone]

## 2013-04-04 NOTE — Progress Notes (Signed)
Discharge instructions were done and went over with patient. Patient was given prescriptions for flagyl, cipro, diflucan, and colace to take to her pharmacy. Patient is waiting to talk to the nutritionist about soft diet for when she goes home.

## 2013-04-04 NOTE — Progress Notes (Signed)
Nutrition Education Note  RD consulted for nutrition education regarding nutrition management for diverticulosis/ Diverticulitis.    RD provided "Low Fiber Nutrition Therapy" handout as well as "5 Sample Menus for Gradually Increasing Fiber" handout from the Academy of [employer] and Dietetics. Reviewed patient's dietary recall and discussed ways for pt to meet nutrition goals over the next several weeks. Explained reasons for pt to follow a low fiber diet over the next 1 week and to then gradually increase fiber in the diet over the following 3-4 weeks. Reviewed low fiber foods and high fiber foods. Discussed best practice for long term management of diverticulosis is a high fiber diet and discussed ways to gradually increased fiber in the diet.   Teach back method used. Pt verbalizes understanding of information provided.   Expect good compliance.  Body mass index is Body mass index is 34.54 kg/(m^2).Marland Kitchen Pt meets criteria for Obesity based on current BMI.  Current diet order is Full Liquid, patient is consuming approximately 75% of meals at this time. Labs and medications reviewed. No further nutrition interventions warranted at this time. RD contact information provided. If additional nutrition issues arise, please re-consult RD.  Pryor Ochoa RD, LDN Inpatient Clinical Dietitian Pager: [phone] After Hours Pager: [phone]

## 2013-04-06 LAB — GI PATHOGEN PANEL BY PCR, STOOL
C difficile toxin A/B: NEGATIVE
Campylobacter by PCR: NEGATIVE
Cryptosporidium by PCR: NEGATIVE
E coli (ETEC) LT/ST: NEGATIVE
E coli (STEC): NEGATIVE
E coli 0157 by PCR: NEGATIVE
G lamblia by PCR: NEGATIVE
Norovirus GI/GII: NEGATIVE
Rotavirus A by PCR: NEGATIVE
Salmonella by PCR: NEGATIVE
Shigella by PCR: NEGATIVE

## 2013-04-11 ENCOUNTER — Other Ambulatory Visit: Payer: Self-pay | Admitting: Internal Medicine

## 2013-04-11 DIAGNOSIS — N644 Mastodynia: Secondary | ICD-10-CM

## 2013-04-24 ENCOUNTER — Ambulatory Visit
Admission: RE | Admit: 2013-04-24 | Discharge: 2013-04-24 | Disposition: A | Discharge status: Home / Self Care | Payer: BC Managed Care – PPO | Source: Ambulatory Visit | Attending: Internal Medicine | Admitting: Internal Medicine

## 2013-04-24 DIAGNOSIS — N644 Mastodynia: Secondary | ICD-10-CM

## 2013-08-14 ENCOUNTER — Encounter: Payer: BC Managed Care – PPO | Attending: Internal Medicine

## 2013-08-14 VITALS — Ht 69.0 in | Wt 224.2 lb

## 2013-08-14 DIAGNOSIS — E119 Type 2 diabetes mellitus without complications: Secondary | ICD-10-CM

## 2013-08-14 DIAGNOSIS — Z713 Dietary counseling and surveillance: Secondary | ICD-10-CM | POA: Insufficient documentation

## 2013-08-14 NOTE — Progress Notes (Signed)
Patient was seen on 08/14/13 for the first of a series of three diabetes self-management courses at the [facility].  Current HbA1c: 6.8%  The following learning objectives were met by the patient during this class:  Describe diabetes  State some common risk factors for diabetes  Defines the role of glucose and insulin  Identifies type of diabetes and pathophysiology  Describe the relationship between diabetes and cardiovascular risk  State the members of the Healthcare Team  States the rationale for glucose monitoring  State when to test glucose  State their individual Target Range  State the importance of logging glucose readings  Describe how to interpret glucose readings  Identifies A1C target  Explain the correlation between A1c and eAG values  State symptoms and treatment of high blood glucose  State symptoms and treatment of low blood glucose  Explain proper technique for glucose testing  Identifies proper sharps disposal  Handouts given during class include:  Living Well with Diabetes book  Carb Counting and Meal Planning book  Meal Plan Card  Carbohydrate guide  Meal planning worksheet  Low Sodium Flavoring Tips  The diabetes portion plate  Y0F to eAG Conversion Chart  Diabetes Medications  Diabetes Recommended Care Schedule  Support Group  Diabetes Success Plan  Core Class Satisfaction Survey  Follow-Up Plan:  Attend core 2

## 2013-08-21 ENCOUNTER — Encounter: Payer: BC Managed Care – PPO | Attending: Internal Medicine

## 2013-08-21 DIAGNOSIS — E119 Type 2 diabetes mellitus without complications: Secondary | ICD-10-CM | POA: Insufficient documentation

## 2013-08-21 DIAGNOSIS — Z713 Dietary counseling and surveillance: Secondary | ICD-10-CM | POA: Insufficient documentation

## 2013-08-22 NOTE — Progress Notes (Signed)

## 2013-08-28 DIAGNOSIS — E119 Type 2 diabetes mellitus without complications: Secondary | ICD-10-CM | POA: Diagnosis not present

## 2013-09-01 NOTE — Progress Notes (Signed)
Patient was seen on 08/28/13 for the third of a series of three diabetes self-management courses at the [facility]. The following learning objectives were met by the patient during this class:    State the amount of activity recommended for healthy living   Describe activities suitable for individual needs   Identify ways to regularly incorporate activity into daily life   Identify barriers to activity and ways to over come these barriers  Identify diabetes medications being personally used and their primary action for lowering glucose and possible side effects   Describe role of stress on blood glucose and develop strategies to address psychosocial issues   Identify diabetes complications and ways to prevent them  Explain how to manage diabetes during illness   Evaluate success in meeting personal goal   Establish 2-3 goals that they will plan to diligently work on until they return for the  26-monthfollow-up visit  Goals:  Follow Diabetes Meal Plan as instructed  Aim for 15-30 mins of physical activity daily as tolerated  Bring food record and glucose log to your follow up visit  Your patient has established the following 4 month goals in their individualized success plan: I will count my carb choices at most meals and snacks I will increase my activity level at least 5 days a week  Your patient has identified these potential barriers to change:  Motivation  finances  stress Your patient has identified their diabetes self-care support plan as  [facility]  Plan:  Attend Core 4 in 4 months

## 2013-12-22 ENCOUNTER — Encounter: Payer: BC Managed Care – PPO | Attending: Internal Medicine

## 2013-12-22 DIAGNOSIS — E119 Type 2 diabetes mellitus without complications: Secondary | ICD-10-CM | POA: Insufficient documentation

## 2013-12-22 DIAGNOSIS — Z713 Dietary counseling and surveillance: Secondary | ICD-10-CM | POA: Diagnosis not present

## 2013-12-22 NOTE — Progress Notes (Signed)
Appt start time: 1730 end time:  1830.  Patient was seen on 12/22/13 for a review of the series of three diabetes self-management courses at the [facility]. The following learning objectives were met by the patient during this class:  . Reviewed blood glucose monitoring and interpretation including the recommended target ranges and Hgb A1c.  . Reviewed on carb counting, importance of regularly scheduled meals/snacks, and meal planning.  . Reviewed the effects of physical activity on glucose levels and long-term glucose control.  Recommended goal of 150 minutes of physical activity/week. . Reviewed patient medications and discussed role of medication on blood glucose and possible side effects. . Discussed strategies to manage stress, psychosocial issues, and other obstacles to diabetes management. . Encouraged moderate weight reduction to improve glucose levels.   . Reviewed short-term complications: hyper- and hypo-glycemia.  Discussed causes, symptoms, and treatment options. . Reviewed prevention, detection, and treatment of long-term complications.  Discussed the role of prolonged elevated glucose levels on body systems.  Goals:  Follow Diabetes Meal Plan as instructed  Eat 3 meals and 2 snacks, every 3-5 hrs  Limit carbohydrate intake to 45 grams carbohydrate/meal Limit carbohydrate intake to 15 grams carbohydrate/snack Add lean protein foods to meals/snacks  Monitor glucose levels as instructed by your doctor  Aim for goal of 15-30 mins of physical activity daily as tolerated  Bring food record and glucose log to your next nutrition visit   

## 2014-01-08 ENCOUNTER — Other Ambulatory Visit: Payer: Self-pay | Admitting: Internal Medicine

## 2014-01-08 DIAGNOSIS — N644 Mastodynia: Secondary | ICD-10-CM

## 2014-01-12 ENCOUNTER — Other Ambulatory Visit: Payer: Self-pay | Admitting: Internal Medicine

## 2014-01-12 DIAGNOSIS — N644 Mastodynia: Secondary | ICD-10-CM

## 2014-01-23 ENCOUNTER — Ambulatory Visit
Admission: RE | Admit: 2014-01-23 | Discharge: 2014-01-23 | Disposition: A | Discharge status: Home / Self Care | Payer: BC Managed Care – PPO | Source: Ambulatory Visit | Attending: Internal Medicine | Admitting: Internal Medicine

## 2014-01-23 DIAGNOSIS — N644 Mastodynia: Secondary | ICD-10-CM

## 2014-03-24 ENCOUNTER — Other Ambulatory Visit: Payer: Self-pay

## 2014-03-24 DIAGNOSIS — Z1231 Encounter for screening mammogram for malignant neoplasm of breast: Secondary | ICD-10-CM

## 2014-04-27 ENCOUNTER — Ambulatory Visit: Payer: BC Managed Care – PPO

## 2014-05-01 ENCOUNTER — Ambulatory Visit
Admission: RE | Admit: 2014-05-01 | Discharge: 2014-05-01 | Disposition: A | Discharge status: Home / Self Care | Payer: BC Managed Care – PPO | Source: Ambulatory Visit

## 2014-05-01 DIAGNOSIS — Z1231 Encounter for screening mammogram for malignant neoplasm of breast: Secondary | ICD-10-CM

## 2014-05-14 ENCOUNTER — Ambulatory Visit (INDEPENDENT_AMBULATORY_CARE_PROVIDER_SITE_OTHER): Payer: BC Managed Care – PPO | Admitting: Emergency Medicine

## 2014-05-14 VITALS — BP 116/84 | HR 78 | Temp 97.6°F | Resp 16 | Ht 69.0 in

## 2014-05-14 DIAGNOSIS — R079 Chest pain, unspecified: Secondary | ICD-10-CM | POA: Diagnosis not present

## 2014-05-14 NOTE — Progress Notes (Signed)
[facility] [address], [place] [address] [phone]  Date:  05/14/2014   Name:  [name]   DOB:  Jun 05, 1970   MRN:  [id number]  PCP:  [name], MD    Chief Complaint: Headache and Dizziness   History of Present Illness:  [name] is a 44 y.o. very pleasant female patient who presents with the following:  Patient says she was at her pain medicine doctor's office this morning and they checked her blood pressure on her forearm Her pressure was elevated with a diastolic pressure 616 She has been anxious and worked up all day and now has a headache Now she is complaining of a sharp lancinating chest pain that has been present for an hour or so. She has no diaphoresis, shortness of breath, nausea or vomiting. Not radiating No tachycardia nor sensation of rapid or irregular heartbeat. Non smoker, No history of HBP or DM No improvement with over the counter medications or other home remedies. Denies other complaint or health concern today.   Patient Active Problem List   Diagnosis Date Noted  . Acute diverticulitis 04/02/2013  . Back pain   . Hiatal hernia 04/23/2012  . Abdominal pain 03/26/2012  . Incisional hernia 04/21/2011  . Postop check 10/04/2010  . Colon polyp 08/23/2010  . Poor appetite 08/23/2010  . Abdominal pain 08/23/2010  . High cholesterol 08/23/2010  . Bruises easily 08/23/2010  . Asthma 08/23/2010  . Wears glasses 08/23/2010  . Biliary dyskinesia 08/23/2010    Past Medical History  Diagnosis Date  . Abdominal pain   . Hyperlipidemia   . Nausea   . Neuromuscular disorder     LUMBA BACK  TROUBLE   . Rash     UNDER BIL BREASTS  . Angina 05/2011    NO HEART PROBLEM WENT TO ED 05/22/11 FOR CP STRESS TEST WAS NEGATIVE   . Allergy-induced asthma   . H/O hiatal hernia   . GERD (gastroesophageal reflux disease)   . Chronic back pain greater than 3 months duration 01/17/2011    injured lumbar and posterior  cervical neck in fall   . Diverticulitis   . Diabetes mellitus without complication   . Allergy     Past Surgical History  Procedure Laterality Date  . Colonoscopy  07/2010    Removal of 3 polyps  . Wisdom tooth extraction  1990's  . Ventral hernia repair  06/12/2011    Procedure: LAPAROSCOPIC VENTRAL HERNIA;  Surgeon: Gwenyth Ober, MD;  Location: [place];  Service: General;  Laterality: N/A;  . Fracture surgery  06/12/11  . Cholecystectomy  08/2010  . Tubal ligation  12/1995  . Breast cyst excision Left ~ 1983  . Hernia repair  0737; 02/624    umbilical corrected during tubal ligation; VHR w/mesh    History  Substance Use Topics  . Smoking status: Never Smoker   . Smokeless tobacco: Never Used  . Alcohol Use: No    Family History  Problem Relation Age of Onset  . Diabetes Mother   . Heart disease Father   . Cancer Father     kidney and prostate  . Hyperlipidemia Father   . Other Father     gout and clogged arteries  . Hyperlipidemia Sister   . Diabetes Sister   . Diabetes Brother   . Hyperlipidemia Brother   . Heart disease Maternal Grandmother     Allergies  Allergen Reactions  . Amoxicillin  Hives  . Sulfa Antibiotics Hives  . Penicillins Hives and Itching  . Tape     PLASTIC TAPE   (WHELPS, TORE SKIN)    Medication list has been reviewed and updated.  Current Outpatient Prescriptions on File Prior to Visit  Medication Sig Dispense Refill  . acetaminophen-codeine (TYLENOL #3) 300-30 MG per tablet Take by mouth every 4 (four) hours as needed for moderate pain.    Marland Kitchen albuterol (PROVENTIL HFA;VENTOLIN HFA) 108 (90 BASE) MCG/ACT inhaler Inhale 2 puffs into the lungs every 6 (six) hours as needed. For shortness of breath    . cetirizine (ZYRTEC) 10 MG tablet Take 10 mg by mouth daily.      . diphenhydrAMINE (BENADRYL) 25 mg capsule Take 25 mg by mouth every 6 (six) hours as needed. For allergy symptoms    . fexofenadine (ALLEGRA) 30 MG tablet Take 30 mg by mouth  2 (two) times daily.    . ranitidine (ZANTAC) 150 MG tablet Take 150 mg by mouth 3 (three) times daily before meals.    . Vitamin D, Ergocalciferol, (DRISDOL) 50000 UNITS CAPS Take 50,000 Units by mouth 2 (two) times a week. Tuesday & friday    . ciprofloxacin (CIPRO) 500 MG tablet Take 1 tablet (500 mg total) by mouth 2 (two) times daily. (Patient not taking: Reported on 05/14/2014) 22 tablet 0  . docusate sodium 100 MG CAPS Take 100 mg by mouth 2 (two) times daily. (Patient not taking: Reported on 05/14/2014) 10 capsule 0  . esomeprazole (NEXIUM) 40 MG capsule Take 40 mg by mouth daily before breakfast.    . fluconazole (DIFLUCAN) 100 MG tablet Take 1 tablet (100 mg total) by mouth daily. (Patient not taking: Reported on 05/14/2014) 3 tablet 0  . HYDROcodone-acetaminophen (NORCO/VICODIN) 5-325 MG per tablet Take 1 tablet by mouth every 6 (six) hours as needed for moderate pain.    . metroNIDAZOLE (FLAGYL) 500 MG tablet Take 1 tablet (500 mg total) by mouth 3 (three) times daily. (Patient not taking: Reported on 05/14/2014) 36 tablet 0  . Polyethylene Glycol 3350 (MIRALAX PO) Take by mouth.     No current facility-administered medications on file prior to visit.    Review of Systems:  As per HPI, otherwise negative.    Physical Examination: Filed Vitals:   05/14/14 1508  BP: 116/84  Pulse: 78  Temp: 97.6 F (36.4 C)  Resp: 16   Filed Vitals:   05/14/14 1508  Height: 5\' 9"  (1.753 m)   There is no weight on file to calculate BMI. Ideal Body Weight: Weight in (lb) to have BMI = 25: 168.9  GEN: obese, NAD, Non-toxic, A & O x 3 HEENT: Atraumatic, Normocephalic. Neck supple. No masses, No LAD. Ears and Nose: No external deformity. CV: RRR, No M/G/R. No JVD. No thrill. No extra heart sounds. PULM: CTA B, no wheezes, crackles, rhonchi. No retractions. No resp. distress. No accessory muscle use. ABD: S, NT, ND, +BS. No rebound. No HSM. EXTR: No c/c/e NEURO Normal gait.  PSYCH: Normally  interactive. Conversant. Not depressed or anxious appearing.  Calm demeanor.    Assessment and Plan: No evidence hypertension Encouraged to lose weight and exercise Signed,  Ellison Carwin, MD

## 2014-05-14 NOTE — Patient Instructions (Signed)

## 2014-08-17 ENCOUNTER — Other Ambulatory Visit: Payer: Self-pay

## 2015-08-20 ENCOUNTER — Other Ambulatory Visit: Payer: Self-pay | Admitting: Obstetrics and Gynecology

## 2015-08-27 NOTE — Patient Instructions (Addendum)
Your procedure is scheduled on:  Friday, September 03, 2015  Enter through the Main Entrance of [facility] at:  8:30 AM  Pick up the phone at the desk and dial [phone].  Call this number if you have problems the morning of surgery: [phone].  Remember: Do NOT eat food or drink after:  Midnight Thursday  Take these medicines the morning of surgery with a SIP OF WATER:  Zantac  Bring Asthma Inhaler day of surgery  Stop taking fish oil at this time  Do NOT wear jewelry (body piercing), metal hair clips/bobby pins, make-up, or nail polish. Do NOT wear lotions, powders, or perfumes.  You may wear deodorant. Do NOT shave for 48 hours prior to surgery. Do NOT bring valuables to the hospital. Contacts, dentures, or bridgework may not be worn into surgery.  Have a responsible adult drive you home and stay with you for 24 hours after your procedure

## 2015-08-30 ENCOUNTER — Encounter (HOSPITAL_COMMUNITY)
Admission: RE | Admit: 2015-08-30 | Discharge: 2015-08-30 | Disposition: A | Discharge status: Home / Self Care | Payer: BC Managed Care – PPO | Source: Ambulatory Visit | Attending: Obstetrics and Gynecology | Admitting: Obstetrics and Gynecology

## 2015-08-30 ENCOUNTER — Encounter (HOSPITAL_COMMUNITY): Payer: Self-pay

## 2015-08-30 DIAGNOSIS — Z01812 Encounter for preprocedural laboratory examination: Secondary | ICD-10-CM | POA: Insufficient documentation

## 2015-08-30 HISTORY — DX: Fracture of unspecified carpal bone, unspecified wrist, initial encounter for closed fracture: S62.109A

## 2015-08-30 HISTORY — DX: Prediabetes: R73.03

## 2015-08-30 HISTORY — DX: Other fracture of unspecified lower leg, initial encounter for closed fracture: S82.899A

## 2015-08-30 HISTORY — DX: Other cervical disc degeneration, unspecified cervical region: M50.30

## 2015-08-30 LAB — BASIC METABOLIC PANEL
Anion gap: 6 (ref 5–15)
BUN: 18 mg/dL (ref 6–20)
CO2: 29 mmol/L (ref 22–32)
Calcium: 9.5 mg/dL (ref 8.9–10.3)
Chloride: 102 mmol/L (ref 101–111)
Creatinine, Ser: 0.68 mg/dL (ref 0.44–1.00)
GFR calc Af Amer: >60 mL/min (ref >60)
GFR calc non Af Amer: >60 mL/min (ref >60)
Glucose, Bld: 81 mg/dL (ref 65–99)
Potassium: 4.4 mmol/L (ref 3.5–5.1)
SODIUM: 137 mmol/L (ref 135–145)

## 2015-08-30 LAB — CBC
HEMATOCRIT: 41.4 % (ref 36.0–46.0)
Hemoglobin: 13.6 g/dL (ref 12.0–15.0)
MCH: 27.1 pg (ref 26.0–34.0)
MCHC: 32.9 g/dL (ref 30.0–36.0)
MCV: 82.5 fL (ref 78.0–100.0)
Platelets: 203 10*3/uL (ref 150–400)
RBC: 5.02 MIL/uL (ref 3.87–5.11)
RDW: 13.5 % (ref 11.5–15.5)
WBC: 5.6 10*3/uL (ref 4.0–10.5)

## 2015-09-03 ENCOUNTER — Ambulatory Visit (HOSPITAL_COMMUNITY): Payer: BC Managed Care – PPO | Admitting: Anesthesiology

## 2015-09-03 ENCOUNTER — Ambulatory Visit (HOSPITAL_COMMUNITY)
Admission: RE | Admit: 2015-09-03 | Discharge: 2015-09-03 | Disposition: A | Discharge status: Home / Self Care | Payer: BC Managed Care – PPO | Source: Ambulatory Visit | Attending: Obstetrics and Gynecology | Admitting: Obstetrics and Gynecology

## 2015-09-03 ENCOUNTER — Encounter (HOSPITAL_COMMUNITY)
Admission: RE | Disposition: A | Discharge status: Home / Self Care | Payer: Self-pay | Source: Ambulatory Visit | Attending: Obstetrics and Gynecology

## 2015-09-03 ENCOUNTER — Encounter (HOSPITAL_COMMUNITY): Payer: Self-pay | Admitting: Emergency Medicine

## 2015-09-03 DIAGNOSIS — K219 Gastro-esophageal reflux disease without esophagitis: Secondary | ICD-10-CM | POA: Insufficient documentation

## 2015-09-03 DIAGNOSIS — N938 Other specified abnormal uterine and vaginal bleeding: Secondary | ICD-10-CM | POA: Insufficient documentation

## 2015-09-03 DIAGNOSIS — J449 Chronic obstructive pulmonary disease, unspecified: Secondary | ICD-10-CM | POA: Diagnosis not present

## 2015-09-03 DIAGNOSIS — Z6834 Body mass index (BMI) 34.0-34.9, adult: Secondary | ICD-10-CM | POA: Insufficient documentation

## 2015-09-03 HISTORY — PX: DILATATION & CURETTAGE/HYSTEROSCOPY WITH MYOSURE: SHX6511

## 2015-09-03 SURGERY — DILATATION & CURETTAGE/HYSTEROSCOPY WITH MYOSURE
Anesthesia: General

## 2015-09-03 MED ORDER — DEXAMETHASONE SODIUM PHOSPHATE 4 MG/ML IJ SOLN
INTRAMUSCULAR | Status: DC | PRN
  Administered 2015-09-03: 4 mg via INTRAVENOUS

## 2015-09-03 MED ORDER — ONDANSETRON HCL 4 MG/2ML IJ SOLN
INTRAMUSCULAR | Status: AC
  Filled 2015-09-03: qty 2

## 2015-09-03 MED ORDER — PROMETHAZINE HCL 25 MG/ML IJ SOLN: 6.2500 mg | INTRAMUSCULAR | Status: DC | PRN

## 2015-09-03 MED ORDER — SCOPOLAMINE 1 MG/3DAYS TD PT72
MEDICATED_PATCH | TRANSDERMAL | Status: AC
  Administered 2015-09-03: 1.5 mg via TRANSDERMAL
  Filled 2015-09-03: qty 1

## 2015-09-03 MED ORDER — MIDAZOLAM HCL 2 MG/2ML IJ SOLN
INTRAMUSCULAR | Status: AC
  Filled 2015-09-03: qty 2

## 2015-09-03 MED ORDER — LACTATED RINGERS IV SOLN
INTRAVENOUS | Status: DC
  Administered 2015-09-03 (×3): via INTRAVENOUS

## 2015-09-03 MED ORDER — LIDOCAINE HCL (CARDIAC) 20 MG/ML IV SOLN
INTRAVENOUS | Status: DC | PRN
  Administered 2015-09-03: 80 mg via INTRAVENOUS

## 2015-09-03 MED ORDER — FENTANYL CITRATE (PF) 100 MCG/2ML IJ SOLN
INTRAMUSCULAR | Status: DC | PRN
  Administered 2015-09-03 (×2): 50 ug via INTRAVENOUS

## 2015-09-03 MED ORDER — MIDAZOLAM HCL 2 MG/2ML IJ SOLN
INTRAMUSCULAR | Status: DC | PRN
  Administered 2015-09-03: 2 mg via INTRAVENOUS

## 2015-09-03 MED ORDER — PROPOFOL 10 MG/ML IV BOLUS
INTRAVENOUS | Status: AC
  Filled 2015-09-03: qty 20

## 2015-09-03 MED ORDER — SCOPOLAMINE 1 MG/3DAYS TD PT72
1.0000 | MEDICATED_PATCH | Freq: Once | TRANSDERMAL | Status: DC
  Administered 2015-09-03: 1.5 mg via TRANSDERMAL

## 2015-09-03 MED ORDER — MIDAZOLAM HCL 2 MG/2ML IJ SOLN: 0.5000 mg | Freq: Once | INTRAMUSCULAR | Status: DC | PRN

## 2015-09-03 MED ORDER — PROPOFOL 10 MG/ML IV BOLUS
INTRAVENOUS | Status: DC | PRN
  Administered 2015-09-03: 200 mg via INTRAVENOUS

## 2015-09-03 MED ORDER — LIDOCAINE HCL (CARDIAC) 20 MG/ML IV SOLN
INTRAVENOUS | Status: AC
  Filled 2015-09-03: qty 5

## 2015-09-03 MED ORDER — HYDROMORPHONE HCL 1 MG/ML IJ SOLN
INTRAMUSCULAR | Status: AC
  Filled 2015-09-03: qty 1

## 2015-09-03 MED ORDER — ONDANSETRON HCL 4 MG/2ML IJ SOLN
INTRAMUSCULAR | Status: DC | PRN
  Administered 2015-09-03: 4 mg via INTRAVENOUS

## 2015-09-03 MED ORDER — FENTANYL CITRATE (PF) 100 MCG/2ML IJ SOLN
INTRAMUSCULAR | Status: AC
  Filled 2015-09-03: qty 4

## 2015-09-03 MED ORDER — IBUPROFEN 800 MG PO TABS: 800.0000 mg | ORAL_TABLET | Freq: Three times a day (TID) | ORAL | Status: DC | PRN

## 2015-09-03 MED ORDER — DEXAMETHASONE SODIUM PHOSPHATE 4 MG/ML IJ SOLN
INTRAMUSCULAR | Status: AC
  Filled 2015-09-03: qty 1

## 2015-09-03 MED ORDER — HYDROMORPHONE HCL 1 MG/ML IJ SOLN
0.2500 mg | INTRAMUSCULAR | Status: DC | PRN
  Administered 2015-09-03: 0.5 mg via INTRAVENOUS

## 2015-09-03 MED ORDER — MEPERIDINE HCL 25 MG/ML IJ SOLN: 6.2500 mg | INTRAMUSCULAR | Status: DC | PRN

## 2015-09-03 SURGICAL SUPPLY — 20 items
CANISTER SUCT 3000ML (MISCELLANEOUS) ×2 IMPLANT
CATH ROBINSON RED A/P 16FR (CATHETERS) ×2 IMPLANT
CLOTH BEACON ORANGE TIMEOUT ST (SAFETY) ×2 IMPLANT
CONTAINER PREFILL 10% NBF 60ML (FORM) ×4 IMPLANT
DEVICE MYOSURE LITE (MISCELLANEOUS) IMPLANT
DEVICE MYOSURE REACH (MISCELLANEOUS) IMPLANT
ELECT REM PT RETURN 9FT ADLT (ELECTROSURGICAL) ×2
ELECTRODE REM PT RTRN 9FT ADLT (ELECTROSURGICAL) ×1 IMPLANT
FILTER ARTHROSCOPY CONVERTOR (FILTER) ×2 IMPLANT
GLOVE BIOGEL PI IND STRL 7.0 (GLOVE) ×2 IMPLANT
GLOVE BIOGEL PI INDICATOR 7.0 (GLOVE) ×2
GLOVE ECLIPSE 6.5 STRL STRAW (GLOVE) ×2 IMPLANT
GOWN STRL REUS W/TWL LRG LVL3 (GOWN DISPOSABLE) ×4 IMPLANT
PACK VAGINAL MINOR WOMEN LF (CUSTOM PROCEDURE TRAY) ×2 IMPLANT
PAD OB MATERNITY 4.3X12.25 (PERSONAL CARE ITEMS) ×2 IMPLANT
SEAL ROD LENS SCOPE MYOSURE (ABLATOR) ×2 IMPLANT
TOWEL OR 17X24 6PK STRL BLUE (TOWEL DISPOSABLE) ×4 IMPLANT
TUBING AQUILEX INFLOW (TUBING) ×2 IMPLANT
TUBING AQUILEX OUTFLOW (TUBING) ×2 IMPLANT
WATER STERILE IRR 1000ML POUR (IV SOLUTION) ×2 IMPLANT

## 2015-09-03 NOTE — Discharge Instructions (Signed)
DISCHARGE INSTRUCTIONS: HYSTEROSCOPY  The following instructions have been prepared to help you care for yourself upon your return home.  May Remove Scop patch on or before 09/06/15   Personal hygiene:  Use sanitary pads for vaginal drainage, not tampons.  Shower the day after your procedure.  NO tub baths, pools or Jacuzzis for 2-3 weeks.  Wipe front to back after using the bathroom.  Activity and limitations:  Do NOT drive or operate any equipment for 24 hours. The effects of anesthesia are still present and drowsiness may result.  Do NOT rest in bed all day.  Walking is encouraged.  Walk up and down stairs slowly.  You may resume your normal activity in one to two days or as indicated by your physician. Sexual activity: NO intercourse for at least 2 weeks after the procedure, or as indicated by your Doctor.  Diet: Eat a light meal as desired this evening. You may resume your usual diet tomorrow.  Return to Work: You may resume your work activities in one to two days or as indicated by Marine scientist.  What to expect after your surgery: Expect to have vaginal bleeding/discharge for 2-3 days and spotting for up to 10 days. It is not unusual to have soreness for up to 1-2 weeks. You may have a slight burning sensation when you urinate for the first day. Mild cramps may continue for a couple of days. You may have a regular period in 2-6 weeks.  Call your doctor for any of the following:  Excessive vaginal bleeding or clotting, saturating and changing one pad every hour.  Inability to urinate 6 hours after discharge from hospital.  Pain not relieved by pain medication.  Fever of 100.4 F or greater.  Unusual vaginal discharge or odor.  Return to office _________________Call for an appointment ___________________ Patients signature: ______________________ Nurses signature ________________________  [place] [facility] [phone]

## 2015-09-03 NOTE — Transfer of Care (Signed)
Immediate Anesthesia Transfer of Care Note  Patient: [name]  Procedure(s) Performed: Procedure(s): DILATATION & CURETTAGE/HYSTEROSCOPY WITH MYOSURE (N/A)  Patient Location: PACU  Anesthesia Type:General  Level of Consciousness: awake, alert , oriented and patient cooperative  Airway & Oxygen Therapy: Patient Spontanous Breathing and Patient connected to nasal cannula oxygen  Post-op Assessment: Report given to RN and Post -op Vital signs reviewed and stable  Post vital signs: Reviewed and stable  Last Vitals:  TEMP 97.4 HR 78 BP 126/89 RR 16 POX 100  Last Pain:  Pain level 0    Patients Stated Pain Goal: 2 ([id number])  Complications: No apparent anesthesia complications

## 2015-09-03 NOTE — Anesthesia Preprocedure Evaluation (Addendum)
Anesthesia Evaluation  Patient identified by MRN, date of birth, ID band Patient awake    Reviewed: Allergy & Precautions, NPO status , Patient's Chart, lab work & pertinent test results  History of Anesthesia Complications Negative for: history of anesthetic complications  Airway Mallampati: II  TM Distance: >3 FB Neck ROM: Full    Dental  (+) Dental Advisory Given   Pulmonary asthma , COPD,  COPD inhaler,    breath sounds clear to auscultation       Cardiovascular (-) angina Rhythm:Regular Rate:Normal  '13 stress test OK   Neuro/Psych negative neurological ROS     GI/Hepatic Neg liver ROS, GERD  Medicated and Controlled,  Endo/Other  Morbid obesity  Renal/GU negative Renal ROS     Musculoskeletal  (+) Arthritis ,   Abdominal (+) + obese,   Peds  Hematology negative hematology ROS (+)   Anesthesia Other Findings   Reproductive/Obstetrics                          Anesthesia Physical Anesthesia Plan  ASA: II  Anesthesia Plan: General   Post-op Pain Management:    Induction: Intravenous  Airway Management Planned: LMA  Additional Equipment:   Intra-op Plan:   Post-operative Plan:   Informed Consent: I have reviewed the patients History and Physical, chart, labs and discussed the procedure including the risks, benefits and alternatives for the proposed anesthesia with the patient or authorized representative who has indicated his/her understanding and acceptance.   Dental advisory given  Plan Discussed with: CRNA and Surgeon  Anesthesia Plan Comments: (Plan routine monitors, GA- LMA OK)        Anesthesia Quick Evaluation

## 2015-09-03 NOTE — Brief Op Note (Signed)
09/03/2015  10:56 AM  PATIENT:  [name]  45 y.o. female  PRE-OPERATIVE DIAGNOSIS:  Abnormal Uterine Bleeding, Fibroid Uterus  POST-OPERATIVE DIAGNOSIS:  Abnormal Uterine Bleeding, Fibroid Uterus  PROCEDURE:  Diagnostic hysteroscopy, D&C using myosure  SURGEON:  Surgeon(s) and Role:    * Servando Salina, MD - Primary  PHYSICIAN ASSISTANT:   ASSISTANTS: none   ANESTHESIA:   general  EBL:  Total I/O In: 1000 [I.V.:1000] Out: 22 [Urine:20; Blood:2]  BLOOD ADMINISTERED:none  DRAINS: none   LOCAL MEDICATIONS USED:  NONE  SPECIMEN:  Source of Specimen:  [facility]  DISPOSITION OF SPECIMEN:  PATHOLOGY  COUNTS:  YES  TOURNIQUET:  * No tourniquets in log *  DICTATION: .Other Dictation: Dictation Number [id number]  PLAN OF CARE: Discharge to home after PACU  PATIENT DISPOSITION:  PACU - hemodynamically stable.   Delay start of Pharmacological VTE agent (>24hrs) due to surgical blood loss or risk of bleeding: no

## 2015-09-03 NOTE — Anesthesia Postprocedure Evaluation (Signed)
Anesthesia Post Note  Patient: [name]  Procedure(s) Performed: Procedure(s) (LRB): DILATATION & CURETTAGE/HYSTEROSCOPY WITH MYOSURE (N/A)  Patient location during evaluation: PACU Anesthesia Type: General Level of consciousness: awake and alert, oriented and patient cooperative Pain management: pain level controlled Vital Signs Assessment: post-procedure vital signs reviewed and stable Respiratory status: spontaneous breathing, nonlabored ventilation and respiratory function stable Cardiovascular status: blood pressure returned to baseline and stable Postop Assessment: no signs of nausea or vomiting Anesthetic complications: no     Last Vitals:  Filed Vitals:   09/03/15 1222 09/03/15 1228  BP:    Pulse: 73   Temp:  36.6 C  Resp: 13     Last Pain:  Filed Vitals:   09/03/15 1229  PainSc: 4    Pain Goal: Patients Stated Pain Goal: 2 (09/03/15 0857)               Seleta Rhymes. Juancarlos Crescenzo

## 2015-09-03 NOTE — Anesthesia Procedure Notes (Signed)
Procedure Name: LMA Insertion Date/Time: 09/03/2015 10:22 AM Performed by: Brock Ra Pre-anesthesia Checklist: Patient identified, Emergency Drugs available, Suction available, Timeout performed and Patient being monitored Patient Re-evaluated:Patient Re-evaluated prior to inductionOxygen Delivery Method: Circle system utilized Preoxygenation: Pre-oxygenation with 100% oxygen Intubation Type: IV induction Ventilation: Mask ventilation without difficulty LMA: LMA inserted LMA Size: 3.0 Grade View: Grade I Number of attempts: 1 Placement Confirmation: positive ETCO2 and breath sounds checked- equal and bilateral Dental Injury: Teeth and Oropharynx as per pre-operative assessment

## 2015-09-04 NOTE — Op Note (Signed)
NAMEROSEZELLA, BARMES NO.:  [id number]  MEDICAL RECORD NO.:  [id number]  LOCATION:  [facility]                          FACILITY:  [facility]  PHYSICIAN:  Servando Salina, M.D.DATE OF BIRTH:  02-23-1970  DATE OF PROCEDURE:  09/03/2015 DATE OF DISCHARGE:  09/03/2015                              OPERATIVE REPORT   PREOPERATIVE DIAGNOSIS:  Abnormal dysfunctional uterine bleeding, Fibroid uterus.  PROCEDURE:  Diagnostic hysteroscopy, D and C using MyoSure.  POSTOPERATIVE DIAGNOSIS:  Abnormal uterine bleeding, Fibroid uterus.  ANESTHESIA:  General.  SURGEON:  Servando Salina, M.D.  ASSISTANT:  None.  DESCRIPTION OF PROCEDURE:  Under adequate general anesthesia, the patient was placed in the dorsal lithotomy position.  She was sterilely prepped and draped in usual fashion.  Bladder was catheterized small amount of urine.  Examination under anesthesia revealed anteverted uterus.  No adnexal masses could be appreciated.  Bivalve speculum placed in vagina.  Single-tooth tenaculum was placed on the anterior lip of the cervix.  The cervix was dilated up to #[facility] dilator, but entry through the internal os suggested that there was a very small uterus than what was expected. Nonetheless, a MyoSure primed hysteroscope was carefully introduced into the cavity.  At that point, the left ostia could be seen better than the right.  There was evidence of an indentation of the dilators in the fundal region, but no evidence of perforation.  No submucosal fibroid or endometrial polyps were noted. At that point, decision was then made to use the classic MyoSure resectoscope to assess the endometrial cavity so as not to perforate the uterus.  The endometrial cavity was resected circumferentially and at that point, when it was felt to have been adequately resected, all instruments were then removed from the vagina and a curette was not utilized.  SPECIMEN:  Labeled endometrial  curetting was sent to Pathology.  ESTIMATED BLOOD LOSS:  Minimal.  COMPLICATION:  None.  The patient tolerated the procedure well and was transferred to recovery in stable condition.     Servando Salina, M.D.     [place]/MEDQ  D:  09/04/2015  T:  09/04/2015  Job:  [id number]

## 2015-09-06 ENCOUNTER — Encounter (HOSPITAL_COMMUNITY): Payer: Self-pay | Admitting: Obstetrics and Gynecology

## 2016-01-28 LAB — HM COLONOSCOPY

## 2016-05-19 ENCOUNTER — Encounter: Payer: Self-pay | Admitting: Physician Assistant

## 2016-05-19 ENCOUNTER — Ambulatory Visit (INDEPENDENT_AMBULATORY_CARE_PROVIDER_SITE_OTHER): Payer: BC Managed Care – PPO | Admitting: Physician Assistant

## 2016-05-19 VITALS — BP 127/88 | HR 109 | Temp 101.5°F | Resp 16 | Ht 68.75 in | Wt 234.4 lb

## 2016-05-19 DIAGNOSIS — R0989 Other specified symptoms and signs involving the circulatory and respiratory systems: Secondary | ICD-10-CM

## 2016-05-19 DIAGNOSIS — J101 Influenza due to other identified influenza virus with other respiratory manifestations: Secondary | ICD-10-CM

## 2016-05-19 DIAGNOSIS — R5081 Fever presenting with conditions classified elsewhere: Secondary | ICD-10-CM

## 2016-05-19 LAB — POCT INFLUENZA A/B
Influenza A, POC: NEGATIVE
Influenza B, POC: POSITIVE — AB

## 2016-05-19 MED ORDER — OSELTAMIVIR PHOSPHATE 75 MG PO CAPS: 75.0000 mg | ORAL_CAPSULE | Freq: Two times a day (BID) | ORAL | 0 refills | Status: DC

## 2016-05-19 MED ORDER — ALBUTEROL SULFATE HFA 108 (90 BASE) MCG/ACT IN AERS: 2.0000 | INHALATION_SPRAY | RESPIRATORY_TRACT | 1 refills | Status: DC | PRN

## 2016-05-19 MED ORDER — ALBUTEROL SULFATE (2.5 MG/3ML) 0.083% IN NEBU
2.5000 mg | INHALATION_SOLUTION | Freq: Once | RESPIRATORY_TRACT | Status: AC
  Administered 2016-05-19: 2.5 mg via RESPIRATORY_TRACT

## 2016-05-19 MED ORDER — IPRATROPIUM BROMIDE 0.02 % IN SOLN
0.5000 mg | Freq: Once | RESPIRATORY_TRACT | Status: AC
  Administered 2016-05-19: 0.5 mg via RESPIRATORY_TRACT

## 2016-05-19 MED ORDER — ACETAMINOPHEN 500 MG PO TABS
1000.0000 mg | ORAL_TABLET | Freq: Once | ORAL | Status: AC
  Administered 2016-05-19: 1000 mg via ORAL

## 2016-05-19 NOTE — Patient Instructions (Addendum)
Use your inhaler every four hours for the next 24 hours.  Take tamilflu, first dose today as soon as you get to the pharmacy and then another dose tonight before bed.    Come back tomorrow if you have breathing difficulties.     IF you received an x-ray today, you will receive an invoice from [facility]. Please contact [facility] at [phone] with questions or concerns regarding your invoice.   IF you received labwork today, you will receive an invoice from [facility] [place] [facility]. Please contact [facility] at [phone] with questions or concerns regarding your invoice.   Our billing staff will not be able to assist you with questions regarding bills from these companies.  You will be contacted with the lab results as soon as they are available. The fastest way to get your results is to activate your My Chart account. Instructions are located on the last page of this paperwork. If you have not heard from [place] regarding the results in 2 weeks, please contact this office.

## 2016-05-19 NOTE — Progress Notes (Signed)
05/19/2016 5:21 PM   DOB: Mar 12, 1970 / MRN: [id number]  SUBJECTIVE:  [name] is a 46 y.o. female presenting for for chills whish started yesterday.  Says this started with sinus and allergies. Feels that she is getting worse.  SHe has a history of asthma and takes pro-air as needed.   No none exposures and is an Agricultural consultant for daycare. She has tried OTC preps with some relief.   She is allergic to amoxicillin; sulfa antibiotics; penicillins; and tape.   She  has a past medical history of Abdominal pain; Allergy; Allergy-induced asthma; Angina (05/2011); Ankle fracture; Chronic back pain greater than 3 months duration (01/17/2011); DDD (degenerative disc disease), cervical; Diverticulitis; GERD (gastroesophageal reflux disease); H/O hiatal hernia; Hyperlipidemia; Nausea; Neuromuscular disorder ([place]); Pre-diabetes; Rash; and Wrist fracture.    She  reports that she has never smoked. She has never used smokeless tobacco. She reports that she drinks alcohol. She reports that she does not use drugs. She  reports that she currently engages in sexual activity. She reports using the following method of birth control/protection: Surgical. The patient  has a past surgical history that includes Colonoscopy (07/2010); Wisdom tooth extraction (1990's); Ventral hernia repair (06/12/2011); Cholecystectomy (08/2010); Tubal ligation (12/1995); Breast cyst excision (Left, ~ 1983); Hernia repair (1997; 05/2011); Fracture surgery (06/12/11); and Dilatation & curettage/hysteroscopy with myosure (N/A, 09/03/2015).  Her family history includes Cancer in her father; Diabetes in her brother, mother, and sister; Heart disease in her father and maternal grandmother; Hyperlipidemia in her brother, father, and sister; Other in her father.  Review of Systems  Constitutional: Positive for chills, diaphoresis and fever.  Respiratory: Positive for cough and sputum production. Negative for hemoptysis and wheezing.    Cardiovascular: Negative for chest pain.  Skin: Negative for itching and rash.  Neurological: Negative for dizziness.    The problem list and medications were reviewed and updated by myself where necessary and exist elsewhere in the encounter.   OBJECTIVE:  BP 127/88 (BP Location: Right Arm, Patient Position: Sitting, Cuff Size: Large)   Pulse (!) 109   Temp (!) 101.5 F (38.6 C) (Oral)   Resp 16   Ht 5' 8.75" (1.746 m)   Wt 234 lb 6.4 oz (106.3 kg)   LMP 11/05/2015   SpO2 95%   BMI 34.87 kg/m   Physical Exam  Constitutional: She is oriented to person, place, and time. She appears well-developed and well-nourished.  Cardiovascular: Regular rhythm, normal heart sounds and intact distal pulses.  Exam reveals no gallop and no friction rub.   No murmur heard. Pulmonary/Chest: Effort normal and breath sounds normal.  Musculoskeletal: Normal range of motion.  Neurological: She is alert and oriented to person, place, and time.  Skin: Skin is warm and dry.    Results for orders placed or performed in visit on 05/19/16 (from the past 72 hour(s))  POCT Influenza A/B     Status: Abnormal   Collection Time: 05/19/16  5:02 PM  Result Value Ref Range   Influenza A, POC Negative Negative   Influenza B, POC Positive (A) Negative    No results found.  ASSESSMENT AND PLAN:  [name] was seen today for cough.  Diagnoses and all orders for this visit:  Fever in other diseases -     acetaminophen (TYLENOL) tablet 1,000 mg; Take 2 tablets (1,000 mg total) by mouth once. -     POCT Influenza A/B -     albuterol (PROVENTIL) (2.5 MG/3ML) 0.083% nebulizer solution 2.5  mg; Take 3 mLs (2.5 mg total) by nebulization once. -     ipratropium (ATROVENT) nebulizer solution 0.5 mg; Take 2.5 mLs (0.5 mg total) by nebulization once.  Influenza B -     oseltamivir (TAMIFLU) 75 MG capsule; Take 1 capsule (75 mg total) by mouth 2 (two) times daily.    The patient is advised to call or return to  clinic if she does not see an improvement in symptoms, or to seek the care of the closest emergency department if she worsens with the above plan.   Philis Fendt, MHS, [name] [facility] 05/19/2016 5:21 PM

## 2016-05-20 ENCOUNTER — Other Ambulatory Visit: Payer: Self-pay

## 2016-05-20 MED ORDER — ALBUTEROL SULFATE HFA 108 (90 BASE) MCG/ACT IN AERS: INHALATION_SPRAY | RESPIRATORY_TRACT | 1 refills | Status: DC

## 2017-02-23 DIAGNOSIS — G8929 Other chronic pain: Secondary | ICD-10-CM | POA: Insufficient documentation

## 2017-02-23 DIAGNOSIS — M545 Low back pain, unspecified: Secondary | ICD-10-CM | POA: Insufficient documentation

## 2017-05-24 ENCOUNTER — Other Ambulatory Visit: Payer: Self-pay | Admitting: Gastroenterology

## 2017-05-24 ENCOUNTER — Ambulatory Visit
Admission: RE | Admit: 2017-05-24 | Discharge: 2017-05-24 | Disposition: A | Discharge status: Home / Self Care | Payer: BC Managed Care – PPO | Source: Ambulatory Visit | Attending: Gastroenterology | Admitting: Gastroenterology

## 2017-05-24 DIAGNOSIS — R1032 Left lower quadrant pain: Secondary | ICD-10-CM

## 2017-05-24 MED ORDER — IOPAMIDOL (ISOVUE-300) INJECTION 61%
125.0000 mL | Freq: Once | INTRAVENOUS | Status: AC | PRN
  Administered 2017-05-24: 125 mL via INTRAVENOUS

## 2017-10-19 ENCOUNTER — Other Ambulatory Visit: Payer: Self-pay | Admitting: Obstetrics and Gynecology

## 2017-10-19 DIAGNOSIS — N6320 Unspecified lump in the left breast, unspecified quadrant: Secondary | ICD-10-CM

## 2017-10-25 ENCOUNTER — Ambulatory Visit
Admission: RE | Admit: 2017-10-25 | Discharge: 2017-10-25 | Disposition: A | Discharge status: Home / Self Care | Payer: BC Managed Care – PPO | Source: Ambulatory Visit | Attending: Obstetrics and Gynecology | Admitting: Obstetrics and Gynecology

## 2017-10-25 ENCOUNTER — Other Ambulatory Visit: Payer: BC Managed Care – PPO

## 2017-10-25 ENCOUNTER — Ambulatory Visit: Payer: BC Managed Care – PPO

## 2017-10-25 DIAGNOSIS — N6320 Unspecified lump in the left breast, unspecified quadrant: Secondary | ICD-10-CM

## 2017-11-01 DIAGNOSIS — R51 Headache: Secondary | ICD-10-CM | POA: Diagnosis not present

## 2017-11-01 DIAGNOSIS — J309 Allergic rhinitis, unspecified: Secondary | ICD-10-CM | POA: Diagnosis not present

## 2017-11-01 DIAGNOSIS — R5383 Other fatigue: Secondary | ICD-10-CM | POA: Diagnosis not present

## 2017-11-01 DIAGNOSIS — K5900 Constipation, unspecified: Secondary | ICD-10-CM | POA: Diagnosis not present

## 2017-11-01 DIAGNOSIS — I10 Essential (primary) hypertension: Secondary | ICD-10-CM | POA: Diagnosis not present

## 2017-12-06 ENCOUNTER — Encounter (INDEPENDENT_AMBULATORY_CARE_PROVIDER_SITE_OTHER): Payer: Self-pay

## 2017-12-13 ENCOUNTER — Encounter (INDEPENDENT_AMBULATORY_CARE_PROVIDER_SITE_OTHER): Payer: BC Managed Care – PPO

## 2017-12-17 ENCOUNTER — Ambulatory Visit (INDEPENDENT_AMBULATORY_CARE_PROVIDER_SITE_OTHER): Payer: BC Managed Care – PPO | Admitting: Family Medicine

## 2017-12-17 ENCOUNTER — Encounter (INDEPENDENT_AMBULATORY_CARE_PROVIDER_SITE_OTHER): Payer: Self-pay | Admitting: Family Medicine

## 2017-12-17 VITALS — BP 112/77 | HR 72 | Ht 69.0 in | Wt 234.0 lb

## 2017-12-17 DIAGNOSIS — R5383 Other fatigue: Secondary | ICD-10-CM

## 2017-12-17 DIAGNOSIS — E639 Nutritional deficiency, unspecified: Secondary | ICD-10-CM

## 2017-12-17 DIAGNOSIS — R739 Hyperglycemia, unspecified: Secondary | ICD-10-CM | POA: Diagnosis not present

## 2017-12-17 DIAGNOSIS — E559 Vitamin D deficiency, unspecified: Secondary | ICD-10-CM

## 2017-12-17 DIAGNOSIS — E669 Obesity, unspecified: Secondary | ICD-10-CM

## 2017-12-17 DIAGNOSIS — Z1331 Encounter for screening for depression: Secondary | ICD-10-CM | POA: Diagnosis not present

## 2017-12-17 DIAGNOSIS — Z6834 Body mass index (BMI) 34.0-34.9, adult: Secondary | ICD-10-CM

## 2017-12-17 DIAGNOSIS — Z9189 Other specified personal risk factors, not elsewhere classified: Secondary | ICD-10-CM | POA: Diagnosis not present

## 2017-12-17 DIAGNOSIS — E785 Hyperlipidemia, unspecified: Secondary | ICD-10-CM

## 2017-12-17 DIAGNOSIS — Z0289 Encounter for other administrative examinations: Secondary | ICD-10-CM

## 2017-12-17 DIAGNOSIS — R0602 Shortness of breath: Secondary | ICD-10-CM

## 2017-12-18 ENCOUNTER — Encounter (INDEPENDENT_AMBULATORY_CARE_PROVIDER_SITE_OTHER): Payer: Self-pay | Admitting: Family Medicine

## 2017-12-18 LAB — CBC WITH DIFFERENTIAL/PLATELET
BASOS ABS: 0 10*3/uL (ref 0.0–0.2)
Basos: 1 %
EOS (ABSOLUTE): 0 10*3/uL (ref 0.0–0.4)
EOS: 1 %
HEMATOCRIT: 43.7 % (ref 34.0–46.6)
Hemoglobin: 14.1 g/dL (ref 11.1–15.9)
IMMATURE GRANULOCYTES: 0 %
Immature Grans (Abs): 0 10*3/uL (ref 0.0–0.1)
LYMPHS ABS: 1.7 10*3/uL (ref 0.7–3.1)
Lymphs: 47 %
MCH: 26.4 pg — ABNORMAL LOW (ref 26.6–33.0)
MCHC: 32.3 g/dL (ref 31.5–35.7)
MCV: 82 fL (ref 79–97)
MONOCYTES: 7 %
MONOS ABS: 0.3 10*3/uL (ref 0.1–0.9)
NEUTROS PCT: 44 %
Neutrophils Absolute: 1.6 10*3/uL (ref 1.4–7.0)
Platelets: 227 10*3/uL (ref 150–450)
RBC: 5.34 x10E6/uL — ABNORMAL HIGH (ref 3.77–5.28)
RDW: 12.9 % (ref 12.3–15.4)
WBC: 3.6 10*3/uL (ref 3.4–10.8)

## 2017-12-18 LAB — HEMOGLOBIN A1C
Est. average glucose Bld gHb Est-mCnc: 117 mg/dL
HEMOGLOBIN A1C: 5.7 % — AB (ref 4.8–5.6)

## 2017-12-18 LAB — LIPID PANEL
CHOL/HDL RATIO: 4.3 ratio (ref 0.0–4.4)
CHOLESTEROL TOTAL: 217 mg/dL — AB (ref 100–199)
HDL: 50 mg/dL (ref >39)
LDL CALC: 155 mg/dL — AB (ref 0–99)
TRIGLYCERIDES: 61 mg/dL (ref 0–149)
VLDL CHOLESTEROL CAL: 12 mg/dL (ref 5–40)

## 2017-12-18 LAB — INSULIN, RANDOM: INSULIN: 7.8 u[IU]/mL (ref 2.6–24.9)

## 2017-12-18 LAB — COMPREHENSIVE METABOLIC PANEL
ALK PHOS: 58 IU/L (ref 39–117)
ALT: 17 IU/L (ref 0–32)
AST: 20 IU/L (ref 0–40)
Albumin/Globulin Ratio: 1.9 (ref 1.2–2.2)
Albumin: 4.7 g/dL (ref 3.5–5.5)
BUN/Creatinine Ratio: 11 (ref 9–23)
BUN: 10 mg/dL (ref 6–24)
Bilirubin Total: 0.4 mg/dL (ref 0.0–1.2)
CALCIUM: 9.6 mg/dL (ref 8.7–10.2)
CO2: 25 mmol/L (ref 20–29)
CREATININE: 0.87 mg/dL (ref 0.57–1.00)
Chloride: 100 mmol/L (ref 96–106)
GFR calc Af Amer: 91 mL/min/{1.73_m2} (ref >59)
GFR, EST NON AFRICAN AMERICAN: 79 mL/min/{1.73_m2} (ref >59)
GLOBULIN, TOTAL: 2.5 g/dL (ref 1.5–4.5)
GLUCOSE: 84 mg/dL (ref 65–99)
Potassium: 4.4 mmol/L (ref 3.5–5.2)
Sodium: 139 mmol/L (ref 134–144)
Total Protein: 7.2 g/dL (ref 6.0–8.5)

## 2017-12-18 LAB — T3: T3 TOTAL: 72 ng/dL (ref 71–180)

## 2017-12-18 LAB — T4, FREE: FREE T4: 1.25 ng/dL (ref 0.82–1.77)

## 2017-12-18 LAB — VITAMIN B12: Vitamin B-12: 451 pg/mL (ref 232–1245)

## 2017-12-18 LAB — VITAMIN D 25 HYDROXY (VIT D DEFICIENCY, FRACTURES): Vit D, 25-Hydroxy: 26.6 ng/mL — ABNORMAL LOW (ref 30.0–100.0)

## 2017-12-18 LAB — FOLATE: Folate: 9.3 ng/mL (ref >3.0)

## 2017-12-18 LAB — TSH: TSH: 1.24 u[IU]/mL (ref 0.450–4.500)

## 2017-12-18 NOTE — Progress Notes (Signed)
Office: [phone]  /  Fax: [phone]   Dear Dr. Garwin Brothers,   Thank you for referring [name] to our clinic. The following note includes my evaluation and treatment recommendations.  HPI:   Chief Complaint: OBESITY    [name] has been referred by Sheronette A. Garwin Brothers, MD for consultation regarding her obesity and obesity related comorbidities.    [name] (MR# [id number]) is a 47 y.o. female who presents on 12/18/2017 for obesity evaluation and treatment. Current BMI is Body mass index is 34.56 kg/m.Marland Kitchen [name] has been struggling with her weight for many years and has been unsuccessful in either losing weight, maintaining weight loss, or reaching her healthy weight goal. [name] has been losing weight on the 56 day diet, but her metabolism has dropped very low in response to limiting her calories so low.     [name] attended our information session and states she is currently in the action stage of change and ready to dedicate time achieving and maintaining a healthier weight. [name] is interested in becoming our patient and working on intensive lifestyle modifications including (but not limited to) diet, exercise and weight loss.    [name] states her desired weight loss is 55 to 60 lbs she started gaining weight in 2007 her heaviest weight ever was 285 lbs. she has significant food cravings issues  she snacks frequently in the evenings she skips meals frequently she is frequently drinking liquids with calories she has problems with excessive hunger  she frequently eats larger portions than normal  she struggles with emotional eating    [name] feels her energy is lower than it should be. This has worsened with weight gain and has not worsened recently. [name] denies daytime somnolence and admits to waking up still tired. Patient is at risk for obstructive sleep apnea. Patent has a history of symptoms of morning Tanya and morning headache. Patient  generally gets 6 or 7 hours of sleep per night, and states they generally have restless sleep. Snoring is present. Apneic episodes are present. Epworth Sleepiness Score is 1  Dyspnea on exertion [name] notes increasing shortness of breath with exercising and seems to be worsening over time with weight gain. She notes getting out of breath sooner with activity than she used to. This has not gotten worse recently. [name] denies orthopnea.  Hyperglycemia [name] has a history of some elevated blood glucose readings and a positive family history of diabetes.  She admits to polyphagia.  At risk for diabetes [name] is at higher than average risk for developing diabetes due to her obesity and hyperglycemia. She currently denies polyuria or polydipsia.  Hyperlipidemia [name] has hyperlipidemia and she is not on statin. She is attempting to control her cholesterol levels with intensive lifestyle modification including a low saturated fat diet, exercise and weight loss. She denies any chest pain.  Vitamin D deficiency [name] has a diagnosis of vitamin D deficiency. [name] is not currently taking vit D and  She admits Tanya, but she denies nausea, vomiting or muscle weakness.  Nutritional Deficiencies [name] has reduced her calories very low and she likely has nutritional deficiencies as a result. [name] admits to Tanya.  Depression Screen [name]'s Food and Mood (modified PHQ-9) score was  Depression screen PHQ 2/9 12/17/2017  Decreased Interest 1  Down, Depressed, Hopeless 0  PHQ - 2 Score 1  Altered sleeping 0  Tired, decreased energy 1  Change in appetite 1  Feeling bad or failure about yourself  0  Trouble concentrating 0  Moving slowly or fidgety/restless 0  Suicidal thoughts 0  PHQ-9 Score 3  Difficult doing work/chores Not difficult at all    ALLERGIES: Allergies  Allergen Reactions  . Amoxicillin Hives  . Sulfa Antibiotics Hives  . Penicillins Hives and Itching    Has  patient had a PCN reaction causing immediate rash, facial/tongue/throat swelling, SOB or lightheadedness with hypotension: Yes Has patient had a PCN reaction causing severe rash involving mucus membranes or skin necrosis: No Has patient had a PCN reaction that required hospitalization No Has patient had a PCN reaction occurring within the last 10 years: Yes If all of the above answers are "NO", then may proceed with Cephalosporin use.   . Tape     PLASTIC TAPE   (WHELPS, TORE SKIN)  . Tramadol Hives    MEDICATIONS: Current Outpatient Medications on File Prior to Visit  Medication Sig Dispense Refill  . acetaminophen-codeine (TYLENOL #3) 300-30 MG per tablet Take 1 tablet by mouth every 4 (four) hours as needed for moderate pain.     Marland Kitchen albuterol (PROVENTIL HFA;VENTOLIN HFA) 108 (90 Base) MCG/ACT inhaler 2 puffs q 4 hours as neededFor shortness of breath  Ok to sub with proair (covered rx) 18 g 1  . linaclotide (LINZESS) 145 MCG CAPS capsule Take 145 mcg by mouth daily before breakfast.    . Probiotic Product (PROBIOTIC PO) Take 1 capsule by mouth daily.     No current facility-administered medications on file prior to visit.     PAST MEDICAL HISTORY: Past Medical History:  Diagnosis Date  . Abdominal pain   . Allergy   . Allergy-induced asthma   . Angina 05/2011   NO HEART PROBLEM WENT TO ED 05/22/11 FOR CP STRESS TEST WAS NEGATIVE   . Ankle fracture   . Chronic back pain greater than 3 months duration 01/17/2011   injured lumbar and posterior cervical neck in fall   . Constipation   . DDD (degenerative disc disease), cervical   . Diverticulitis   . Gallbladder problem   . GERD (gastroesophageal reflux disease)   . H/O hiatal hernia   . Hyperlipidemia   . IBS (irritable bowel syndrome)   . Nausea   . Neuromuscular disorder ([place])    LUMBA BACK  TROUBLE   . Pre-diabetes   . Rash    UNDER BIL BREASTS  . Wrist fracture     PAST SURGICAL HISTORY: Past Surgical History:    Procedure Laterality Date  . BREAST CYST EXCISION Left ~ 1983  . CHOLECYSTECTOMY  08/2010  . COLONOSCOPY  07/2010   Removal of 3 polyps  . DILATATION & CURETTAGE/HYSTEROSCOPY WITH MYOSURE N/A 09/03/2015   Procedure: DILATATION & CURETTAGE/HYSTEROSCOPY WITH MYOSURE;  Surgeon: Servando Salina, MD;  Location: [facility];  Service: Gynecology;  Laterality: N/A;  . FRACTURE SURGERY  06/12/11   patient denies  . HERNIA REPAIR  9024; 0/9735   umbilical corrected during tubal ligation; VHR w/mesh  . TUBAL LIGATION  12/1995  . VENTRAL HERNIA REPAIR  06/12/2011   Procedure: LAPAROSCOPIC VENTRAL HERNIA;  Surgeon: Gwenyth Ober, MD;  Location: [facility];  Service: General;  Laterality: N/A;  . WISDOM TOOTH EXTRACTION  1990's    SOCIAL HISTORY: Social History   Tobacco Use  . Smoking status: Never Smoker  . Smokeless tobacco: Never Used  Substance Use Topics  . Alcohol use: Yes  . Drug use: No    FAMILY HISTORY: Family History  Problem  Relation Nicholson of Onset  . Diabetes Mother   . Hyperlipidemia Mother   . Cancer Mother   . Sleep apnea Mother   . Obesity Mother   . Heart disease Father   . Cancer Father        kidney and prostate  . Hyperlipidemia Father   . Other Father        gout and clogged arteries  . Hypertension Father   . Kidney disease Father   . Alcoholism Father   . Hyperlipidemia Sister   . Diabetes Sister   . Diabetes Brother   . Hyperlipidemia Brother   . Heart disease Maternal Grandmother     ROS: Review of Systems  Constitutional: Positive for malaise/Tanya.  Eyes:       + Wear Glasses or Contacts + Floaters  Respiratory: Positive for shortness of breath (on exertion).   Cardiovascular: Negative for chest pain and orthopnea.       + Leg Cramping  Gastrointestinal: Positive for constipation and heartburn. Negative for nausea and vomiting.  Genitourinary: Negative for frequency.  Musculoskeletal: Positive for back pain.       + Neck Pain Negative for  muscle weakness  Skin: Positive for rash.  Neurological: Positive for headaches.  Endo/Heme/Allergies: Negative for polydipsia. Bruises/bleeds easily (bruising).       Positive for hyperglycemia Positive for polyphagia    PHYSICAL EXAM: Blood pressure 112/77, pulse 72, height 5\' 9"  (1.753 m), weight 234 lb (106.1 kg), last menstrual period 11/05/2015, SpO2 97 %. Body mass index is 34.56 kg/m. Physical Exam  Constitutional: She is oriented to person, place, and time. She appears well-developed and well-nourished.  HENT:  Head: Normocephalic and atraumatic.  Nose: Nose normal.  Eyes: EOM are normal. No scleral icterus.  Neck: Normal range of motion. Neck supple. No thyromegaly present.  Cardiovascular: Normal rate and regular rhythm.  Pulmonary/Chest: Effort normal. No respiratory distress.  Abdominal: Soft. There is no tenderness.  + Obesity  Musculoskeletal: Normal range of motion.  Range of Motion normal in all 4 extremities  Neurological: She is alert and oriented to person, place, and time. Coordination normal.  Skin: Skin is warm and dry.  Psychiatric: She has a normal mood and affect.  Vitals reviewed.   RECENT LABS AND TESTS: BMET    Component Value Date/Time   NA 139 12/17/2017 1035   K 4.4 12/17/2017 1035   CL 100 12/17/2017 1035   CO2 25 12/17/2017 1035   GLUCOSE 84 12/17/2017 1035   GLUCOSE 81 08/30/2015 1530   BUN 10 12/17/2017 1035   CREATININE 0.87 12/17/2017 1035   CALCIUM 9.6 12/17/2017 1035   GFRNONAA 79 12/17/2017 1035   GFRAA 91 12/17/2017 1035   Lab Results  Component Value Date   HGBA1C 5.7 (H) 12/17/2017   Lab Results  Component Value Date   INSULIN 7.8 12/17/2017   CBC    Component Value Date/Time   WBC 3.6 12/17/2017 1035   WBC 5.6 08/30/2015 1530   RBC 5.34 (H) 12/17/2017 1035   RBC 5.02 08/30/2015 1530   HGB 14.1 12/17/2017 1035   HCT 43.7 12/17/2017 1035   PLT 227 12/17/2017 1035   MCV 82 12/17/2017 1035   MCH 26.4 (L)  12/17/2017 1035   MCH 27.1 08/30/2015 1530   MCHC 32.3 12/17/2017 1035   MCHC 32.9 08/30/2015 1530   RDW 12.9 12/17/2017 1035   LYMPHSABS 1.7 12/17/2017 1035   MONOABS 0.5 04/02/2013 0422   EOSABS 0.0 12/17/2017 1035  BASOSABS 0.0 12/17/2017 1035   Iron/TIBC/Ferritin/ %Sat No results found for: IRON, TIBC, FERRITIN, IRONPCTSAT Lipid Panel     Component Value Date/Time   CHOL 217 (H) 12/17/2017 1035   TRIG 61 12/17/2017 1035   HDL 50 12/17/2017 1035   CHOLHDL 4.3 12/17/2017 1035   LDLCALC 155 (H) 12/17/2017 1035   Hepatic Function Panel     Component Value Date/Time   PROT 7.2 12/17/2017 1035   ALBUMIN 4.7 12/17/2017 1035   AST 20 12/17/2017 1035   ALT 17 12/17/2017 1035   ALKPHOS 58 12/17/2017 1035   BILITOT 0.4 12/17/2017 1035      Component Value Date/Time   TSH 1.240 12/17/2017 1035    ECG  shows NSR with a rate of 62 BPM INDIRECT CALORIMETER done today shows a VO2 of 197 and a REE of 1370.  Her calculated basal metabolic rate is 4315 thus her basal metabolic rate is worse than expected.    ASSESSMENT AND PLAN: Other Tanya - Plan: EKG 12-Lead, CBC with Differential/Platelet, Comprehensive metabolic panel, Vitamin Q00, Folate, T3, T4, free, TSH  Shortness of breath on exertion  Hyperglycemia - Plan: Hemoglobin A1c, Insulin, random  Hyperlipidemia, unspecified hyperlipidemia type - Plan: Lipid panel  Vitamin D deficiency - Plan: VITAMIN D 25 Hydroxy (Vit-D Deficiency, Fractures)  Nutritional deficiency  At risk for diabetes mellitus  Depression screening  Class 1 obesity with serious comorbidity and body mass index (BMI) of 34.0 to 34.9 in adult, unspecified obesity type  PLAN: Tanya [name] was informed that her Tanya may be related to obesity, depression or many other causes. Labs will be ordered, and in the meanwhile [name] has agreed to work on diet, exercise and weight loss to help with Tanya. Proper sleep hygiene was discussed including the  need for 7-8 hours of quality sleep each night. A sleep study was not ordered based on symptoms and Epworth score.  Dyspnea on exertion Suheyla's shortness of breath appears to be obesity related and exercise induced. She has agreed to work on weight loss and gradually increase exercise to treat her exercise induced shortness of breath. If Cipriana follows our instructions and loses weight without improvement of her shortness of breath, we will plan to refer to pulmonology. We will monitor this condition regularly. Lugene agrees to this plan.  Hyperglycemia Fasting labs will be obtained and results will be discussed with [name] in 2 weeks at her follow up visit. In the meanwhile [name] was started on a lower simple carbohydrate diet and will work on weight loss efforts.  Diabetes risk counseling [name] was given extended (15 minutes) diabetes prevention counseling today. She is 47 y.o. female and has risk factors for diabetes including obesity and hyperglycemia. We discussed intensive lifestyle modifications today with an emphasis on weight loss as well as increasing exercise and decreasing simple carbohydrates in her diet.  Hyperlipidemia [name] was informed of the American Heart Association Guidelines emphasizing intensive lifestyle modifications as the first line treatment for hyperlipidemia. We discussed many lifestyle modifications today in depth, and [name] will start to work on diet and decreasing saturated fats such as fatty red meat, butter and many fried foods. She will also increase vegetables and lean protein in her diet and start to work on exercise and weight loss efforts.  Vitamin D Deficiency [name] was informed that low vitamin D levels contributes to Tanya and are associated with obesity, breast, and colon cancer. We will check labs and she will follow up for routine testing  of vitamin D, at least 2-3 times per year. She was informed of the risk of over-replacement of vitamin D and  agrees to not increase her dose unless she discusses this with [place] first. Yacine will follow up with our clinic in 2 weeks.  Nutritional Deficiencies We will check labs and results will be discussed with [place] in 2 weeks at her follow up visit.  Depression Screen Shaniah had a negative depression screening. Depression is commonly associated with obesity and often results in emotional eating behaviors. We will monitor this closely and work on CBT to help improve the non-hunger eating patterns. Referral to Psychology may be required if no improvement is seen as she continues in our clinic.  Obesity Kobe is currently in the action stage of change and her goal is to continue with weight loss efforts. I recommend Maiko begin the structured treatment plan as follows:  She has agreed to follow the Category 2 plan Crystin has been instructed to eventually work up to a goal of 150 minutes of combined cardio and strengthening exercise per week for weight loss and overall health benefits. We discussed the following Behavioral Modification Strategies today: no skipping meals, increasing lean protein intake, decreasing simple carbohydrates  and work on meal planning and easy cooking plans   She was informed of the importance of frequent follow up visits to maximize her success with intensive lifestyle modifications for her multiple health conditions. She was informed we would discuss her lab results at her next visit unless there is a critical issue that needs to be addressed sooner. Alliya agreed to keep her next visit at the agreed upon time to discuss these results.    OBESITY BEHAVIORAL INTERVENTION VISIT  Today's visit was # 1   Starting weight: 234 lbs Starting date: 12/17/17 Today's weight : 234 lbs Today's date: 12/17/2017 Total lbs lost to date: 0   ASK: We discussed the diagnosis of obesity with Aleja B Fink today and Dosha agreed to give [place] permission to discuss obesity  behavioral modification therapy today.  ASSESS: Elodie has the diagnosis of obesity and her BMI today is 34.54 Clarissa is in the action stage of change   ADVISE: Dezire was educated on the multiple health risks of obesity as well as the benefit of weight loss to improve her health. She was advised of the need for long term treatment and the importance of lifestyle modifications to improve her current health and to decrease her risk of future health problems.  AGREE: Multiple dietary modification options and treatment options were discussed and  Ishika agreed to follow the recommendations documented in the above note.  ARRANGE: Layana was educated on the importance of frequent visits to treat obesity as outlined per CMS and USPSTF guidelines and agreed to schedule her next follow up appointment today.  I, Doreene Nest, am acting as transcriptionist for Dennard Nip, MD  I have reviewed the above documentation for accuracy and completeness, and I agree with the above. -Dennard Nip, MD

## 2018-01-01 ENCOUNTER — Ambulatory Visit (INDEPENDENT_AMBULATORY_CARE_PROVIDER_SITE_OTHER): Payer: BC Managed Care – PPO | Admitting: Family Medicine

## 2018-01-01 VITALS — BP 135/89 | HR 75 | Temp 98.1°F | Ht 69.0 in | Wt 235.0 lb

## 2018-01-01 DIAGNOSIS — E559 Vitamin D deficiency, unspecified: Secondary | ICD-10-CM | POA: Diagnosis not present

## 2018-01-01 DIAGNOSIS — Z6834 Body mass index (BMI) 34.0-34.9, adult: Secondary | ICD-10-CM

## 2018-01-01 DIAGNOSIS — R7303 Prediabetes: Secondary | ICD-10-CM | POA: Diagnosis not present

## 2018-01-01 DIAGNOSIS — Z9189 Other specified personal risk factors, not elsewhere classified: Secondary | ICD-10-CM | POA: Diagnosis not present

## 2018-01-01 DIAGNOSIS — E669 Obesity, unspecified: Secondary | ICD-10-CM

## 2018-01-01 DIAGNOSIS — E7849 Other hyperlipidemia: Secondary | ICD-10-CM

## 2018-01-01 MED ORDER — VITAMIN D (ERGOCALCIFEROL) 1.25 MG (50000 UNIT) PO CAPS: 50000.0000 [IU] | ORAL_CAPSULE | ORAL | 0 refills | Status: DC

## 2018-01-02 NOTE — Progress Notes (Signed)
Office: [phone]  /  Fax: [phone]   HPI:   Chief Complaint: OBESITY [name] is here to discuss her progress with her obesity treatment plan. She is on the Category 2 plan and is following her eating plan approximately 50 % of the time. She states she is doing cardio and circuit training for 45 minutes 3 times per week. [name] did well with her Category 2 plan for the first few days but then went on vacation on a cruise and was unable to meal prepping and planning.  Her weight is 235 lb (106.6 kg) today and has gained 1 pound since her last visit. She has lost 0 lbs since starting treatment with [place].  Hyperlipidemia (Isolated) [name] has hyperlipidemia, her LDL is elevated at 155 and is attempting to improve her cholesterol levels with intensive lifestyle modification including a low saturated fat diet, exercise and weight loss. She denies any chest pain, claudication or myalgias.  Vitamin D Deficiency [name] has a diagnosis of vitamin D deficiency. She is not on Vit D and her level is low. She notes fatigue and denies nausea, vomiting or muscle weakness.  Pre-Diabetes [name] has a diagnosis of pre-diabetes based on her elevated Hgb A1c at 5.7 and was informed this puts her at greater risk of developing diabetes. She declines metformin and continues to work on diet and exercise to decrease risk of diabetes. She notes polyphagia and denies hypoglycemia.  At risk for diabetes [name] is at higher than average risk for developing diabetes due to her obesity and pre-diabetes. She currently denies polyuria or polydipsia.  ALLERGIES: Allergies  Allergen Reactions  . Amoxicillin Hives  . Sulfa Antibiotics Hives  . Penicillins Hives and Itching    Has patient had a PCN reaction causing immediate rash, facial/tongue/throat swelling, SOB or lightheadedness with hypotension: Yes Has patient had a PCN reaction causing severe rash involving mucus membranes or skin necrosis: No Has patient  had a PCN reaction that required hospitalization No Has patient had a PCN reaction occurring within the last 10 years: Yes If all of the above answers are "NO", then may proceed with Cephalosporin use.   . Tape     PLASTIC TAPE   (WHELPS, TORE SKIN)  . Tramadol Hives    MEDICATIONS: Current Outpatient Medications on File Prior to Visit  Medication Sig Dispense Refill  . acetaminophen-codeine (TYLENOL #3) 300-30 MG per tablet Take 1 tablet by mouth every 4 (four) hours as needed for moderate pain.     Marland Kitchen albuterol (PROVENTIL HFA;VENTOLIN HFA) 108 (90 Base) MCG/ACT inhaler 2 puffs q 4 hours as neededFor shortness of breath  Ok to sub with proair (covered rx) 18 g 1  . linaclotide (LINZESS) 145 MCG CAPS capsule Take 145 mcg by mouth daily before breakfast.    . Probiotic Product (PROBIOTIC PO) Take 1 capsule by mouth daily.     No current facility-administered medications on file prior to visit.     PAST MEDICAL HISTORY: Past Medical History:  Diagnosis Date  . Abdominal pain   . Allergy   . Allergy-induced asthma   . Angina 05/2011   NO HEART PROBLEM WENT TO ED 05/22/11 FOR CP STRESS TEST WAS NEGATIVE   . Ankle fracture   . Chronic back pain greater than 3 months duration 01/17/2011   injured lumbar and posterior cervical neck in fall   . Constipation   . DDD (degenerative disc disease), cervical   . Diverticulitis   . Gallbladder problem   .  GERD (gastroesophageal reflux disease)   . H/O hiatal hernia   . Hyperlipidemia   . IBS (irritable bowel syndrome)   . Nausea   . Neuromuscular disorder ([place])    LUMBA BACK  TROUBLE   . Pre-diabetes   . Rash    UNDER BIL BREASTS  . Wrist fracture     PAST SURGICAL HISTORY: Past Surgical History:  Procedure Laterality Date  . BREAST CYST EXCISION Left ~ 1983  . CHOLECYSTECTOMY  08/2010  . COLONOSCOPY  07/2010   Removal of 3 polyps  . DILATATION & CURETTAGE/HYSTEROSCOPY WITH MYOSURE N/A 09/03/2015   Procedure: DILATATION &  CURETTAGE/HYSTEROSCOPY WITH MYOSURE;  Surgeon: Servando Salina, MD;  Location: [facility];  Service: Gynecology;  Laterality: N/A;  . FRACTURE SURGERY  06/12/11   patient denies  . HERNIA REPAIR  5366; 05/4032   umbilical corrected during tubal ligation; VHR w/mesh  . TUBAL LIGATION  12/1995  . VENTRAL HERNIA REPAIR  06/12/2011   Procedure: LAPAROSCOPIC VENTRAL HERNIA;  Surgeon: Gwenyth Ober, MD;  Location: [place] [facility];  Service: General;  Laterality: N/A;  . WISDOM TOOTH EXTRACTION  1990's    SOCIAL HISTORY: Social History   Tobacco Use  . Smoking status: Never Smoker  . Smokeless tobacco: Never Used  Substance Use Topics  . Alcohol use: Yes  . Drug use: No    FAMILY HISTORY: Family History  Problem Relation Age of Onset  . Diabetes Mother   . Hyperlipidemia Mother   . Cancer Mother   . Sleep apnea Mother   . Obesity Mother   . Heart disease Father   . Cancer Father        kidney and prostate  . Hyperlipidemia Father   . Other Father        gout and clogged arteries  . Hypertension Father   . Kidney disease Father   . Alcoholism Father   . Hyperlipidemia Sister   . Diabetes Sister   . Diabetes Brother   . Hyperlipidemia Brother   . Heart disease Maternal Grandmother     ROS: Review of Systems  Constitutional: Positive for malaise/fatigue. Negative for weight loss.  Cardiovascular: Negative for chest pain and claudication.  Gastrointestinal: Negative for nausea and vomiting.  Genitourinary: Negative for frequency.  Musculoskeletal: Negative for myalgias.       Negative muscle weakness  Endo/Heme/Allergies: Negative for polydipsia.       Positive polyphagia Negative hypoglycemia    PHYSICAL EXAM: Blood pressure 135/89, pulse 75, temperature 98.1 F (36.7 C), temperature source Oral, height 5\' 9"  (1.753 m), weight 235 lb (106.6 kg), last menstrual period 11/05/2015, SpO2 93 %. Body mass index is 34.7 kg/m. Physical Exam  Constitutional: She is oriented to  person, place, and time. She appears well-developed and well-nourished.  Cardiovascular: Normal rate.  Pulmonary/Chest: Effort normal.  Musculoskeletal: Normal range of motion.  Neurological: She is oriented to person, place, and time.  Skin: Skin is warm and dry.  Psychiatric: She has a normal mood and affect. Her behavior is normal.  Vitals reviewed.   RECENT LABS AND TESTS: BMET    Component Value Date/Time   NA 139 12/17/2017 1035   K 4.4 12/17/2017 1035   CL 100 12/17/2017 1035   CO2 25 12/17/2017 1035   GLUCOSE 84 12/17/2017 1035   GLUCOSE 81 08/30/2015 1530   BUN 10 12/17/2017 1035   CREATININE 0.87 12/17/2017 1035   CALCIUM 9.6 12/17/2017 1035   GFRNONAA 79 12/17/2017 1035  GFRAA 91 12/17/2017 1035   Lab Results  Component Value Date   HGBA1C 5.7 (H) 12/17/2017   Lab Results  Component Value Date   INSULIN 7.8 12/17/2017   CBC    Component Value Date/Time   WBC 3.6 12/17/2017 1035   WBC 5.6 08/30/2015 1530   RBC 5.34 (H) 12/17/2017 1035   RBC 5.02 08/30/2015 1530   HGB 14.1 12/17/2017 1035   HCT 43.7 12/17/2017 1035   PLT 227 12/17/2017 1035   MCV 82 12/17/2017 1035   MCH 26.4 (L) 12/17/2017 1035   MCH 27.1 08/30/2015 1530   MCHC 32.3 12/17/2017 1035   MCHC 32.9 08/30/2015 1530   RDW 12.9 12/17/2017 1035   LYMPHSABS 1.7 12/17/2017 1035   MONOABS 0.5 04/02/2013 0422   EOSABS 0.0 12/17/2017 1035   BASOSABS 0.0 12/17/2017 1035   Iron/TIBC/Ferritin/ %Sat No results found for: IRON, TIBC, FERRITIN, IRONPCTSAT Lipid Panel     Component Value Date/Time   CHOL 217 (H) 12/17/2017 1035   TRIG 61 12/17/2017 1035   HDL 50 12/17/2017 1035   CHOLHDL 4.3 12/17/2017 1035   LDLCALC 155 (H) 12/17/2017 1035   Hepatic Function Panel     Component Value Date/Time   PROT 7.2 12/17/2017 1035   ALBUMIN 4.7 12/17/2017 1035   AST 20 12/17/2017 1035   ALT 17 12/17/2017 1035   ALKPHOS 58 12/17/2017 1035   BILITOT 0.4 12/17/2017 1035      Component Value  Date/Time   TSH 1.240 12/17/2017 1035  Results for SIMONNE, BOULOS (MRN [id number]) as of 01/02/2018 14:00  Ref. Range 12/17/2017 10:35  Vitamin D, 25-Hydroxy Latest Ref Range: 30.0 - 100.0 ng/mL 26.6 (L)    ASSESSMENT AND PLAN: Other hyperlipidemia  Vitamin D deficiency - Plan: Vitamin D, Ergocalciferol, (DRISDOL) 1.25 MG (50000 UT) CAPS capsule  Prediabetes  At risk for diabetes mellitus  Class 1 obesity with serious comorbidity and body mass index (BMI) of 34.0 to 34.9 in adult, unspecified obesity type  PLAN:  Hyperlipidemia (Isolated) [name] was informed of the American Heart Association Guidelines emphasizing intensive lifestyle modifications as the first line treatment for hyperlipidemia. We discussed many lifestyle modifications today in depth, and [name] will continue to work on decreasing saturated fats such as fatty red meat, butter and many fried foods. She will also increase vegetables and lean protein in her diet and continue to work on diet, exercise, and weight loss efforts. We will recheck labs in 3 months. [name] agrees to follow up with our clinic in 2 weeks.  Vitamin D Deficiency [name] was informed that low vitamin D levels contributes to fatigue and are associated with obesity, breast, and colon cancer. [name] agrees to start prescription Vit D @50 ,000 IU every week #4 with no refills. She will follow up for routine testing of vitamin D, at least 2-3 times per year. She was informed of the risk of over-replacement of vitamin D and agrees to not increase her dose unless she discusses this with [place] first. We will recheck labs in 3 months. [name] agrees to follow up with our clinic in 2 weeks.  Pre-Diabetes [name] will continue to work on weight loss, diet, exercise, and decreasing simple carbohydrates in her diet to help decrease the risk of diabetes. We dicussed metformin including benefits and risks. She was informed that eating too many simple carbohydrates or too  many calories at one sitting increases the likelihood of GI side effects. [name] declined metformin for now and a prescription was  not written today. [name] agrees to follow up with our clinic in 2 weeks as directed to monitor her progress.  Diabetes risk counselling [name] was given extended (30 minutes) diabetes prevention counseling today. She is 47 y.o. female and has risk factors for diabetes including obesity and pre-diabetes. We discussed intensive lifestyle modifications today with an emphasis on weight loss as well as increasing exercise and decreasing simple carbohydrates in her diet.  Obesity [name] is currently in the action stage of change. As such, her goal is to continue with weight loss efforts She has agreed to follow the Category 2 plan [name] has been instructed to work up to a goal of 150 minutes of combined cardio and strengthening exercise per week for weight loss and overall health benefits. We discussed the following Behavioral Modification Strategies today: increasing lean protein intake, decreasing simple carbohydrates  and work on meal planning and easy cooking plans   [name] has agreed to follow up with our clinic in 2 weeks. She was informed of the importance of frequent follow up visits to maximize her success with intensive lifestyle modifications for her multiple health conditions.   OBESITY BEHAVIORAL INTERVENTION VISIT  Today's visit was # 2   Starting weight: 234 lbs Starting date: 12/17/17 Today's weight : 235 lbs Today's date: 01/01/2018 Total lbs lost to date: 0    ASK: We discussed the diagnosis of obesity with [name] today and [name] agreed to give [place] permission to discuss obesity behavioral modification therapy today.  ASSESS: [name] has the diagnosis of obesity and her BMI today is 34.69 [name] is in the action stage of change   ADVISE: [name] was educated on the multiple health risks of obesity as well as the benefit of weight  loss to improve her health. She was advised of the need for long term treatment and the importance of lifestyle modifications to improve her current health and to decrease her risk of future health problems.  AGREE: Multiple dietary modification options and treatment options were discussed and  [name] agreed to follow the recommendations documented in the above note.  ARRANGE: [name] was educated on the importance of frequent visits to treat obesity as outlined per CMS and USPSTF guidelines and agreed to schedule her next follow up appointment today.  I, Trixie Dredge, am acting as transcriptionist for Dennard Nip, MD  I have reviewed the above documentation for accuracy and completeness, and I agree with the above. -Dennard Nip, MD

## 2018-01-03 ENCOUNTER — Encounter (INDEPENDENT_AMBULATORY_CARE_PROVIDER_SITE_OTHER): Payer: Self-pay | Admitting: Family Medicine

## 2018-01-14 ENCOUNTER — Ambulatory Visit (INDEPENDENT_AMBULATORY_CARE_PROVIDER_SITE_OTHER): Payer: BC Managed Care – PPO | Admitting: Family Medicine

## 2018-01-14 ENCOUNTER — Encounter (INDEPENDENT_AMBULATORY_CARE_PROVIDER_SITE_OTHER): Payer: Self-pay | Admitting: Family Medicine

## 2018-01-14 VITALS — BP 131/82 | HR 82 | Temp 98.2°F | Ht 69.0 in | Wt 232.0 lb

## 2018-01-14 DIAGNOSIS — R7303 Prediabetes: Secondary | ICD-10-CM

## 2018-01-14 DIAGNOSIS — E559 Vitamin D deficiency, unspecified: Secondary | ICD-10-CM | POA: Diagnosis not present

## 2018-01-14 DIAGNOSIS — E669 Obesity, unspecified: Secondary | ICD-10-CM

## 2018-01-14 DIAGNOSIS — Z6834 Body mass index (BMI) 34.0-34.9, adult: Secondary | ICD-10-CM

## 2018-01-21 ENCOUNTER — Encounter (INDEPENDENT_AMBULATORY_CARE_PROVIDER_SITE_OTHER): Payer: Self-pay | Admitting: Family Medicine

## 2018-01-21 NOTE — Progress Notes (Signed)
Office: [phone]  /  Fax: [phone]   HPI:   Chief Complaint: OBESITY [name] is here to discuss her progress with her obesity treatment plan. She is on the Category 2 plan and is following her eating plan approximately 70 % of the time. She states she is exercising 0 minutes 0 times per week. [name] is bored with lunch options. She is eating all her prescribed food. She has been eating out some for lunch.  Her weight is 232 lb (105.2 kg) today and has had a weight loss of 3 pounds over a period of 2 weeks since her last visit. She has lost 2 lbs since starting treatment with [place].  Pre-Diabetes [name] has a diagnosis of pre-diabetes based on her elevated Hgb A1c and was informed this puts her at greater risk of developing diabetes. Her last A1c was 5.7 on 12/17/17. She is not taking metformin currently and continues to work on diet and exercise to decrease risk of diabetes. She denies polyphagia or hypoglycemia.  Vitamin D deficiency [name] has a diagnosis of vitamin D deficiency. She is currently taking vit D and is stable, but not at goal. Her last vitamin D level was 26.6 on 12/17/17. She denies nausea, vomiting, or muscle weakness.  ALLERGIES: Allergies  Allergen Reactions  . Amoxicillin Hives  . Sulfa Antibiotics Hives  . Penicillins Hives and Itching    Has patient had a PCN reaction causing immediate rash, facial/tongue/throat swelling, SOB or lightheadedness with hypotension: Yes Has patient had a PCN reaction causing severe rash involving mucus membranes or skin necrosis: No Has patient had a PCN reaction that required hospitalization No Has patient had a PCN reaction occurring within the last 10 years: Yes If all of the above answers are "NO", then may proceed with Cephalosporin use.   . Tape     PLASTIC TAPE   (WHELPS, TORE SKIN)  . Tramadol Hives    MEDICATIONS: Current Outpatient Medications on File Prior to Visit  Medication Sig Dispense Refill  .  acetaminophen-codeine (TYLENOL #3) 300-30 MG per tablet Take 1 tablet by mouth every 4 (four) hours as needed for moderate pain.     Marland Kitchen albuterol (PROVENTIL HFA;VENTOLIN HFA) 108 (90 Base) MCG/ACT inhaler 2 puffs q 4 hours as neededFor shortness of breath  Ok to sub with proair (covered rx) 18 g 1  . linaclotide (LINZESS) 145 MCG CAPS capsule Take 145 mcg by mouth daily before breakfast.    . Probiotic Product (PROBIOTIC PO) Take 1 capsule by mouth daily.    . Vitamin D, Ergocalciferol, (DRISDOL) 1.25 MG (50000 UT) CAPS capsule Take 1 capsule (50,000 Units total) by mouth every 7 (seven) days. 4 capsule 0   No current facility-administered medications on file prior to visit.     PAST MEDICAL HISTORY: Past Medical History:  Diagnosis Date  . Abdominal pain   . Allergy   . Allergy-induced asthma   . Angina 05/2011   NO HEART PROBLEM WENT TO ED 05/22/11 FOR CP STRESS TEST WAS NEGATIVE   . Ankle fracture   . Chronic back pain greater than 3 months duration 01/17/2011   injured lumbar and posterior cervical neck in fall   . Constipation   . DDD (degenerative disc disease), cervical   . Diverticulitis   . Gallbladder problem   . GERD (gastroesophageal reflux disease)   . H/O hiatal hernia   . Hyperlipidemia   . IBS (irritable bowel syndrome)   . Nausea   . Neuromuscular  disorder ([place])    [place]   . Pre-diabetes   . Rash    UNDER BIL BREASTS  . Wrist fracture     PAST SURGICAL HISTORY: Past Surgical History:  Procedure Laterality Date  . BREAST CYST EXCISION Left ~ 1983  . CHOLECYSTECTOMY  08/2010  . COLONOSCOPY  07/2010   Removal of 3 polyps  . DILATATION & CURETTAGE/HYSTEROSCOPY WITH MYOSURE N/A 09/03/2015   Procedure: DILATATION & CURETTAGE/HYSTEROSCOPY WITH MYOSURE;  Surgeon: Servando Salina, MD;  Location: [facility];  Service: Gynecology;  Laterality: N/A;  . FRACTURE SURGERY  06/12/11   patient denies  . HERNIA REPAIR  5643; 04/2949   umbilical corrected during  tubal ligation; VHR w/mesh  . TUBAL LIGATION  12/1995  . VENTRAL HERNIA REPAIR  06/12/2011   Procedure: LAPAROSCOPIC VENTRAL HERNIA;  Surgeon: Gwenyth Ober, MD;  Location: [place] [facility];  Service: General;  Laterality: N/A;  . WISDOM TOOTH EXTRACTION  1990's    SOCIAL HISTORY: Social History   Tobacco Use  . Smoking status: Never Smoker  . Smokeless tobacco: Never Used  Substance Use Topics  . Alcohol use: Yes  . Drug use: No    FAMILY HISTORY: Family History  Problem Relation Age of Onset  . Diabetes Mother   . Hyperlipidemia Mother   . Cancer Mother   . Sleep apnea Mother   . Obesity Mother   . Heart disease Father   . Cancer Father        kidney and prostate  . Hyperlipidemia Father   . Other Father        gout and clogged arteries  . Hypertension Father   . Kidney disease Father   . Alcoholism Father   . Hyperlipidemia Sister   . Diabetes Sister   . Diabetes Brother   . Hyperlipidemia Brother   . Heart disease Maternal Grandmother     ROS: Review of Systems  Constitutional: Positive for weight loss.  Gastrointestinal: Negative for nausea and vomiting.  Musculoskeletal:       Negative for muscle weakness.  Endo/Heme/Allergies:       Negative for polyphagia. Negative for hypoglycemia.    PHYSICAL EXAM: Blood pressure 131/82, pulse 82, temperature 98.2 F (36.8 C), temperature source Oral, height 5\' 9"  (1.753 m), weight 232 lb (105.2 kg), last menstrual period 11/05/2015, SpO2 98 %. Body mass index is 34.26 kg/m. Physical Exam  Constitutional: She is oriented to person, place, and time. She appears well-developed and well-nourished.  Cardiovascular: Normal rate.  Pulmonary/Chest: Effort normal.  Musculoskeletal: Normal range of motion.  Neurological: She is oriented to person, place, and time.  Skin: Skin is warm and dry.  Psychiatric: She has a normal mood and affect. Her behavior is normal.  Vitals reviewed.   RECENT LABS AND TESTS: BMET    Component  Value Date/Time   NA 139 12/17/2017 1035   K 4.4 12/17/2017 1035   CL 100 12/17/2017 1035   CO2 25 12/17/2017 1035   GLUCOSE 84 12/17/2017 1035   GLUCOSE 81 08/30/2015 1530   BUN 10 12/17/2017 1035   CREATININE 0.87 12/17/2017 1035   CALCIUM 9.6 12/17/2017 1035   GFRNONAA 79 12/17/2017 1035   GFRAA 91 12/17/2017 1035   Lab Results  Component Value Date   HGBA1C 5.7 (H) 12/17/2017   Lab Results  Component Value Date   INSULIN 7.8 12/17/2017   CBC    Component Value Date/Time   WBC 3.6 12/17/2017 1035  WBC 5.6 08/30/2015 1530   RBC 5.34 (H) 12/17/2017 1035   RBC 5.02 08/30/2015 1530   HGB 14.1 12/17/2017 1035   HCT 43.7 12/17/2017 1035   PLT 227 12/17/2017 1035   MCV 82 12/17/2017 1035   MCH 26.4 (L) 12/17/2017 1035   MCH 27.1 08/30/2015 1530   MCHC 32.3 12/17/2017 1035   MCHC 32.9 08/30/2015 1530   RDW 12.9 12/17/2017 1035   LYMPHSABS 1.7 12/17/2017 1035   MONOABS 0.5 04/02/2013 0422   EOSABS 0.0 12/17/2017 1035   BASOSABS 0.0 12/17/2017 1035   Iron/TIBC/Ferritin/ %Sat No results found for: IRON, TIBC, FERRITIN, IRONPCTSAT Lipid Panel     Component Value Date/Time   CHOL 217 (H) 12/17/2017 1035   TRIG 61 12/17/2017 1035   HDL 50 12/17/2017 1035   CHOLHDL 4.3 12/17/2017 1035   LDLCALC 155 (H) 12/17/2017 1035   Hepatic Function Panel     Component Value Date/Time   PROT 7.2 12/17/2017 1035   ALBUMIN 4.7 12/17/2017 1035   AST 20 12/17/2017 1035   ALT 17 12/17/2017 1035   ALKPHOS 58 12/17/2017 1035   BILITOT 0.4 12/17/2017 1035      Component Value Date/Time   TSH 1.240 12/17/2017 1035   Results for ARIEONA, SWAGGERTY (MRN [id number]) as of 01/21/2018 07:41  Ref. Range 12/17/2017 10:35  Vitamin D, 25-Hydroxy Latest Ref Range: 30.0 - 100.0 ng/mL 26.6 (L)   ASSESSMENT AND PLAN: Prediabetes  Vitamin D deficiency  Class 1 obesity with serious comorbidity and body mass index (BMI) of 34.0 to 34.9 in adult, unspecified obesity  type  PLAN:  Pre-Diabetes [name] will continue to work on weight loss, exercise, and decreasing simple carbohydrates in her diet to help decrease the risk of diabetes.  She wishes to defer metformin for now.  [name] agreed to continue with her diet and to follow up with [place] as directed to monitor her progress in 2 weeks.  Vitamin D Deficiency [name] was informed that low vitamin D levels contributes to fatigue and are associated with obesity, breast, and colon cancer. She agrees to continue to take prescription Vit D @50 ,000 IU every week and will follow up for routine testing of vitamin D, at least 2-3 times per year. She was informed of the risk of over-replacement of vitamin D and agrees to not increase her dose unless she discusses this with [place] first. [name] agrees with this plan.  I spent > than 50% of the 15 minute visit on counseling as documented in the note.  Obesity [name] is currently in the action stage of change. As such, her goal is to continue with weight loss efforts. She has agreed to follow the Category 2 plan + breakfast options and keep a food journal with 300 to 400 calories and 30+ grams of protein for lunch. She was given the "Journaling", "Protein Content", and "On the Road" information handouts. Akaiya has been instructed to work up to a goal of 150 minutes of combined cardio and strengthening exercise per week for weight loss and overall health benefits. We discussed the following Behavioral Modification Strategies today: holiday eating strategies, keep a strict food journal, and ways to avoid boredom eating.  [name] has agreed to follow up with our clinic in 2 weeks. She was informed of the importance of frequent follow up visits to maximize her success with intensive lifestyle modifications for her multiple health conditions.   OBESITY BEHAVIORAL INTERVENTION VISIT  Today's visit was # 3   Starting weight:  234 lbs Starting date: 12/17/17 Today's weight : Weight:  232 lb (105.2 kg)  Today's date: 01/14/2018 Total lbs lost to date: 2  ASK: We discussed the diagnosis of obesity with [name] today and [name] agreed to give [place] permission to discuss obesity behavioral modification therapy today.  ASSESS: [name] has the diagnosis of obesity and her BMI today is 34.24. [name] is in the action stage of change.   ADVISE: [name] was educated on the multiple health risks of obesity as well as the benefit of weight loss to improve her health. She was advised of the need for long term treatment and the importance of lifestyle modifications to improve her current health and to decrease her risk of future health problems.  AGREE: Multiple dietary modification options and treatment options were discussed and [name] agreed to follow the recommendations documented in the above note.  ARRANGE: [name] was educated on the importance of frequent visits to treat obesity as outlined per CMS and USPSTF guidelines and agreed to schedule her next follow up appointment today.  Lenward Chancellor, am acting as Location manager for Georgianne Fick, FNP.  I have reviewed the above documentation for accuracy and completeness, and I agree with the above.  -  , FNP-C.

## 2018-01-31 ENCOUNTER — Encounter (INDEPENDENT_AMBULATORY_CARE_PROVIDER_SITE_OTHER): Payer: Self-pay | Admitting: Family Medicine

## 2018-01-31 ENCOUNTER — Ambulatory Visit (INDEPENDENT_AMBULATORY_CARE_PROVIDER_SITE_OTHER): Payer: BC Managed Care – PPO | Admitting: Family Medicine

## 2018-01-31 VITALS — BP 135/88 | HR 70 | Temp 97.6°F | Ht 69.0 in | Wt 231.0 lb

## 2018-01-31 DIAGNOSIS — Z6834 Body mass index (BMI) 34.0-34.9, adult: Secondary | ICD-10-CM

## 2018-01-31 DIAGNOSIS — E559 Vitamin D deficiency, unspecified: Secondary | ICD-10-CM

## 2018-01-31 DIAGNOSIS — K219 Gastro-esophageal reflux disease without esophagitis: Secondary | ICD-10-CM | POA: Diagnosis not present

## 2018-01-31 DIAGNOSIS — Z9189 Other specified personal risk factors, not elsewhere classified: Secondary | ICD-10-CM | POA: Diagnosis not present

## 2018-01-31 DIAGNOSIS — E669 Obesity, unspecified: Secondary | ICD-10-CM | POA: Diagnosis not present

## 2018-01-31 MED ORDER — VITAMIN D (ERGOCALCIFEROL) 1.25 MG (50000 UNIT) PO CAPS: 50000.0000 [IU] | ORAL_CAPSULE | ORAL | 0 refills | Status: DC

## 2018-01-31 NOTE — Progress Notes (Signed)
Office: [phone]  /  Fax: [phone]   HPI:   Chief Complaint: OBESITY [name] is here to discuss her progress with her obesity treatment plan. [name] is on the Category 2 plan with breakfast options and is following her eating plan approximately 30 % of the time. [name] states [name] is exercising 0 minutes 0 times per week. [name] misunderstood journaling at lunch and has been journaling more than 1 meal without proper parameters for breakfast and dinner.   Her weight is 231 lb (104.8 kg) today and has had a weight loss of 1 pound over a period of 2 weeks since her last visit. [name] has lost 3 lbs since starting treatment with [place].  Vitamin D deficiency [name] has a diagnosis of vitamin D deficiency. [name] is currently taking vit D and is not at goal. Her last vitamin D level was 26.6 on 12/17/17. [name] denies nausea, vomiting, or muscle weakness.  At risk for osteopenia and osteoporosis [name] is at higher risk of osteopenia and osteoporosis due to vitamin D deficiency.   Gastroesophageal Reflux Disease [name] is occasionally having reflux when [name] eats poorly. [name] usually uses ranitidine which provides relief but takes some time.  ALLERGIES: Allergies  Allergen Reactions  . Amoxicillin Hives  . Sulfa Antibiotics Hives  . Penicillins Hives and Itching    Has patient had a PCN reaction causing immediate rash, facial/tongue/throat swelling, SOB or lightheadedness with hypotension: Yes Has patient had a PCN reaction causing severe rash involving mucus membranes or skin necrosis: No Has patient had a PCN reaction that required hospitalization No Has patient had a PCN reaction occurring within the last 10 years: Yes If all of the above answers are "NO", then may proceed with Cephalosporin use.   . Tape     PLASTIC TAPE   (WHELPS, TORE SKIN)  . Tramadol Hives    MEDICATIONS: Current Outpatient Medications on File Prior to Visit  Medication Sig Dispense Refill  . acetaminophen-codeine  (TYLENOL #3) 300-30 MG per tablet Take 1 tablet by mouth every 4 (four) hours as needed for moderate pain.     Marland Kitchen albuterol (PROVENTIL HFA;VENTOLIN HFA) 108 (90 Base) MCG/ACT inhaler 2 puffs q 4 hours as neededFor shortness of breath  Ok to sub with proair (covered rx) 18 g 1  . linaclotide (LINZESS) 145 MCG CAPS capsule Take 145 mcg by mouth daily before breakfast.    . Probiotic Product (PROBIOTIC PO) Take 1 capsule by mouth daily.     No current facility-administered medications on file prior to visit.     PAST MEDICAL HISTORY: Past Medical History:  Diagnosis Date  . Abdominal pain   . Allergy   . Allergy-induced asthma   . Angina 05/2011   NO HEART PROBLEM WENT TO ED 05/22/11 FOR CP STRESS TEST WAS NEGATIVE   . Ankle fracture   . Chronic back pain greater than 3 months duration 01/17/2011   injured lumbar and posterior cervical neck in fall   . Constipation   . DDD (degenerative disc disease), cervical   . Diverticulitis   . Gallbladder problem   . GERD (gastroesophageal reflux disease)   . H/O hiatal hernia   . Hyperlipidemia   . IBS (irritable bowel syndrome)   . Nausea   . Neuromuscular disorder ([place])    LUMBA BACK  TROUBLE   . Pre-diabetes   . Rash    UNDER BIL BREASTS  . Wrist fracture     PAST SURGICAL HISTORY: Past Surgical History:  Procedure Laterality Date  . BREAST CYST EXCISION Left ~ 1983  . CHOLECYSTECTOMY  08/2010  . COLONOSCOPY  07/2010   Removal of 3 polyps  . DILATATION & CURETTAGE/HYSTEROSCOPY WITH MYOSURE N/A 09/03/2015   Procedure: DILATATION & CURETTAGE/HYSTEROSCOPY WITH MYOSURE;  Surgeon: Servando Salina, MD;  Location: [facility];  Service: Gynecology;  Laterality: N/A;  . FRACTURE SURGERY  06/12/11   patient denies  . HERNIA REPAIR  3299; 03/4266   umbilical corrected during tubal ligation; VHR w/mesh  . TUBAL LIGATION  12/1995  . VENTRAL HERNIA REPAIR  06/12/2011   Procedure: LAPAROSCOPIC VENTRAL HERNIA;  Surgeon: Gwenyth Ober, MD;   Location: [facility];  Service: General;  Laterality: N/A;  . WISDOM TOOTH EXTRACTION  1990's    SOCIAL HISTORY: Social History   Tobacco Use  . Smoking status: Never Smoker  . Smokeless tobacco: Never Used  Substance Use Topics  . Alcohol use: Yes  . Drug use: No    FAMILY HISTORY: Family History  Problem Relation Age of Onset  . Diabetes Mother   . Hyperlipidemia Mother   . Cancer Mother   . Sleep apnea Mother   . Obesity Mother   . Heart disease Father   . Cancer Father        kidney and prostate  . Hyperlipidemia Father   . Other Father        gout and clogged arteries  . Hypertension Father   . Kidney disease Father   . Alcoholism Father   . Hyperlipidemia Sister   . Diabetes Sister   . Diabetes Brother   . Hyperlipidemia Brother   . Heart disease Maternal Grandmother     ROS: Review of Systems  Constitutional: Positive for weight loss.  Gastrointestinal: Negative for nausea and vomiting.  Musculoskeletal:       Negative for muscle weakness.    PHYSICAL EXAM: Blood pressure 135/88, pulse 70, temperature 97.6 F (36.4 C), temperature source Oral, height 5\' 9"  (1.753 m), weight 231 lb (104.8 kg), last menstrual period 11/05/2015, SpO2 96 %. Body mass index is 34.11 kg/m. Physical Exam Vitals signs reviewed.  Constitutional:      Appearance: Normal appearance. [name] is obese.  Cardiovascular:     Rate and Rhythm: Normal rate.  Pulmonary:     Effort: Pulmonary effort is normal.  Musculoskeletal: Normal range of motion.  Skin:    General: Skin is warm and dry.  Neurological:     Mental Status: [name] is alert and oriented to person, place, and time.  Psychiatric:        Mood and Affect: Mood normal.        Behavior: Behavior normal.     RECENT LABS AND TESTS: BMET    Component Value Date/Time   NA 139 12/17/2017 1035   K 4.4 12/17/2017 1035   CL 100 12/17/2017 1035   CO2 25 12/17/2017 1035   GLUCOSE 84 12/17/2017 1035   GLUCOSE 81 08/30/2015 1530     BUN 10 12/17/2017 1035   CREATININE 0.87 12/17/2017 1035   CALCIUM 9.6 12/17/2017 1035   GFRNONAA 79 12/17/2017 1035   GFRAA 91 12/17/2017 1035   Lab Results  Component Value Date   HGBA1C 5.7 (H) 12/17/2017   Lab Results  Component Value Date   INSULIN 7.8 12/17/2017   CBC    Component Value Date/Time   WBC 3.6 12/17/2017 1035   WBC 5.6 08/30/2015 1530   RBC 5.34 (H) 12/17/2017 1035   RBC  5.02 08/30/2015 1530   HGB 14.1 12/17/2017 1035   HCT 43.7 12/17/2017 1035   PLT 227 12/17/2017 1035   MCV 82 12/17/2017 1035   MCH 26.4 (L) 12/17/2017 1035   MCH 27.1 08/30/2015 1530   MCHC 32.3 12/17/2017 1035   MCHC 32.9 08/30/2015 1530   RDW 12.9 12/17/2017 1035   LYMPHSABS 1.7 12/17/2017 1035   MONOABS 0.5 04/02/2013 0422   EOSABS 0.0 12/17/2017 1035   BASOSABS 0.0 12/17/2017 1035   Iron/TIBC/Ferritin/ %Sat No results found for: IRON, TIBC, FERRITIN, IRONPCTSAT Lipid Panel     Component Value Date/Time   CHOL 217 (H) 12/17/2017 1035   TRIG 61 12/17/2017 1035   HDL 50 12/17/2017 1035   CHOLHDL 4.3 12/17/2017 1035   LDLCALC 155 (H) 12/17/2017 1035   Hepatic Function Panel     Component Value Date/Time   PROT 7.2 12/17/2017 1035   ALBUMIN 4.7 12/17/2017 1035   AST 20 12/17/2017 1035   ALT 17 12/17/2017 1035   ALKPHOS 58 12/17/2017 1035   BILITOT 0.4 12/17/2017 1035      Component Value Date/Time   TSH 1.240 12/17/2017 1035   Results for NIRALI, MAGOUIRK (MRN [id number]) as of 01/31/2018 10:31  Ref. Range 12/17/2017 10:35  Vitamin D, 25-Hydroxy Latest Ref Range: 30.0 - 100.0 ng/mL 26.6 (L)   ASSESSMENT AND PLAN: Vitamin D deficiency - Plan: Vitamin D, Ergocalciferol, (DRISDOL) 1.25 MG (50000 UT) CAPS capsule, DISCONTINUED: Vitamin D, Ergocalciferol, (DRISDOL) 1.25 MG (50000 UT) CAPS capsule  Gastroesophageal reflux disease, esophagitis presence not specified  At risk for osteoporosis  Class 1 obesity with serious comorbidity and body mass index (BMI) of  34.0 to 34.9 in adult, unspecified obesity type  PLAN:  Vitamin D Deficiency [name] was informed that low vitamin D levels contributes to fatigue and are associated with obesity, breast, and colon cancer. [name] agrees to continue to take prescription Vit D @50 ,000 IU every week #4 with no refills and will follow up for routine testing of vitamin D, at least 2-3 times per year. [name] was informed of the risk of over-replacement of vitamin D and agrees to not increase her dose unless [name] discusses this with [place] first. [name] agrees to follow up in 3 weeks.  At risk for osteopenia and osteoporosis [name] was given extended (15 minutes) osteoporosis prevention counseling today. [name] is at risk for osteopenia and osteoporosis due to her vitamin D deficiency. [name] was encouraged to take her vitamin D and follow her higher calcium diet and increase strengthening exercise to help strengthen her bones and decrease her risk of osteopenia and osteoporosis.  Gastroesophageal Reflux Disease [name] agrees to take ranitidine and/or Mylanta as needed and will follow up as directed.  Obesity [name] is currently in the action stage of change. As such, her goal is to continue with weight loss efforts. [name] has agreed follow Category 2 with breakfast options and to keep a food journal with 300 to 400 calories and 30+ grams of protein for lunch. I explained journaling at lunch only and not at other meals. [name] has not been prescribed exercise at this time. We discussed the following Behavioral Modification Strategies today: increasing lean protein intake, celebration eating strategies, and to keep a strict food journal.   [name] has agreed to follow up with our clinic in 3 weeks. [name] was informed of the importance of frequent follow up visits to maximize her success with intensive lifestyle modifications for her multiple health conditions.   OBESITY BEHAVIORAL  INTERVENTION VISIT  Today's visit was # 4   Starting  weight: 234 lbs Starting date: 12/17/17 Today's weight : Weight: 231 lb (104.8 kg)  Today's date: 01/31/2018 Total lbs lost to date: 3  ASK: We discussed the diagnosis of obesity with [name] today and [name] agreed to give [place] permission to discuss obesity behavioral modification therapy today.  ASSESS: [name] has the diagnosis of obesity and her BMI today is 34.1. [name] is in the action stage of change.   ADVISE: [name] was educated on the multiple health risks of obesity as well as the benefit of weight loss to improve her health. [name] was advised of the need for long term treatment and the importance of lifestyle modifications to improve her current health and to decrease her risk of future health problems.  AGREE: Multiple dietary modification options and treatment options were discussed and [name] agreed to follow the recommendations documented in the above note.  ARRANGE: [name] was educated on the importance of frequent visits to treat obesity as outlined per CMS and USPSTF guidelines and agreed to schedule her next follow up appointment today.  I, [name], am acting as Location manager for [employer], FNP-C.  I have reviewed the above documentation for accuracy and completeness, and I agree with the above.  - Tanya Whitmire, FNP-C.

## 2018-02-07 ENCOUNTER — Encounter: Payer: Self-pay | Admitting: Nurse Practitioner

## 2018-02-07 ENCOUNTER — Ambulatory Visit: Payer: BC Managed Care – PPO | Admitting: Nurse Practitioner

## 2018-02-07 VITALS — BP 130/86 | HR 82 | Temp 97.6°F | Ht 65.6 in | Wt 236.4 lb

## 2018-02-07 DIAGNOSIS — H6690 Otitis media, unspecified, unspecified ear: Secondary | ICD-10-CM

## 2018-02-07 DIAGNOSIS — J3089 Other allergic rhinitis: Secondary | ICD-10-CM

## 2018-02-07 DIAGNOSIS — H6123 Impacted cerumen, bilateral: Secondary | ICD-10-CM | POA: Diagnosis not present

## 2018-02-07 DIAGNOSIS — J4520 Mild intermittent asthma, uncomplicated: Secondary | ICD-10-CM

## 2018-02-07 MED ORDER — DOXYCYCLINE HYCLATE 100 MG PO TABS: 100.0000 mg | ORAL_TABLET | Freq: Two times a day (BID) | ORAL | 0 refills | Status: DC

## 2018-02-07 MED ORDER — MONTELUKAST SODIUM 10 MG PO TABS: 10.0000 mg | ORAL_TABLET | Freq: Every day | ORAL | 1 refills | Status: DC

## 2018-02-07 MED ORDER — MOMETASONE FUROATE 50 MCG/ACT NA SUSP
2.0000 | Freq: Every day | NASAL | 2 refills | Status: DC
Start: 2018-02-07 — End: 2018-12-09

## 2018-02-07 NOTE — Progress Notes (Signed)
Subjective:     Patient ID: [name] , female    DOB: February 12, 1971 , 47 y.o.   MRN: [id number]   Chief Complaint  Patient presents with  . URI    patient states she has been having some sinus pressure and pressure in her ears that started on monday. patient denies having a fever    HPI  URI   This is a chronic problem. The current episode started in the past 7 days. The problem has been gradually worsening. There has been no fever. Associated symptoms include coughing, ear pain (left ear), a plugged ear sensation and a sore throat. Pertinent negatives include no chest pain, congestion, headaches or wheezing. She has tried decongestant for the symptoms. The treatment provided mild relief.     Past Medical History:  Diagnosis Date  . Abdominal pain   . Allergy   . Allergy-induced asthma   . Angina 05/2011   NO HEART PROBLEM WENT TO ED 05/22/11 FOR CP STRESS TEST WAS NEGATIVE   . Ankle fracture   . Chronic back pain greater than 3 months duration 01/17/2011   injured lumbar and posterior cervical neck in fall   . Constipation   . DDD (degenerative disc disease), cervical   . Diverticulitis   . Gallbladder problem   . GERD (gastroesophageal reflux disease)   . H/O hiatal hernia   . Hyperlipidemia   . IBS (irritable bowel syndrome)   . Nausea   . Neuromuscular disorder ([place])    LUMBA BACK  TROUBLE   . Pre-diabetes   . Rash    UNDER BIL BREASTS  . Wrist fracture      Family History  Problem Relation Nicholson of Onset  . Diabetes Mother   . Hyperlipidemia Mother   . Cancer Mother   . Sleep apnea Mother   . Obesity Mother   . Heart disease Father   . Cancer Father        kidney and prostate  . Hyperlipidemia Father   . Other Father        gout and clogged arteries  . Hypertension Father   . Kidney disease Father   . Alcoholism Father   . Hyperlipidemia Sister   . Diabetes Sister   . Diabetes Brother   . Hyperlipidemia Brother   . Heart disease Maternal  Grandmother      Current Outpatient Medications:  .  acetaminophen-codeine (TYLENOL #3) 300-30 MG per tablet, Take 1 tablet by mouth every 4 (four) hours as needed for moderate pain. , Disp: , Rfl:  .  linaclotide (LINZESS) 145 MCG CAPS capsule, Take 145 mcg by mouth daily before breakfast., Disp: , Rfl:  .  Probiotic Product (PROBIOTIC PO), Take 1 capsule by mouth daily., Disp: , Rfl:  .  Vitamin D, Ergocalciferol, (DRISDOL) 1.25 MG (50000 UT) CAPS capsule, Take 1 capsule (50,000 Units total) by mouth every 7 (seven) days., Disp: 4 capsule, Rfl: 0 .  albuterol (PROVENTIL HFA;VENTOLIN HFA) 108 (90 Base) MCG/ACT inhaler, 2 puffs q 4 hours as neededFor shortness of breath  Ok to sub with proair (covered rx) (Patient not taking: Reported on 02/07/2018), Disp: 18 g, Rfl: 1   Allergies  Allergen Reactions  . Amoxicillin Hives  . Sulfa Antibiotics Hives  . Penicillins Hives and Itching    Has patient had a PCN reaction causing immediate rash, facial/tongue/throat swelling, SOB or lightheadedness with hypotension: Yes Has patient had a PCN reaction causing severe rash involving mucus membranes or  skin necrosis: No Has patient had a PCN reaction that required hospitalization No Has patient had a PCN reaction occurring within the last 10 years: Yes If all of the above answers are "NO", then may proceed with Cephalosporin use.   . Tape     PLASTIC TAPE   (WHELPS, TORE SKIN)  . Tramadol Hives     Review of Systems  Constitutional: Negative for fatigue and fever.  HENT: Positive for ear pain (left ear), sore throat and voice change. Negative for congestion, hearing loss, postnasal drip and sinus pressure.   Respiratory: Positive for cough. Negative for chest tightness, shortness of breath and wheezing.   Cardiovascular: Negative for chest pain, palpitations and leg swelling.  Allergic/Immunologic: Positive for environmental allergies. Negative for food allergies and immunocompromised state.   Neurological: Negative for dizziness, light-headedness and headaches.     Today's Vitals   02/07/18 1533  BP: 130/86  Pulse: 82  Temp: 97.6 F (36.4 C)  TempSrc: Oral  SpO2: 94%  Weight: 236 lb 6.4 oz (107.2 kg)  Height: 5' 5.6" (1.666 m)  PainSc: 4   PainLoc: Ear   Body mass index is 38.62 kg/m.   Objective:  Physical Exam Vitals signs reviewed.  Constitutional:      Appearance: Normal appearance.  HENT:     Head: Normocephalic.     Right Ear: External ear normal. There is impacted cerumen.     Left Ear: Ear canal and external ear normal. There is impacted cerumen.     Ears:     Comments: Right ear with erythema    Nose: Congestion present. No rhinorrhea.     Mouth/Throat:     Mouth: Mucous membranes are moist.     Pharynx: Posterior oropharyngeal erythema present. No oropharyngeal exudate.  Eyes:     Extraocular Movements: Extraocular movements intact.     Conjunctiva/sclera: Conjunctivae normal.     Pupils: Pupils are equal, round, and reactive to light.  Neck:     Musculoskeletal: Normal range of motion and neck supple. No muscular tenderness.  Cardiovascular:     Rate and Rhythm: Normal rate.     Pulses: Normal pulses.     Heart sounds: Normal heart sounds. No murmur.  Neurological:     Mental Status: She is alert.         Assessment And Plan:     1. Acute otitis media, unspecified otitis media type  Right ear erythematous   Will treat with doxycycline - doxycycline (VIBRA-TABS) 100 MG tablet; Take 1 tablet (100 mg total) by mouth 2 (two) times daily.  Dispense: 14 tablet; Refill: 0  2. Intermittent asthma without complication, unspecified asthma severity  No current exacerbation however has been without her medications and currently has cold symptoms - montelukast (SINGULAIR) 10 MG tablet; Take 1 tablet (10 mg total) by mouth daily.  Dispense: 90 tablet; Refill: 1 - mometasone (NASONEX) 50 MCG/ACT nasal spray; Place 2 sprays into the nose daily.   Dispense: 17 g; Refill: 2  3. Non-seasonal allergic rhinitis, unspecified trigger  Chronic, stable - montelukast (SINGULAIR) 10 MG tablet; Take 1 tablet (10 mg total) by mouth daily.  Dispense: 90 tablet; Refill: 1 - mometasone (NASONEX) 50 MCG/ACT nasal spray; Place 2 sprays into the nose daily.  Dispense: 17 g; Refill: 2  4. Bilateral impacted cerumen  Removed cerumen with lighted curette      Minette Brine, FNP

## 2018-02-07 NOTE — Patient Instructions (Signed)

## 2018-02-17 ENCOUNTER — Encounter: Payer: Self-pay | Admitting: Nurse Practitioner

## 2018-02-21 ENCOUNTER — Ambulatory Visit (INDEPENDENT_AMBULATORY_CARE_PROVIDER_SITE_OTHER): Payer: BC Managed Care – PPO | Admitting: Family Medicine

## 2018-02-21 ENCOUNTER — Encounter (INDEPENDENT_AMBULATORY_CARE_PROVIDER_SITE_OTHER): Payer: Self-pay | Admitting: Family Medicine

## 2018-02-21 VITALS — BP 128/83 | HR 85 | Temp 98.1°F | Ht 69.0 in | Wt 230.0 lb

## 2018-02-21 DIAGNOSIS — Z9189 Other specified personal risk factors, not elsewhere classified: Secondary | ICD-10-CM

## 2018-02-21 DIAGNOSIS — Z6833 Body mass index (BMI) 33.0-33.9, adult: Secondary | ICD-10-CM

## 2018-02-21 DIAGNOSIS — E559 Vitamin D deficiency, unspecified: Secondary | ICD-10-CM

## 2018-02-21 DIAGNOSIS — K219 Gastro-esophageal reflux disease without esophagitis: Secondary | ICD-10-CM

## 2018-02-21 DIAGNOSIS — E669 Obesity, unspecified: Secondary | ICD-10-CM | POA: Diagnosis not present

## 2018-02-21 MED ORDER — VITAMIN D (ERGOCALCIFEROL) 1.25 MG (50000 UNIT) PO CAPS: 50000.0000 [IU] | ORAL_CAPSULE | ORAL | 0 refills | Status: DC

## 2018-02-25 ENCOUNTER — Encounter (INDEPENDENT_AMBULATORY_CARE_PROVIDER_SITE_OTHER): Payer: Self-pay | Admitting: Family Medicine

## 2018-02-25 DIAGNOSIS — Z6833 Body mass index (BMI) 33.0-33.9, adult: Secondary | ICD-10-CM

## 2018-02-25 DIAGNOSIS — E6609 Other obesity due to excess calories: Secondary | ICD-10-CM | POA: Insufficient documentation

## 2018-02-25 DIAGNOSIS — E669 Obesity, unspecified: Secondary | ICD-10-CM | POA: Insufficient documentation

## 2018-02-25 DIAGNOSIS — E66812 Obesity, class 2: Secondary | ICD-10-CM | POA: Insufficient documentation

## 2018-02-25 DIAGNOSIS — E559 Vitamin D deficiency, unspecified: Secondary | ICD-10-CM | POA: Insufficient documentation

## 2018-02-25 DIAGNOSIS — K219 Gastro-esophageal reflux disease without esophagitis: Secondary | ICD-10-CM | POA: Insufficient documentation

## 2018-02-25 NOTE — Progress Notes (Signed)
Office: [phone]  /  Fax: [phone]   HPI:   Chief Complaint: OBESITY [name] is here to discuss her progress with her obesity treatment plan. She is on the Category 2 plan and is following her eating plan approximately 5 % of the time. She states she is exercising 0 minutes 0 times per week. [name] has been off track over the holidays. She is going to the grocery store today to buy food for the plan.  Her weight is 230 lb (104.3 kg) today and has had a weight loss of 1 pound over a period of 3 weeks since her last visit. She has lost 4 lbs since starting treatment with [place].  Vitamin D deficiency [name] has a diagnosis of vitamin D deficiency. She is currently taking vit D and is not at goal. Her last vitamin D level was 26.6 on 12/17/17. She denies nausea, vomiting, or muscle weakness.  Gastroesophageal Reflux Disease [name] is using Mylanta which relieves heartburn. She is worried that symptoms are heart related because she recently had a few friends diagnosed with heart problems who are young.   At risk for diabetes [name] is at higher than average risk for developing diabetes due to her vitamin D deficiency and obesity. She currently denies polyuria or polydipsia.  ASSESSMENT AND PLAN:  Vitamin D deficiency - Plan: Vitamin D, Ergocalciferol, (DRISDOL) 1.25 MG (50000 UT) CAPS capsule  Gastroesophageal reflux disease without esophagitis  At risk for diabetes mellitus  Class 1 obesity with serious comorbidity and body mass index (BMI) of 33.0 to 33.9 in adult, unspecified obesity type  PLAN:  Vitamin D Deficiency [name] was informed that low vitamin D levels contributes to fatigue and are associated with obesity, breast, and colon cancer. She agrees to continue to take prescription Vit D @50 ,000 IU every week#4 with no refills and will follow up for routine testing of vitamin D, at least 2-3 times per year. She was informed of the risk of over-replacement of vitamin D and  agrees to not increase her dose unless she discusses this with [place] first. [name] agrees to follow up in 2 weeks.  Gastroesophageal Reflux Disease [name] has agreed to pay attention to when the discomfort happens and if it is related to eating. She will see her PCP for continued discomfort. [name] agrees with this plan.  Diabetes risk counseling [name] was given extended (15 minutes) diabetes prevention counseling today. She is 48 y.o. female and has risk factors for diabetes including vitamin D deficiency and obesity. We discussed intensive lifestyle modifications today with an emphasis on weight loss as well as increasing exercise and decreasing simple carbohydrates in her diet.  Obesity [name] is currently in the action stage of change. As such, her goal is to continue with weight loss efforts. She has agreed to follow the Category 2 plan and to keep a food journal with 300 to 400 calories and 30+ grams of protein for lunch. We discussed exchanges for 2 ounces of protein. [name] has not been prescribed exercise at this time. We discussed the following Behavioral Modification Strategies today: increasing lean protein intake, work on meal planning and easy cooking plans, keeping healthy foods in the home and planning for success.  [name] has agreed to follow up with our clinic in 2 weeks. She was informed of the importance of frequent follow up visits to maximize her success with intensive lifestyle modifications for her multiple health conditions.  ALLERGIES: Allergies  Allergen Reactions  . Amoxicillin Hives  .  Sulfa Antibiotics Hives  . Penicillins Hives and Itching    Has patient had a PCN reaction causing immediate rash, facial/tongue/throat swelling, SOB or lightheadedness with hypotension: Yes Has patient had a PCN reaction causing severe rash involving mucus membranes or skin necrosis: No Has patient had a PCN reaction that required hospitalization No Has patient had a PCN reaction  occurring within the last 10 years: Yes If all of the above answers are "NO", then may proceed with Cephalosporin use.   . Tape     PLASTIC TAPE   (WHELPS, TORE SKIN)  . Tramadol Hives    MEDICATIONS: Current Outpatient Medications on File Prior to Visit  Medication Sig Dispense Refill  . acetaminophen-codeine (TYLENOL #3) 300-30 MG per tablet Take 1 tablet by mouth every 4 (four) hours as needed for moderate pain.     Marland Kitchen albuterol (PROVENTIL HFA;VENTOLIN HFA) 108 (90 Base) MCG/ACT inhaler 2 puffs q 4 hours as neededFor shortness of breath  Ok to sub with proair (covered rx) 18 g 1  . doxycycline (VIBRA-TABS) 100 MG tablet Take 1 tablet (100 mg total) by mouth 2 (two) times daily. 14 tablet 0  . linaclotide (LINZESS) 145 MCG CAPS capsule Take 145 mcg by mouth daily before breakfast.    . mometasone (NASONEX) 50 MCG/ACT nasal spray Place 2 sprays into the nose daily. 17 g 2  . montelukast (SINGULAIR) 10 MG tablet Take 1 tablet (10 mg total) by mouth daily. 90 tablet 1  . Probiotic Product (PROBIOTIC PO) Take 1 capsule by mouth daily.     No current facility-administered medications on file prior to visit.     PAST MEDICAL HISTORY: Past Medical History:  Diagnosis Date  . Abdominal pain   . Allergy   . Allergy-induced asthma   . Angina 05/2011   NO HEART PROBLEM WENT TO ED 05/22/11 FOR CP STRESS TEST WAS NEGATIVE   . Ankle fracture   . Chronic back pain greater than 3 months duration 01/17/2011   injured lumbar and posterior cervical neck in fall   . Constipation   . DDD (degenerative disc disease), cervical   . Diverticulitis   . Gallbladder problem   . GERD (gastroesophageal reflux disease)   . H/O hiatal hernia   . Hyperlipidemia   . IBS (irritable bowel syndrome)   . Nausea   . Neuromuscular disorder ([place])    LUMBA BACK  TROUBLE   . Pre-diabetes   . Rash    UNDER BIL BREASTS  . Wrist fracture     PAST SURGICAL HISTORY: Past Surgical History:  Procedure Laterality  Date  . BREAST CYST EXCISION Left ~ 1983  . CHOLECYSTECTOMY  08/2010  . COLONOSCOPY  07/2010   Removal of 3 polyps  . DILATATION & CURETTAGE/HYSTEROSCOPY WITH MYOSURE N/A 09/03/2015   Procedure: DILATATION & CURETTAGE/HYSTEROSCOPY WITH MYOSURE;  Surgeon: Servando Salina, MD;  Location: [facility];  Service: Gynecology;  Laterality: N/A;  . FRACTURE SURGERY  06/12/11   patient denies  . HERNIA REPAIR  7062; 04/7626   umbilical corrected during tubal ligation; VHR w/mesh  . TUBAL LIGATION  12/1995  . VENTRAL HERNIA REPAIR  06/12/2011   Procedure: LAPAROSCOPIC VENTRAL HERNIA;  Surgeon: Gwenyth Ober, MD;  Location: [facility];  Service: General;  Laterality: N/A;  . WISDOM TOOTH EXTRACTION  1990's    SOCIAL HISTORY: Social History   Tobacco Use  . Smoking status: Never Smoker  . Smokeless tobacco: Never Used  Substance Use Topics  .  Alcohol use: Yes  . Drug use: No    FAMILY HISTORY: Family History  Problem Relation Age of Onset  . Diabetes Mother   . Hyperlipidemia Mother   . Cancer Mother   . Sleep apnea Mother   . Obesity Mother   . Heart disease Father   . Cancer Father        kidney and prostate  . Hyperlipidemia Father   . Other Father        gout and clogged arteries  . Hypertension Father   . Kidney disease Father   . Alcoholism Father   . Hyperlipidemia Sister   . Diabetes Sister   . Diabetes Brother   . Hyperlipidemia Brother   . Heart disease Maternal Grandmother     ROS: Review of Systems  Constitutional: Positive for weight loss.  Gastrointestinal: Positive for heartburn. Negative for nausea and vomiting.  Genitourinary:       Negative for polyuria.  Musculoskeletal:       Negative for muscle weakness.  Endo/Heme/Allergies: Negative for polydipsia.   PHYSICAL EXAM: Blood pressure 128/83, pulse 85, temperature 98.1 F (36.7 C), temperature source Oral, height 5\' 9"  (1.753 m), weight 230 lb (104.3 kg), last menstrual period 11/05/2015, SpO2 95 %. Body  mass index is 33.97 kg/m. Physical Exam Vitals signs reviewed.  Constitutional:      Appearance: Normal appearance. She is obese.  Cardiovascular:     Rate and Rhythm: Normal rate.  Pulmonary:     Effort: Pulmonary effort is normal.  Musculoskeletal: Normal range of motion.  Skin:    General: Skin is warm and dry.  Neurological:     Mental Status: She is alert and oriented to person, place, and time.  Psychiatric:        Mood and Affect: Mood normal.        Behavior: Behavior normal.    RECENT LABS AND TESTS: BMET    Component Value Date/Time   NA 139 12/17/2017 1035   K 4.4 12/17/2017 1035   CL 100 12/17/2017 1035   CO2 25 12/17/2017 1035   GLUCOSE 84 12/17/2017 1035   GLUCOSE 81 08/30/2015 1530   BUN 10 12/17/2017 1035   CREATININE 0.87 12/17/2017 1035   CALCIUM 9.6 12/17/2017 1035   GFRNONAA 79 12/17/2017 1035   GFRAA 91 12/17/2017 1035   Lab Results  Component Value Date   HGBA1C 5.7 (H) 12/17/2017   Lab Results  Component Value Date   INSULIN 7.8 12/17/2017   CBC    Component Value Date/Time   WBC 3.6 12/17/2017 1035   WBC 5.6 08/30/2015 1530   RBC 5.34 (H) 12/17/2017 1035   RBC 5.02 08/30/2015 1530   HGB 14.1 12/17/2017 1035   HCT 43.7 12/17/2017 1035   PLT 227 12/17/2017 1035   MCV 82 12/17/2017 1035   MCH 26.4 (L) 12/17/2017 1035   MCH 27.1 08/30/2015 1530   MCHC 32.3 12/17/2017 1035   MCHC 32.9 08/30/2015 1530   RDW 12.9 12/17/2017 1035   LYMPHSABS 1.7 12/17/2017 1035   MONOABS 0.5 04/02/2013 0422   EOSABS 0.0 12/17/2017 1035   BASOSABS 0.0 12/17/2017 1035   Iron/TIBC/Ferritin/ %Sat No results found for: IRON, TIBC, FERRITIN, IRONPCTSAT Lipid Panel     Component Value Date/Time   CHOL 217 (H) 12/17/2017 1035   TRIG 61 12/17/2017 1035   HDL 50 12/17/2017 1035   CHOLHDL 4.3 12/17/2017 1035   LDLCALC 155 (H) 12/17/2017 1035   Hepatic Function Panel  Component Value Date/Time   PROT 7.2 12/17/2017 1035   ALBUMIN 4.7 12/17/2017  1035   AST 20 12/17/2017 1035   ALT 17 12/17/2017 1035   ALKPHOS 58 12/17/2017 1035   BILITOT 0.4 12/17/2017 1035      Component Value Date/Time   TSH 1.240 12/17/2017 1035   Results for SHANDRELL, BODA (MRN [id number]) as of 02/25/2018 07:30  Ref. Range 12/17/2017 10:35  Vitamin D, 25-Hydroxy Latest Ref Range: 30.0 - 100.0 ng/mL 26.6 (L)    OBESITY BEHAVIORAL INTERVENTION VISIT  Today's visit was # 5   Starting weight: 234 lbs Starting date: 12/17/17 Today's weight : Weight: 230 lb (104.3 kg)  Today's date: 02/21/2018 Total lbs lost to date: 4  ASK: We discussed the diagnosis of obesity with Kanoelani B Sillas today and Temitope agreed to give [place] permission to discuss obesity behavioral modification therapy today.  ASSESS: Tomeshia has the diagnosis of obesity and her BMI today is 33.9. Marquerite is in the action stage of change.   ADVISE: Lorita was educated on the multiple health risks of obesity as well as the benefit of weight loss to improve her health. She was advised of the need for long term treatment and the importance of lifestyle modifications to improve her current health and to decrease her risk of future health problems.  AGREE: Multiple dietary modification options and treatment options were discussed and Shakiara agreed to follow the recommendations documented in the above note.  ARRANGE: Loyola was educated on the importance of frequent visits to treat obesity as outlined per CMS and USPSTF guidelines and agreed to schedule her next follow up appointment today.  I, Marcille Blanco, am acting as Location manager for [employer], FNP-C.  I have reviewed the above documentation for accuracy and completeness, and I agree with the above.  - Hillary Schwegler, FNP-C.

## 2018-03-06 ENCOUNTER — Ambulatory Visit (INDEPENDENT_AMBULATORY_CARE_PROVIDER_SITE_OTHER): Payer: BC Managed Care – PPO | Admitting: Bariatrics

## 2018-03-06 ENCOUNTER — Encounter (INDEPENDENT_AMBULATORY_CARE_PROVIDER_SITE_OTHER): Payer: Self-pay

## 2018-03-06 ENCOUNTER — Encounter (INDEPENDENT_AMBULATORY_CARE_PROVIDER_SITE_OTHER): Payer: Self-pay | Admitting: Bariatrics

## 2018-03-06 VITALS — BP 124/82 | HR 78 | Temp 97.8°F | Ht 69.0 in | Wt 226.0 lb

## 2018-03-06 DIAGNOSIS — K219 Gastro-esophageal reflux disease without esophagitis: Secondary | ICD-10-CM

## 2018-03-06 DIAGNOSIS — Z6833 Body mass index (BMI) 33.0-33.9, adult: Secondary | ICD-10-CM

## 2018-03-06 DIAGNOSIS — E559 Vitamin D deficiency, unspecified: Secondary | ICD-10-CM

## 2018-03-06 DIAGNOSIS — Z9189 Other specified personal risk factors, not elsewhere classified: Secondary | ICD-10-CM

## 2018-03-06 DIAGNOSIS — E669 Obesity, unspecified: Secondary | ICD-10-CM | POA: Diagnosis not present

## 2018-03-06 MED ORDER — VITAMIN D (ERGOCALCIFEROL) 1.25 MG (50000 UNIT) PO CAPS: 50000.0000 [IU] | ORAL_CAPSULE | ORAL | 0 refills | Status: DC

## 2018-03-07 ENCOUNTER — Ambulatory Visit (INDEPENDENT_AMBULATORY_CARE_PROVIDER_SITE_OTHER): Payer: Self-pay | Admitting: Family Medicine

## 2018-03-07 NOTE — Progress Notes (Signed)
Office: [phone]  /  Fax: [phone]   HPI:   Chief Complaint: OBESITY [name] is here to discuss her progress with her obesity treatment plan. She is on the Category 2 plan and is following her eating plan approximately 80 to 90 % of the time. She states she is walking 45 to 50 minutes 2 times per week. [name] has done well since her last visit. She is struggling with journaling. Her weight is 226 lb (102.5 kg) today and has had a weight loss of 4 pounds over a period of 2 weeks since her last visit. She has lost 8 lbs since starting treatment with [place].  Vitamin D deficiency [name] has a diagnosis of vitamin D deficiency. She is currently taking vit D and denies nausea, vomiting or muscle weakness.  At risk for osteopenia and osteoporosis [name] is at higher risk of osteopenia and osteoporosis due to vitamin D deficiency.   GERD (gastroesophageal reflux disease) [name] has a diagnosis of GERD and she is using OTC medications (Mylanta). She has a history of hiatal hernia and IBS. Her GERD is better when she is eating at home.  ASSESSMENT AND PLAN:  Vitamin D deficiency - Plan: Vitamin D, Ergocalciferol, (DRISDOL) 1.25 MG (50000 UT) CAPS capsule  Gastroesophageal reflux disease, esophagitis presence not specified  At risk for osteoporosis  Class 1 obesity with serious comorbidity and body mass index (BMI) of 33.0 to 33.9 in adult, unspecified obesity type  PLAN:  Vitamin D Deficiency [name] was informed that low vitamin D levels contributes to fatigue and are associated with obesity, breast, and colon cancer. She agrees to continue to take prescription Vit D @50 ,000 IU every week #4 with no refills and will follow up for routine testing of vitamin D, at least 2-3 times per year. She was informed of the risk of over-replacement of vitamin D and agrees to not increase her dose unless she discusses this with [place] first. [name] agrees to follow up as directed.  At risk for  osteopenia and osteoporosis [name] was given extended  (15 minutes) osteoporosis prevention counseling today. [name] is at risk for osteopenia and osteoporosis due to her vitamin D deficiency. She was encouraged to take her vitamin D and follow her higher calcium diet and increase strengthening exercise to help strengthen her bones and decrease her risk of osteopenia and osteoporosis.  GERD (gastroesophageal reflux disease) [name] will be aware of her triggers for heartburn and she will use OTC Ranitidine 150 mg or Mylanta. [name] agrees to follow up as directed.  Obesity [name] is currently in the action stage of change. As such, her goal is to continue with weight loss efforts  She has agreed to keep a food journal with 300 to 400 calories and 30 grams of protein at lunch daily and follow the Category 2 plan. She will resume journaling (MyFitnessPal). [name] has been instructed to work up to a goal of 150 minutes of combined cardio and strengthening exercise per week for weight loss and overall health benefits. We discussed the following Behavioral Modification Strategies today: increase H2O intake and we discussed ways to increase water, keeping healthy foods in the home, increasing lean protein intake (protein content for foods), decreasing simple carbohydrates , increasing vegetables and work on meal planning and easy cooking plans  [name] has agreed to follow up with our clinic in 2 weeks. She was informed of the importance of frequent follow up visits to maximize her success with intensive lifestyle modifications for her  multiple health conditions.  ALLERGIES: Allergies  Allergen Reactions  . Amoxicillin Hives  . Sulfa Antibiotics Hives  . Penicillins Hives and Itching    Has patient had a PCN reaction causing immediate rash, facial/tongue/throat swelling, SOB or lightheadedness with hypotension: Yes Has patient had a PCN reaction causing severe rash involving mucus membranes or skin  necrosis: No Has patient had a PCN reaction that required hospitalization No Has patient had a PCN reaction occurring within the last 10 years: Yes If all of the above answers are "NO", then may proceed with Cephalosporin use.   . Tape     PLASTIC TAPE   (WHELPS, TORE SKIN)  . Tramadol Hives    MEDICATIONS: Current Outpatient Medications on File Prior to Visit  Medication Sig Dispense Refill  . acetaminophen-codeine (TYLENOL #3) 300-30 MG per tablet Take 1 tablet by mouth every 4 (four) hours as needed for moderate pain.     [name] Kitchen albuterol (PROVENTIL HFA;VENTOLIN HFA) 108 (90 Base) MCG/ACT inhaler 2 puffs q 4 hours as neededFor shortness of breath  Ok to sub with proair (covered rx) 18 g 1  . linaclotide (LINZESS) 145 MCG CAPS capsule Take 145 mcg by mouth daily before breakfast.    . mometasone (NASONEX) 50 MCG/ACT nasal spray Place 2 sprays into the nose daily. 17 g 2  . montelukast (SINGULAIR) 10 MG tablet Take 1 tablet (10 mg total) by mouth daily. 90 tablet 1  . Probiotic Product (PROBIOTIC PO) Take 1 capsule by mouth daily.     No current facility-administered medications on file prior to visit.     PAST MEDICAL HISTORY: Past Medical History:  Diagnosis Date  . Abdominal pain   . Allergy   . Allergy-induced asthma   . Angina 05/2011   NO HEART PROBLEM WENT TO ED 05/22/11 FOR CP STRESS TEST WAS NEGATIVE   . Ankle fracture   . Chronic back pain greater than 3 months duration 01/17/2011   injured lumbar and posterior cervical neck in fall   . Constipation   . DDD (degenerative disc disease), cervical   . Diverticulitis   . Gallbladder problem   . GERD (gastroesophageal reflux disease)   . H/O hiatal hernia   . Hyperlipidemia   . IBS (irritable bowel syndrome)   . Nausea   . Neuromuscular disorder ([place])    LUMBA BACK  TROUBLE   . Pre-diabetes   . Rash    UNDER BIL BREASTS  . Wrist fracture     PAST SURGICAL HISTORY: Past Surgical History:  Procedure Laterality Date    . BREAST CYST EXCISION Left ~ 1983  . CHOLECYSTECTOMY  08/2010  . COLONOSCOPY  07/2010   Removal of 3 polyps  . DILATATION & CURETTAGE/HYSTEROSCOPY WITH MYOSURE N/A 09/03/2015   Procedure: DILATATION & CURETTAGE/HYSTEROSCOPY WITH MYOSURE;  Surgeon: Servando Salina, MD;  Location: [facility];  Service: Gynecology;  Laterality: N/A;  . FRACTURE SURGERY  06/12/11   patient denies  . HERNIA REPAIR  1740; 09/1446   umbilical corrected during tubal ligation; VHR w/mesh  . TUBAL LIGATION  12/1995  . VENTRAL HERNIA REPAIR  06/12/2011   Procedure: LAPAROSCOPIC VENTRAL HERNIA;  Surgeon: Gwenyth Ober, MD;  Location: [facility];  Service: General;  Laterality: N/A;  . WISDOM TOOTH EXTRACTION  1990's    SOCIAL HISTORY: Social History   Tobacco Use  . Smoking status: Never Smoker  . Smokeless tobacco: Never Used  Substance Use Topics  . Alcohol use: Yes  . Drug  use: No    FAMILY HISTORY: Family History  Problem Relation Age of Onset  . Diabetes Mother   . Hyperlipidemia Mother   . Cancer Mother   . Sleep apnea Mother   . Obesity Mother   . Heart disease Father   . Cancer Father        kidney and prostate  . Hyperlipidemia Father   . Other Father        gout and clogged arteries  . Hypertension Father   . Kidney disease Father   . Alcoholism Father   . Hyperlipidemia Sister   . Diabetes Sister   . Diabetes Brother   . Hyperlipidemia Brother   . Heart disease Maternal Grandmother     ROS: Review of Systems  Constitutional: Positive for weight loss.  Gastrointestinal: Negative for nausea and vomiting.  Musculoskeletal:       Negative for muscle weakness    PHYSICAL EXAM: Blood pressure 124/82, pulse 78, temperature 97.8 F (36.6 C), temperature source Oral, height 5\' 9"  (1.753 m), weight 226 lb (102.5 kg), last menstrual period 11/05/2015, SpO2 96 %. Body mass index is 33.37 kg/m. Physical Exam Vitals signs reviewed.  Constitutional:      Appearance: Normal appearance. She  is well-developed. She is obese.  Cardiovascular:     Rate and Rhythm: Normal rate.  Pulmonary:     Effort: Pulmonary effort is normal.  Musculoskeletal: Normal range of motion.  Skin:    General: Skin is warm and dry.  Neurological:     Mental Status: She is alert and oriented to person, place, and time.  Psychiatric:        Mood and Affect: Mood normal.        Behavior: Behavior normal.     RECENT LABS AND TESTS: BMET    Component Value Date/Time   NA 139 12/17/2017 1035   K 4.4 12/17/2017 1035   CL 100 12/17/2017 1035   CO2 25 12/17/2017 1035   GLUCOSE 84 12/17/2017 1035   GLUCOSE 81 08/30/2015 1530   BUN 10 12/17/2017 1035   CREATININE 0.87 12/17/2017 1035   CALCIUM 9.6 12/17/2017 1035   GFRNONAA 79 12/17/2017 1035   GFRAA 91 12/17/2017 1035   Lab Results  Component Value Date   HGBA1C 5.7 (H) 12/17/2017   Lab Results  Component Value Date   INSULIN 7.8 12/17/2017   CBC    Component Value Date/Time   WBC 3.6 12/17/2017 1035   WBC 5.6 08/30/2015 1530   RBC 5.34 (H) 12/17/2017 1035   RBC 5.02 08/30/2015 1530   HGB 14.1 12/17/2017 1035   HCT 43.7 12/17/2017 1035   PLT 227 12/17/2017 1035   MCV 82 12/17/2017 1035   MCH 26.4 (L) 12/17/2017 1035   MCH 27.1 08/30/2015 1530   MCHC 32.3 12/17/2017 1035   MCHC 32.9 08/30/2015 1530   RDW 12.9 12/17/2017 1035   LYMPHSABS 1.7 12/17/2017 1035   MONOABS 0.5 04/02/2013 0422   EOSABS 0.0 12/17/2017 1035   BASOSABS 0.0 12/17/2017 1035   Iron/TIBC/Ferritin/ %Sat No results found for: IRON, TIBC, FERRITIN, IRONPCTSAT Lipid Panel     Component Value Date/Time   CHOL 217 (H) 12/17/2017 1035   TRIG 61 12/17/2017 1035   HDL 50 12/17/2017 1035   CHOLHDL 4.3 12/17/2017 1035   LDLCALC 155 (H) 12/17/2017 1035   Hepatic Function Panel     Component Value Date/Time   PROT 7.2 12/17/2017 1035   ALBUMIN 4.7 12/17/2017 1035  AST 20 12/17/2017 1035   ALT 17 12/17/2017 1035   ALKPHOS 58 12/17/2017 1035   BILITOT 0.4  12/17/2017 1035      Component Value Date/Time   TSH 1.240 12/17/2017 1035     Ref. Range 12/17/2017 10:35  Vitamin D, 25-Hydroxy Latest Ref Range: 30.0 - 100.0 ng/mL 26.6 (L)     OBESITY BEHAVIORAL INTERVENTION VISIT  Today's visit was # 6   Starting weight: 234 lbs Starting date: 12/17/2017 Today's weight : 226 lbs  Today's date: 03/06/2018 Total lbs lost to date: 8   ASK: We discussed the diagnosis of obesity with [name] today and [name] agreed to give [place] permission to discuss obesity behavioral modification therapy today.  ASSESS: [name] has the diagnosis of obesity and her BMI today is 33.36 [name] is in the action stage of change   ADVISE: Earlyne was educated on the multiple health risks of obesity as well as the benefit of weight loss to improve her health. She was advised of the need for long term treatment and the importance of lifestyle modifications to improve her current health and to decrease her risk of future health problems.  AGREE: Multiple dietary modification options and treatment options were discussed and  [name] agreed to follow the recommendations documented in the above note.  ARRANGE: Mardelle was educated on the importance of frequent visits to treat obesity as outlined per CMS and USPSTF guidelines and agreed to schedule her next follow up appointment today.  Corey Skains, am acting as Location manager for [employer]. Owens Shark, DO  I have reviewed the above documentation for accuracy and completeness, and I agree with the above. -Jearld Lesch, DO

## 2018-03-26 ENCOUNTER — Encounter (INDEPENDENT_AMBULATORY_CARE_PROVIDER_SITE_OTHER): Payer: Self-pay | Admitting: Family Medicine

## 2018-03-27 ENCOUNTER — Encounter (INDEPENDENT_AMBULATORY_CARE_PROVIDER_SITE_OTHER): Payer: Self-pay

## 2018-03-27 ENCOUNTER — Ambulatory Visit (INDEPENDENT_AMBULATORY_CARE_PROVIDER_SITE_OTHER): Payer: Self-pay | Admitting: Family Medicine

## 2018-04-10 ENCOUNTER — Encounter (INDEPENDENT_AMBULATORY_CARE_PROVIDER_SITE_OTHER): Payer: Self-pay

## 2018-04-19 ENCOUNTER — Other Ambulatory Visit: Payer: Self-pay | Admitting: Obstetrics and Gynecology

## 2018-04-19 DIAGNOSIS — N6320 Unspecified lump in the left breast, unspecified quadrant: Secondary | ICD-10-CM

## 2018-05-03 ENCOUNTER — Other Ambulatory Visit: Payer: BC Managed Care – PPO

## 2018-05-06 ENCOUNTER — Other Ambulatory Visit: Payer: BC Managed Care – PPO

## 2018-05-21 ENCOUNTER — Encounter (INDEPENDENT_AMBULATORY_CARE_PROVIDER_SITE_OTHER): Payer: Self-pay

## 2018-06-12 ENCOUNTER — Encounter: Payer: Self-pay | Admitting: Internal Medicine

## 2018-06-14 ENCOUNTER — Other Ambulatory Visit: Payer: Self-pay

## 2018-06-14 ENCOUNTER — Ambulatory Visit
Admission: RE | Admit: 2018-06-14 | Discharge: 2018-06-14 | Disposition: A | Discharge status: Home / Self Care | Payer: BC Managed Care – PPO | Source: Ambulatory Visit | Attending: Obstetrics and Gynecology | Admitting: Obstetrics and Gynecology

## 2018-06-14 DIAGNOSIS — N6320 Unspecified lump in the left breast, unspecified quadrant: Secondary | ICD-10-CM

## 2018-06-17 ENCOUNTER — Encounter: Payer: Self-pay | Admitting: Internal Medicine

## 2018-07-08 ENCOUNTER — Encounter: Payer: Self-pay | Admitting: Internal Medicine

## 2018-08-06 ENCOUNTER — Encounter: Payer: Self-pay | Admitting: Nurse Practitioner

## 2018-10-08 ENCOUNTER — Other Ambulatory Visit (HOSPITAL_COMMUNITY)
Admission: RE | Admit: 2018-10-08 | Discharge: 2018-10-08 | Disposition: A | Discharge status: Home / Self Care | Payer: BC Managed Care – PPO | Source: Ambulatory Visit | Attending: Nurse Practitioner | Admitting: Nurse Practitioner

## 2018-10-08 ENCOUNTER — Encounter: Payer: Self-pay | Admitting: Nurse Practitioner

## 2018-10-08 ENCOUNTER — Other Ambulatory Visit: Payer: Self-pay

## 2018-10-08 ENCOUNTER — Ambulatory Visit: Payer: BC Managed Care – PPO | Admitting: Nurse Practitioner

## 2018-10-08 VITALS — BP 122/76 | HR 65 | Temp 97.6°F | Ht 66.8 in | Wt 218.2 lb

## 2018-10-08 DIAGNOSIS — Z23 Encounter for immunization: Secondary | ICD-10-CM

## 2018-10-08 DIAGNOSIS — E559 Vitamin D deficiency, unspecified: Secondary | ICD-10-CM | POA: Diagnosis not present

## 2018-10-08 DIAGNOSIS — Z8639 Personal history of other endocrine, nutritional and metabolic disease: Secondary | ICD-10-CM

## 2018-10-08 DIAGNOSIS — E782 Mixed hyperlipidemia: Secondary | ICD-10-CM

## 2018-10-08 DIAGNOSIS — R350 Frequency of micturition: Secondary | ICD-10-CM | POA: Insufficient documentation

## 2018-10-08 DIAGNOSIS — Z113 Encounter for screening for infections with a predominantly sexual mode of transmission: Secondary | ICD-10-CM | POA: Diagnosis not present

## 2018-10-08 DIAGNOSIS — Z0000 Encounter for general adult medical examination without abnormal findings: Secondary | ICD-10-CM | POA: Diagnosis not present

## 2018-10-08 DIAGNOSIS — R7303 Prediabetes: Secondary | ICD-10-CM

## 2018-10-08 DIAGNOSIS — N898 Other specified noninflammatory disorders of vagina: Secondary | ICD-10-CM | POA: Insufficient documentation

## 2018-10-08 LAB — POCT URINALYSIS DIPSTICK
Bilirubin, UA: NEGATIVE
Blood, UA: NEGATIVE
Glucose, UA: NEGATIVE
Ketones, UA: NEGATIVE
Leukocytes, UA: NEGATIVE
Nitrite, UA: NEGATIVE
Protein, UA: NEGATIVE
Spec Grav, UA: 1.025 (ref 1.010–1.025)
Urobilinogen, UA: 0.2 E.U./dL
pH, UA: 5.5 (ref 5.0–8.0)

## 2018-10-08 MED ORDER — TETANUS-DIPHTH-ACELL PERTUSSIS 5-2.5-18.5 LF-MCG/0.5 IM SUSP
0.5000 mL | Freq: Once | INTRAMUSCULAR | Status: AC
  Administered 2018-10-08: 11:00:00 0.5 mL via INTRAMUSCULAR

## 2018-10-08 MED ORDER — ALBUTEROL SULFATE HFA 108 (90 BASE) MCG/ACT IN AERS: INHALATION_SPRAY | RESPIRATORY_TRACT | 1 refills | Status: DC

## 2018-10-08 NOTE — Patient Instructions (Signed)
Health Maintenance  Topic Date Due  . OPHTHALMOLOGY EXAM  01/29/1979  . HEMOGLOBIN A1C  06/18/2018  . PAP SMEAR-Modifier  08/30/2018  . INFLUENZA VACCINE  09/21/2018  . FOOT EXAM  10/08/2019  . URINE MICROALBUMIN  10/08/2019  . TETANUS/TDAP  10/07/2028  . PNEUMOCOCCAL POLYSACCHARIDE VACCINE AGE 50-64 HIGH RISK  Completed  . HIV Screening  Completed   Pneumococcal Conjugate Vaccine (PCV13): What You Need to Know 1. Why get vaccinated? Pneumococcal conjugate vaccine (PCV13) can prevent pneumococcal disease. Pneumococcal disease refers to any illness caused by pneumococcal bacteria. These bacteria can cause many types of illnesses, including pneumonia, which is an infection of the lungs. Pneumococcal bacteria are one of the most common causes of pneumonia. Besides pneumonia, pneumococcal bacteria can also cause:  Ear infections  Sinus infections  Meningitis (infection of the tissue covering the brain and spinal cord)  Bacteremia (bloodstream infection) Anyone can get pneumococcal disease, but children under 24 years of age, people with certain medical conditions, adults 24 years or older, and cigarette smokers are at the highest risk. Most pneumococcal infections are mild. However, some can result in long-term problems, such as brain damage or hearing loss. Meningitis, bacteremia, and pneumonia caused by pneumococcal disease can be fatal. 2. PCV13 PCV13 protects against 13 types of bacteria that cause pneumococcal disease. Infants and young children usually need 4 doses of pneumococcal conjugate vaccine, at 2, 4, 6, and 70-80 months of age. In some cases, a child might need fewer than 4 doses to complete PCV13 vaccination. A dose of PCV23 vaccine is also recommended for anyone 2 years or older with certain medical conditions if they did not already receive PCV13. This vaccine may be given to adults 19 years or older based on discussions between the patient and health care provider. 3. Talk  with your health care provider Tell your vaccine provider if the person getting the vaccine:  Has had an allergic reaction after a previous dose of PCV13, to an earlier pneumococcal conjugate vaccine known as PCV7, or to any vaccine containing diphtheria toxoid (for example, DTaP), or has any severe, life-threatening allergies.  In some cases, your health care provider may decide to postpone PCV13 vaccination to a future visit. People with minor illnesses, such as a cold, may be vaccinated. People who are moderately or severely ill should usually wait until they recover before getting PCV13. Your health care provider can give you more information. 4. Risks of a vaccine reaction  Redness, swelling, pain, or tenderness where the shot is given, and fever, loss of appetite, fussiness (irritability), feeling tired, headache, and chills can happen after PCV13. Young children may be at increased risk for seizures caused by fever after PCV13 if it is administered at the same time as inactivated influenza vaccine. Ask your health care provider for more information. People sometimes faint after medical procedures, including vaccination. Tell your provider if you feel dizzy or have vision changes or ringing in the ears. As with any medicine, there is a very remote chance of a vaccine causing a severe allergic reaction, other serious injury, or death. 5. What if there is a serious problem? An allergic reaction could occur after the vaccinated person leaves the clinic. If you see signs of a severe allergic reaction (hives, swelling of the face and throat, difficulty breathing, a fast heartbeat, dizziness, or weakness), call 9-1-1 and get the person to the nearest hospital. For other signs that concern you, call your health care provider. Adverse reactions should  be reported to the Vaccine Adverse Event Reporting System (VAERS). Your health care provider will usually file this report, or you can do it yourself.  Visit the VAERS website at www.vaers.SamedayNews.es or call (803)311-6763. VAERS is only for reporting reactions, and VAERS staff do not give medical advice. 6. The National Vaccine Injury Compensation Program The [employer] Vaccine Injury Compensation Program (VICP) is a federal program that was created to compensate people who may have been injured by certain vaccines. Visit the VICP website at GoldCloset.com.ee or call 769-257-7931 to learn about the program and about filing a claim. There is a time limit to file a claim for compensation. 7. How can I learn more?  Ask your health care provider.  Call your local or state health department.  Contact the Centers for Disease Control and Prevention (CDC): ? Call 8507808444 (1-800-CDC-INFO) or ? Visit CDC's website at http://hunter.com/ Vaccine Information Statement PCV13 Vaccine (12/19/2017) This information is not intended to replace advice given to you by your health care provider. Make sure you discuss any questions you have with your health care provider. Document Released: 12/04/2005 Document Revised: 05/28/2018 Document Reviewed: 09/18/2017 Elsevier Patient Education  [place] (Tetanus and Diphtheria): What You Need to Know 1. Why get vaccinated? Tetanus  and diphtheria are very serious diseases. They are rare in the [place] today, but people who do become infected often have severe complications. Td vaccine is used to protect adolescents and adults from both of these diseases. Both tetanus and diphtheria are infections caused by bacteria. Diphtheria spreads from person to person through coughing or sneezing. Tetanus-causing bacteria enter the body through cuts, scratches, or wounds. TETANUS (Lockjaw) causes painful muscle tightening and stiffness, usually all over the body.  It can lead to tightening of muscles in the head and neck so you can't open your mouth, swallow, or sometimes even breathe.  Tetanus kills about 1 out of every 10 people who are infected even after receiving the best medical care. DIPHTHERIA can cause a thick coating to form in the back of the throat.  It can lead to breathing problems, paralysis, heart failure, and death. Before vaccines, as many as 200,000 cases of diphtheria and hundreds of cases of tetanus were reported in the [place] each year. Since vaccination began, reports of cases for both diseases have dropped by about 99%. 2. Td vaccine Td vaccine can protect adolescents and adults from tetanus and diphtheria. Td is usually given as a booster dose every 10 years but it can also be given earlier after a severe and dirty wound or burn. Another vaccine, called Tdap, which protects against pertussis in addition to tetanus and diphtheria, is sometimes recommended instead of Td vaccine. Your doctor or the person giving you the vaccine can give you more information. Td may safely be given at the same time as other vaccines. 3. Some people should not get this vaccine  A person who has ever had a life-threatening allergic reaction after a previous dose of any tetanus or diphtheria containing vaccine, OR has a severe allergy to any part of this vaccine, should not get Td vaccine. Tell the person giving the vaccine about any severe allergies.  Talk to your doctor if you: ? had severe pain or swelling after any vaccine containing diphtheria or tetanus, ? ever had a condition called Guillain Barr Syndrome (GBS), ? aren't feeling well on the day the shot is scheduled. 4. Risks of a vaccine reaction With any medicine, including  vaccines, there is a chance of side effects. These are usually mild and go away on their own. Serious reactions are also possible but are rare. Most people who get Td vaccine do not have any problems with it. Mild Problems following Td vaccine: (Did not interfere with activities)  Pain where the shot was given (about 8 people in  10)  Redness or swelling where the shot was given (about 1 person in 4)  Mild fever (rare)  Headache (about 1 person in 4)  Tiredness (about 1 person in 4) Moderate Problems following Td vaccine: (Interfered with activities, but did not require medical attention)  Fever over 102F (rare) Severe Problems following Td vaccine: (Unable to perform usual activities; required medical attention)  Swelling, severe pain, bleeding and/or redness in the arm where the shot was given (rare). Problems that could happen after any vaccine:  People sometimes faint after a medical procedure, including vaccination. Sitting or lying down for about 15 minutes can help prevent fainting, and injuries caused by a fall. Tell your doctor if you feel dizzy, or have vision changes or ringing in the ears.  Some people get severe pain in the shoulder and have difficulty moving the arm where a shot was given. This happens very rarely.  Any medication can cause a severe allergic reaction. Such reactions from a vaccine are very rare, estimated at fewer than 1 in a million doses, and would happen within a few minutes to a few hours after the vaccination. As with any medicine, there is a very remote chance of a vaccine causing a serious injury or death. The safety of vaccines is always being monitored. For more information, visit: http://www.aguilar.org/ 5. What if there is a serious reaction? What should I look for?  Look for anything that concerns you, such as signs of a severe allergic reaction, very high fever, or unusual behavior. Signs of a severe allergic reaction can include hives, swelling of the face and throat, difficulty breathing, a fast heartbeat, dizziness, and weakness. These would usually start a few minutes to a few hours after the vaccination. What should I do?  If you think it is a severe allergic reaction or other emergency that can't wait, call 9-1-1 or get the person to the nearest hospital.  Otherwise, call your doctor.  Afterward, the reaction should be reported to the Vaccine Adverse Event Reporting System (VAERS). Your doctor might file this report, or you can do it yourself through the VAERS web site at www.vaers.SamedayNews.es, or by calling (984)309-6071. VAERS does not give medical advice. 6. The National Vaccine Injury Compensation Program The [employer] Vaccine Injury Compensation Program (VICP) is a federal program that was created to compensate people who may have been injured by certain vaccines. Persons who believe they may have been injured by a vaccine can learn about the program and about filing a claim by calling 270-685-5134 or visiting the [place] website at GoldCloset.com.ee. There is a time limit to file a claim for compensation. 7. How can I learn more?  Ask your doctor. He or she can give you the vaccine package insert or suggest other sources of information.  Call your local or state health department.  Contact the Centers for Disease Control and Prevention (CDC): ? Call 918-365-0147 (1-800-CDC-INFO) ? Visit CDC's website at http://hunter.com/ Vaccine Information Statement Td Vaccine (06/01/15) This information is not intended to replace advice given to you by your health care provider. Make sure you discuss any questions you have with your health  care provider. Document Released: 12/04/2005 Document Revised: 09/24/2017 Document Reviewed: 09/24/2017 Elsevier Interactive Patient Education  2020 [place] Maintenance, Female Adopting a healthy lifestyle and getting preventive care are important in promoting health and wellness. Ask your health care provider about:  The right schedule for you to have regular tests and exams.  Things you can do on your own to prevent diseases and keep yourself healthy. What should I know about diet, weight, and exercise? Eat a healthy diet   Eat a diet that includes plenty of vegetables, fruits,  low-fat dairy products, and lean protein.  Do not eat a lot of foods that are high in solid fats, added sugars, or sodium. Maintain a healthy weight Body mass index (BMI) is used to identify weight problems. It estimates body fat based on height and weight. Your health care provider can help determine your BMI and help you achieve or maintain a healthy weight. Get regular exercise Get regular exercise. This is one of the most important things you can do for your health. Most adults should:  Exercise for at least 150 minutes each week. The exercise should increase your heart rate and make you sweat (moderate-intensity exercise).  Do strengthening exercises at least twice a week. This is in addition to the moderate-intensity exercise.  Spend less time sitting. Even light physical activity can be beneficial. Watch cholesterol and blood lipids Have your blood tested for lipids and cholesterol at 50 years of age, then have this test every 5 years. Have your cholesterol levels checked more often if:  Your lipid or cholesterol levels are high.  You are older than 50 years of age.  You are at high risk for heart disease. What should I know about cancer screening? Depending on your health history and family history, you may need to have cancer screening at various ages. This may include screening for:  Breast cancer.  Cervical cancer.  Colorectal cancer.  Skin cancer.  Lung cancer. What should I know about heart disease, diabetes, and high blood pressure? Blood pressure and heart disease  High blood pressure causes heart disease and increases the risk of stroke. This is more likely to develop in people who have high blood pressure readings, are of African descent, or are overweight.  Have your blood pressure checked: ? Every 3-5 years if you are 6-81 years of age. ? Every year if you are 46 years old or older. Diabetes Have regular diabetes screenings. This checks your fasting  blood sugar level. Have the screening done:  Once every three years after age 76 if you are at a normal weight and have a low risk for diabetes.  More often and at a younger age if you are overweight or have a high risk for diabetes. What should I know about preventing infection? Hepatitis B If you have a higher risk for hepatitis B, you should be screened for this virus. Talk with your health care provider to find out if you are at risk for hepatitis B infection. Hepatitis C Testing is recommended for:  Everyone born from [birth year] through 1967.  Anyone with known risk factors for hepatitis C. Sexually transmitted infections (STIs)  Get screened for STIs, including gonorrhea and chlamydia, if: ? You are sexually active and are younger than 50 years of age. ? You are older than 50 years of age and your health care provider tells you that you are at risk for this type of infection. ? Your sexual activity has changed  since you were last screened, and you are at increased risk for chlamydia or gonorrhea. Ask your health care provider if you are at risk.  Ask your health care provider about whether you are at high risk for HIV. Your health care provider may recommend a prescription medicine to help prevent HIV infection. If you choose to take medicine to prevent HIV, you should first get tested for HIV. You should then be tested every 3 months for as long as you are taking the medicine. Pregnancy  If you are about to stop having your period (premenopausal) and you may become pregnant, seek counseling before you get pregnant.  Take 400 to 800 micrograms (mcg) of folic acid every day if you become pregnant.  Ask for birth control (contraception) if you want to prevent pregnancy. Osteoporosis and menopause Osteoporosis is a disease in which the bones lose minerals and strength with aging. This can result in bone fractures. If you are 74 years old or older, or if you are at risk for osteoporosis and  fractures, ask your health care provider if you should:  Be screened for bone loss.  Take a calcium or vitamin D supplement to lower your risk of fractures.  Be given hormone replacement therapy (HRT) to treat symptoms of menopause. Follow these instructions at home: Lifestyle  Do not use any products that contain nicotine or tobacco, such as cigarettes, e-cigarettes, and chewing tobacco. If you need help quitting, ask your health care provider.  Do not use street drugs.  Do not share needles.  Ask your health care provider for help if you need support or information about quitting drugs. Alcohol use  Do not drink alcohol if: ? Your health care provider tells you not to drink. ? You are pregnant, may be pregnant, or are planning to become pregnant.  If you drink alcohol: ? Limit how much you use to 0-1 drink a day. ? Limit intake if you are breastfeeding.  Be aware of how much alcohol is in your drink. In the [place] one drink equals one 12 oz bottle of beer (355 mL), one 5 oz glass of wine (148 mL), or one 1 oz glass of hard liquor (44 mL). General instructions  Schedule regular health, dental, and eye exams.  Stay current with your vaccines.  Tell your health care provider if: ? You often feel depressed. ? You have ever been abused or do not feel safe at home. Summary  Adopting a healthy lifestyle and getting preventive care are important in promoting health and wellness.  Follow your health care provider's instructions about healthy diet, exercising, and getting tested or screened for diseases.  Follow your health care provider's instructions on monitoring your cholesterol and blood pressure. This information is not intended to replace advice given to you by your health care provider. Make sure you discuss any questions you have with your health care provider. Document Released: 08/22/2010 Document Revised: 01/30/2018 Document Reviewed: 01/30/2018 Elsevier Patient  Education  2020 [employer].

## 2018-10-08 NOTE — Progress Notes (Addendum)
Subjective:     Patient ID: [name] Age , female    DOB: 1971-01-05 , 48 y.o.   MRN: [id number]   Chief Complaint  Patient presents with  . Annual Exam   The patient states she uses tubal ligation for birth control. Last LMP was Patient's last menstrual period was 11/05/2015.. Negative for Dysmenorrhea and Negative for Menorrhagia Mammogram last done 10/25/2017 bilateral and diagnostic 06/14/2018.  Negative for: breast discharge, breast lump(s), breast pain and breast self exam.  Pertinent negatives include abnormal bleeding (hematology), anxiety, decreased libido, depression, difficulty falling sleep, dyspareunia, history of infertility, nocturia, sexual dysfunction, sleep disturbances, urinary incontinence, urinary urgency, vaginal discharge and vaginal itching. Diet: low glycemic/low carbohydrates (eating healthier carbs). The patient states her exercise level is  walking 4 days a week 45 minutes to 1 hour.    The patient's tobacco use is:  Social History   Tobacco Use  Smoking Status Never Smoker  Smokeless Tobacco Never Used   She has been exposed to passive smoke. The patient's alcohol use is:  Social History   Substance and Sexual Activity  Alcohol Use Yes   Additional information: PAP is done with Dr. Garwin Brothers, will obtain a copy of her records   HPI  Here for HM  Wt Readings from Last 3 Encounters: 10/08/18 : 218 lb 3.2 oz (99 kg) 03/06/18 : 226 lb (102.5 kg) 02/21/18 : 230 lb (104.3 kg)   Drinking 64-75 oz water per day, goal to get to 100 oz per day She did go to the weight loss center but did not start any medications     Past Medical History:  Diagnosis Date  . Abdominal pain   . Allergy   . Allergy-induced asthma   . Angina 05/2011   NO HEART PROBLEM WENT TO ED 05/22/11 FOR CP STRESS TEST WAS NEGATIVE   . Ankle fracture   . Chronic back pain greater than 3 months duration 01/17/2011   injured lumbar and posterior cervical neck in fall   . Constipation    . DDD (degenerative disc disease), cervical   . Diverticulitis   . Gallbladder problem   . GERD (gastroesophageal reflux disease)   . H/O hiatal hernia   . Hyperlipidemia   . IBS (irritable bowel syndrome)   . Nausea   . Neuromuscular disorder ([place])    LUMBA BACK  TROUBLE   . Pre-diabetes   . Rash    UNDER BIL BREASTS  . Wrist fracture      Family History  Problem Relation Age of Onset  . Diabetes Mother   . Hyperlipidemia Mother   . Cancer Mother   . Sleep apnea Mother   . Obesity Mother   . Heart disease Father   . Cancer Father        kidney and prostate  . Hyperlipidemia Father   . Other Father        gout and clogged arteries  . Hypertension Father   . Kidney disease Father   . Alcoholism Father   . Hyperlipidemia Sister   . Diabetes Sister   . Diabetes Brother   . Hyperlipidemia Brother   . Heart disease Maternal Grandmother      Current Outpatient Medications:  .  acetaminophen-codeine (TYLENOL #3) 300-30 MG per tablet, Take 1 tablet by mouth every 4 (four) hours as needed for moderate pain. , Disp: , Rfl:  .  Fexofenadine HCl (ALLEGRA PO), Take 1 tablet by mouth daily., Disp: , Rfl:  .  montelukast (SINGULAIR) 10 MG tablet, Take 1 tablet (10 mg total) by mouth daily., Disp: 90 tablet, Rfl: 1 .  Probiotic Product (PROBIOTIC PO), Take 1 capsule by mouth daily., Disp: , Rfl:  .  albuterol (PROVENTIL HFA;VENTOLIN HFA) 108 (90 Base) MCG/ACT inhaler, 2 puffs q 4 hours as neededFor shortness of breath  Ok to sub with proair (covered rx) (Patient not taking: Reported on 10/08/2018), Disp: 18 g, Rfl: 1 .  linaclotide (LINZESS) 145 MCG CAPS capsule, Take 145 mcg by mouth daily before breakfast., Disp: , Rfl:  .  mometasone (NASONEX) 50 MCG/ACT nasal spray, Place 2 sprays into the nose daily. (Patient not taking: Reported on 10/08/2018), Disp: 17 g, Rfl: 2   Allergies  Allergen Reactions  . Amoxicillin Hives  . Sulfa Antibiotics Hives  . Penicillins Hives and Itching     Has patient had a PCN reaction causing immediate rash, facial/tongue/throat swelling, SOB or lightheadedness with hypotension: Yes Has patient had a PCN reaction causing severe rash involving mucus membranes or skin necrosis: No Has patient had a PCN reaction that required hospitalization No Has patient had a PCN reaction occurring within the last 10 years: Yes If all of the above answers are "NO", then may proceed with Cephalosporin use.   . Tape     PLASTIC TAPE   (WHELPS, TORE SKIN)  . Tramadol Hives     Review of Systems  Constitutional: Negative.   HENT: Negative.   Eyes: Negative.   Respiratory: Negative.   Cardiovascular: Negative.   Gastrointestinal: Negative.   Endocrine: Negative.   Genitourinary: Negative.   Musculoskeletal: Negative.   Skin: Negative.   Allergic/Immunologic: Negative.   Neurological: Negative.  Negative for dizziness and headaches.  Hematological: Negative.   Psychiatric/Behavioral: Negative.      Today's Vitals   10/08/18 1024  BP: 122/76  Pulse: 65  Temp: 97.6 F (36.4 C)  TempSrc: Oral  Weight: 218 lb 3.2 oz (99 kg)  Height: 5' 6.8" (1.697 m)  PainSc: 4   PainLoc: Back   Body mass index is 34.38 kg/m.   Objective:  Physical Exam Vitals signs reviewed.  Constitutional:      Appearance: Normal appearance. She is well-developed.  HENT:     Head: Normocephalic and atraumatic.     Right Ear: Hearing, tympanic membrane, ear canal and external ear normal. There is no impacted cerumen.     Left Ear: Hearing, tympanic membrane, ear canal and external ear normal. There is no impacted cerumen.  Eyes:     General: Lids are normal.     Extraocular Movements: Extraocular movements intact.     Conjunctiva/sclera: Conjunctivae normal.     Pupils: Pupils are equal, round, and reactive to light.     Funduscopic exam:    Right eye: No papilledema.        Left eye: No papilledema.  Neck:     Musculoskeletal: Full passive range of motion  without pain, normal range of motion and neck supple.     Thyroid: No thyroid mass.     Vascular: No carotid bruit.  Cardiovascular:     Rate and Rhythm: Normal rate and regular rhythm.     Pulses: Normal pulses.     Heart sounds: Normal heart sounds. No murmur.  Pulmonary:     Effort: Pulmonary effort is normal.     Breath sounds: Normal breath sounds.  Chest:     Breasts: Breasts are symmetrical.        Right:  Normal. No mass, nipple discharge or tenderness.        Left: Normal. No mass, nipple discharge or tenderness.  Abdominal:     General: Abdomen is flat. Bowel sounds are normal.     Palpations: Abdomen is soft.  Musculoskeletal: Normal range of motion.        General: No swelling.     Right lower leg: No edema.     Left lower leg: No edema.  Skin:    General: Skin is warm and dry.     Capillary Refill: Capillary refill takes less than 2 seconds.  Neurological:     General: No focal deficit present.     Mental Status: She is alert and oriented to person, place, and time.     Cranial Nerves: No cranial nerve deficit.     Sensory: No sensory deficit.  Psychiatric:        Mood and Affect: Mood normal.        Behavior: Behavior normal.        Thought Content: Thought content normal.        Judgment: Judgment normal.         Assessment And Plan:     1. Encounter for general adult medical examination w/o abnormal findings . Behavior modifications discussed and diet history reviewed.   . Pt will continue to exercise regularly and modify diet with low GI, plant based foods and decrease intake of processed foods.  . Recommend intake of daily multivitamin, Vitamin D, and calcium.  . Recommend mammogram for preventive screenings, as well as recommend immunizations that include influenza, TDAP - BMP8+Anion Gap - CBC no Diff - POCT Urinalysis Dipstick (81002)  2. Vitamin D deficiency  Will check vitamin D level and supplement as needed.     Also encouraged to spend 15  minutes in the sun daily.  - Vitamin D (25 hydroxy)  3. Screening for STD (sexually transmitted disease)  - HIV antibody (with reflex)  4. Encounter for immunization  Tetanus vaccine given today while in office. Refer to order management. TDAP will be administered to adults 80-107 years old every 10 years.  Pneumonia 23 vaccine given in office due to prediabetes and previous history of type 2 diabetes now diet and exercise controlled and prediabetic levels - Tdap (BOOSTRIX) injection 0.5 mL - Pneumococcal polysaccharide vaccine 23-valent greater than or equal to 2yo subcutaneous/IM  5. Prediabetes  Chronic, stable  No current medications  Encouraged to limit intake of sugary foods and drinks  Encouraged to increase physical activity to 150 minutes per week  I would expect this to improve she has lost approximately 50 lbs in the last year. - Hemoglobin A1c   6. Mixed hyperlipidemia  Chronic, controlled  No current medications - BMP8+Anion Gap - Lipid Profile   Minette Brine, FNP    THE PATIENT IS ENCOURAGED TO PRACTICE SOCIAL DISTANCING DUE TO THE COVID-19 PANDEMIC.

## 2018-10-09 LAB — BMP8+ANION GAP
Anion Gap: 12 mmol/L (ref 10.0–18.0)
BUN/Creatinine Ratio: 17 (ref 9–23)
BUN: 13 mg/dL (ref 6–24)
CO2: 27 mmol/L (ref 20–29)
Calcium: 9.9 mg/dL (ref 8.7–10.2)
Chloride: 101 mmol/L (ref 96–106)
Creatinine, Ser: 0.75 mg/dL (ref 0.57–1.00)
GFR calc Af Amer: 108 mL/min/{1.73_m2} (ref >59)
GFR calc non Af Amer: 94 mL/min/{1.73_m2} (ref >59)
Glucose: 83 mg/dL (ref 65–99)
Potassium: 4 mmol/L (ref 3.5–5.2)
Sodium: 140 mmol/L (ref 134–144)

## 2018-10-09 LAB — CBC
Hematocrit: 43.5 % (ref 34.0–46.6)
Hemoglobin: 13.8 g/dL (ref 11.1–15.9)
MCH: 26.6 pg (ref 26.6–33.0)
MCHC: 31.7 g/dL (ref 31.5–35.7)
MCV: 84 fL (ref 79–97)
Platelets: 223 10*3/uL (ref 150–450)
RBC: 5.19 x10E6/uL (ref 3.77–5.28)
RDW: 13.5 % (ref 11.7–15.4)
WBC: 3.9 10*3/uL (ref 3.4–10.8)

## 2018-10-09 LAB — HEMOGLOBIN A1C
Est. average glucose Bld gHb Est-mCnc: 117 mg/dL
Hgb A1c MFr Bld: 5.7 % — ABNORMAL HIGH (ref 4.8–5.6)

## 2018-10-09 LAB — LIPID PANEL
Chol/HDL Ratio: 4.1 ratio (ref 0.0–4.4)
Cholesterol, Total: 235 mg/dL — ABNORMAL HIGH (ref 100–199)
HDL: 57 mg/dL (ref >39)
LDL Calculated: 161 mg/dL — ABNORMAL HIGH (ref 0–99)
Triglycerides: 86 mg/dL (ref 0–149)
VLDL Cholesterol Cal: 17 mg/dL (ref 5–40)

## 2018-10-09 LAB — HIV ANTIBODY (ROUTINE TESTING W REFLEX): HIV Screen 4th Generation wRfx: NONREACTIVE

## 2018-10-09 LAB — VITAMIN D 25 HYDROXY (VIT D DEFICIENCY, FRACTURES): Vit D, 25-Hydroxy: 32.7 ng/mL (ref 30.0–100.0)

## 2018-10-10 LAB — URINE CYTOLOGY ANCILLARY ONLY
Chlamydia: NEGATIVE
Neisseria Gonorrhea: NEGATIVE
Trichomonas: NEGATIVE

## 2018-10-12 LAB — URINE CYTOLOGY ANCILLARY ONLY
Bacterial vaginitis: NEGATIVE
Candida vaginitis: NEGATIVE

## 2018-10-14 ENCOUNTER — Encounter: Payer: BC Managed Care – PPO | Admitting: Internal Medicine

## 2018-12-09 ENCOUNTER — Ambulatory Visit (INDEPENDENT_AMBULATORY_CARE_PROVIDER_SITE_OTHER): Payer: PRIVATE HEALTH INSURANCE | Admitting: Nurse Practitioner

## 2018-12-09 ENCOUNTER — Other Ambulatory Visit: Payer: Self-pay

## 2018-12-09 ENCOUNTER — Encounter: Payer: Self-pay | Admitting: Nurse Practitioner

## 2018-12-09 VITALS — BP 122/76 | HR 80 | Temp 98.6°F | Ht 66.8 in | Wt 217.8 lb

## 2018-12-09 DIAGNOSIS — Z23 Encounter for immunization: Secondary | ICD-10-CM | POA: Diagnosis not present

## 2018-12-09 DIAGNOSIS — R110 Nausea: Secondary | ICD-10-CM

## 2018-12-09 DIAGNOSIS — M542 Cervicalgia: Secondary | ICD-10-CM | POA: Diagnosis not present

## 2018-12-09 DIAGNOSIS — M62838 Other muscle spasm: Secondary | ICD-10-CM | POA: Diagnosis not present

## 2018-12-09 DIAGNOSIS — M25512 Pain in left shoulder: Secondary | ICD-10-CM

## 2018-12-09 DIAGNOSIS — G44319 Acute post-traumatic headache, not intractable: Secondary | ICD-10-CM

## 2018-12-09 MED ORDER — ONDANSETRON HCL 4 MG PO TABS: 4.0000 mg | ORAL_TABLET | Freq: Every day | ORAL | 1 refills | Status: DC | PRN

## 2018-12-09 MED ORDER — CYCLOBENZAPRINE HCL 10 MG PO TABS: 10.0000 mg | ORAL_TABLET | Freq: Three times a day (TID) | ORAL | 0 refills | Status: DC | PRN

## 2018-12-09 NOTE — Patient Instructions (Signed)
Concussion, Adult  A concussion is a brain injury from a hard, direct hit (trauma) to your head or body. This direct hit causes the brain to quickly shake back and forth inside the skull. A concussion may also be called a mild traumatic brain injury (TBI). Healing from this injury can take time. What are the causes? This condition is caused by:  A direct hit to your head, such as: ? Running into a player during a game. ? Being hit in a fight. ? Hitting your head on a hard surface.  A quick and sudden movement (jolt) of the head or neck, such as in a car crash. What are the signs or symptoms? The signs of a concussion can be hard to notice. They may be missed by you, family members, and doctors. You may look fine on the outside but may not act or feel normal. Physical symptoms  Headaches.  Being tired (fatigued).  Being dizzy.  Problems with body balance.  Problems seeing or hearing.  Being sensitive to light or noise.  Feeling sick to your stomach (nausea) or throwing up (vomiting).  Not sleeping or eating as you used to.  Loss of feeling (numbness) or tingling in the body.  Seizure. Mental and emotional symptoms  Problems remembering things.  Trouble focusing your mind (concentrating), organizing, or making decisions.  Being slow to think, act, react, speak, or read.  Feeling grouchy (irritable).  Having mood changes.  Feeling worried or nervous (anxious).  Feeling sad (depressed). How is this treated? This condition may be treated by:  Stopping sports or activity if you are injured. If you hit your head or have signs of concussion: ? Do not return to sports or activities the same day. ? Get checked by a doctor before you return to your activities.  Resting your body and your mind.  Being watched carefully, often at home.  Medicines to help with symptoms such as: ? Feeling sick to your stomach. ? Headaches. ? Problems with sleep.  Avoid taking strong  pain medicines (opioids) for a concussion.  Avoiding alcohol and drugs.  Being asked to go to a concussion clinic or a place to help you recover (rehabilitation center). Recovery from a concussion can take time. Return to activities only:  When you are fully healed.  When your doctor says it is safe. Follow these instructions at home: Activity  Limit activities that need a lot of thought or focus, such as: ? Homework or work for your job. ? Watching TV. ? Using the computer or phone. ? Playing memory games and puzzles.  Rest. Rest helps your brain heal. Make sure you: ? Get plenty of sleep. Most adults should get 7-9 hours of sleep each night. ? Rest during the day. Take naps or breaks when you feel tired.  Avoid activity like exercise until your doctor says its safe. Stop any activity that makes symptoms worse.  Do not do activities that could cause a second concussion, such as riding a bike or playing sports.  Ask your doctor when you can return to your normal activities, such as school, work, sports, and driving. Your ability to react may be slower. Do not do these activities if you are dizzy. General instructions   Take over-the-counter and prescription medicines only as told by your doctor.  Do not drink alcohol until your doctor says you can.  Watch your symptoms and tell other people to do the same. Other problems can occur after a concussion. Older   adults have a higher risk of serious problems. °· Tell your work manager, teachers, school nurse, school counselor, coach, or athletic trainer about your injury and symptoms. Tell them about what you can or cannot do. °· Keep all follow-up visits as told by your doctor. This is important. °How is this prevented? °· It is very important that you do not get another brain injury. In rare cases, another injury can cause brain damage that will not go away, brain swelling, or death. The risk of this is greatest in the first 7-10 days  after a head injury. To avoid injuries: °? Stop activities that could lead to a second concussion, such as contact sports, until your doctor says it is okay. °? When you return to sports or activities: °§ Do not crash into other players. This is how most concussions happen. °§ Follow the rules. °§ Respect other players. °? Get regular exercise. Do strength and balance training. °? Wear a helmet that fits you well during sports, biking, or other activities. °§ Helmets can help protect you from serious skull and brain injuries, but they do not protect you from a concussion. Even when wearing a helmet, you should avoid being hit in the head. °Contact a doctor if: °· Your symptoms get worse or they do not get better. °· You have new symptoms. °· You have another injury. °Get help right away if: °· You have bad headaches or your headaches get worse. °· You feel weak or numb in any part of your body. °· You are mixed up (confused). °· Your balance gets worse. °· You keep throwing up. °· You feel more sleepy than normal. °· Your speech is not clear (is slurred). °· You cannot recognize people or places. °· You have a seizure. °· Others have trouble waking you up. °· You have behavior changes. °· You have changes in how you see (vision). °· You pass out (lose consciousness). °Summary °· A concussion is a brain injury from a hard, direct hit (trauma) to your head or body. °· This condition is treated with rest and careful watching of symptoms. °· If you keep having symptoms, call your doctor. °This information is not intended to replace advice given to you by your health care provider. Make sure you discuss any questions you have with your health care provider. °Document Released: 01/25/2009 Document Revised: 09/27/2017 Document Reviewed: 09/27/2017 °Elsevier Patient Education © 2020 [employer] ° °
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
## 2018-12-12 ENCOUNTER — Encounter: Payer: Self-pay | Admitting: Nurse Practitioner

## 2018-12-18 ENCOUNTER — Encounter: Payer: Self-pay | Admitting: Nurse Practitioner

## 2018-12-24 ENCOUNTER — Encounter: Payer: Self-pay | Admitting: Nurse Practitioner

## 2018-12-24 ENCOUNTER — Ambulatory Visit (INDEPENDENT_AMBULATORY_CARE_PROVIDER_SITE_OTHER): Payer: Self-pay | Admitting: Nurse Practitioner

## 2018-12-24 ENCOUNTER — Other Ambulatory Visit: Payer: Self-pay

## 2018-12-24 VITALS — BP 120/86 | HR 98 | Temp 98.2°F | Ht 68.0 in | Wt 218.4 lb

## 2018-12-24 DIAGNOSIS — G44319 Acute post-traumatic headache, not intractable: Secondary | ICD-10-CM
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
## 2018-12-25 ENCOUNTER — Ambulatory Visit
Admission: RE | Admit: 2018-12-25 | Discharge: 2018-12-25 | Disposition: A | Discharge status: Home / Self Care | Payer: Self-pay | Source: Ambulatory Visit | Attending: Nurse Practitioner | Admitting: Nurse Practitioner

## 2018-12-25 DIAGNOSIS — G44319 Acute post-traumatic headache, not intractable: Secondary | ICD-10-CM

## 2019-01-01 ENCOUNTER — Telehealth: Payer: Self-pay

## 2019-01-01 NOTE — Telephone Encounter (Signed)
Pt advised to quarantine and go to [facility] for testing. Pt is scheduled for a virtual visit tomorrow. Pt stated that she was experiencing loss of taste.

## 2019-01-02 ENCOUNTER — Telehealth: Payer: Self-pay

## 2019-01-02 ENCOUNTER — Encounter: Payer: Self-pay | Admitting: Nurse Practitioner

## 2019-01-02 ENCOUNTER — Other Ambulatory Visit: Payer: Self-pay

## 2019-01-02 ENCOUNTER — Telehealth (INDEPENDENT_AMBULATORY_CARE_PROVIDER_SITE_OTHER): Payer: BC Managed Care – PPO | Admitting: Nurse Practitioner

## 2019-01-02 VITALS — Wt 218.0 lb

## 2019-01-02 DIAGNOSIS — R0981 Nasal congestion: Secondary | ICD-10-CM

## 2019-01-02 DIAGNOSIS — R05 Cough: Secondary | ICD-10-CM | POA: Diagnosis not present

## 2019-01-02 DIAGNOSIS — M791 Myalgia, unspecified site: Secondary | ICD-10-CM

## 2019-01-02 DIAGNOSIS — R432 Parageusia: Secondary | ICD-10-CM

## 2019-01-02 DIAGNOSIS — R6883 Chills (without fever): Secondary | ICD-10-CM

## 2019-01-02 NOTE — Telephone Encounter (Signed)
Patient called stating she went to the [facility] for covid testing and was told they would be doing testing from 12-3pm and when she went up there she was told they were only doing it for people having surgery. I advised her to go to the [facility] clinic at [facility]. [facility]

## 2019-01-02 NOTE — Progress Notes (Addendum)
Virtual Visit via Video   This visit type was conducted due to national recommendations for restrictions regarding the COVID-19 Pandemic (e.g. social distancing) in an effort to limit this patient's exposure and mitigate transmission in our community.  Due to her co-morbid illnesses, this patient is at least at moderate risk for complications without adequate follow up.  This format is felt to be most appropriate for this patient at this time.  All issues noted in this document were discussed and addressed.  A limited physical exam was performed with this format.    This visit type was conducted due to national recommendations for restrictions regarding the COVID-19 Pandemic (e.g. social distancing) in an effort to limit this patient's exposure and mitigate transmission in our community.  Patients identity confirmed using two different identifiers.  This format is felt to be most appropriate for this patient at this time.  All issues noted in this document were discussed and addressed.  No physical exam was performed (except for noted visual exam findings with Video Visits).    Date:  01/02/2019   ID:  [name] Age, DOB 1970-05-17, MRN [id number]  Patient Location:  Home - spoke with [name]  Provider location:   Office    Chief Complaint:  Loss of sense of taste  History of Present Illness:    [name] is a 48 y.o. female who presents via video conferencing for a telehealth visit today.    The patient does have symptoms concerning for COVID-19 infection (fever, chills, cough, or new shortness of breath).   She was in the bed sick this weekend with chills (Wednesday), Thursday and Friday chilled out.  On Friday and Saturday was in bed.  No fever. Had congestion, coughing, aches and chills.  Then her dad started coughing.  By Tuesday unable to taste. The coughing had subsided and congestion was better.  She also used vicks vaporub.  Now she is unable to taste  anything. She was also feeling a little fatigued.  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     Past Medical History:  Diagnosis Date  . Abdominal pain   . Allergy   . Allergy-induced asthma   . Angina 05/2011   NO HEART PROBLEM WENT TO ED 05/22/11 FOR CP STRESS TEST WAS NEGATIVE   . Ankle fracture   . Chronic back pain greater than 3 months duration 01/17/2011   injured lumbar and posterior cervical neck in fall   . Constipation   . DDD (degenerative disc disease), cervical   . Diverticulitis   . Gallbladder problem   . GERD (gastroesophageal reflux disease)   . H/O hiatal hernia   . Hyperlipidemia   . IBS (irritable bowel syndrome)   . Nausea   . Neuromuscular disorder ([place])    LUMBA BACK  TROUBLE   . Pre-diabetes   . Rash    UNDER BIL BREASTS  . Wrist fracture    Past Surgical History:  Procedure Laterality Date  . BREAST CYST EXCISION Left ~ 1983  . CHOLECYSTECTOMY  08/2010  . COLONOSCOPY  07/2010   Removal of 3 polyps  . DILATATION & CURETTAGE/HYSTEROSCOPY WITH MYOSURE N/A 09/03/2015   Procedure: DILATATION & CURETTAGE/HYSTEROSCOPY WITH MYOSURE;  Surgeon: Servando Salina, MD;  Location: [facility];  Service: Gynecology;  Laterality: N/A;  . FRACTURE SURGERY  06/12/11   patient denies  . HERNIA REPAIR  1997; 05/2011  umbilical corrected during tubal ligation; VHR w/mesh  . TUBAL LIGATION  12/1995  . VENTRAL HERNIA REPAIR  06/12/2011   Procedure: LAPAROSCOPIC VENTRAL HERNIA;  Surgeon: Gwenyth Ober, MD;  Location: [facility];  Service: General;  Laterality: N/A;  . WISDOM TOOTH EXTRACTION  1990's     Current Meds  Medication Sig  . acetaminophen-codeine (TYLENOL #3) 300-30 MG per tablet Take 1 tablet by mouth every 4 (four) hours as needed for moderate pain.   Marland Kitchen albuterol (VENTOLIN HFA) 108 (90 Base) MCG/ACT inhaler 2 puffs q 4 hours as neededFor shortness of breath  Ok to sub with proair (covered rx)  . cyclobenzaprine  (FLEXERIL) 10 MG tablet Take 1 tablet (10 mg total) by mouth 3 (three) times daily as needed for muscle spasms.  Marland Kitchen Fexofenadine HCl (ALLEGRA PO) Take 1 tablet by mouth daily.  Marland Kitchen linaclotide (LINZESS) 145 MCG CAPS capsule Take 145 mcg by mouth daily before breakfast.  . montelukast (SINGULAIR) 10 MG tablet Take 1 tablet (10 mg total) by mouth daily.  Marland Kitchen zinc gluconate 50 MG tablet Take 50 mg by mouth daily.     Allergies:   Amoxicillin, Sulfa antibiotics, Penicillins, Tape, and Tramadol   Social History   Tobacco Use  . Smoking status: Never Smoker  . Smokeless tobacco: Never Used  Substance Use Topics  . Alcohol use: Yes  . Drug use: No     Family Hx: The patient's family history includes Alcoholism in her father; Cancer in her father and mother; Diabetes in her brother, mother, and sister; Heart disease in her father and maternal grandmother; Hyperlipidemia in her brother, father, mother, and sister; Hypertension in her father; Kidney disease in her father; Obesity in her mother; Other in her father; Sleep apnea in her mother.  ROS:   Please see the history of present illness.    Review of Systems  Constitutional: Positive for malaise/fatigue.  HENT: Positive for congestion.   Respiratory: Negative.   Cardiovascular: Negative.   Neurological: Negative for dizziness and tingling.  Psychiatric/Behavioral: Negative.     All other systems reviewed and are negative.   Labs/Other Tests and Data Reviewed:    Recent Labs: 10/08/2018: BUN 13; Creatinine, Ser 0.75; Hemoglobin 13.8; Platelets 223; Potassium 4.0; Sodium 140   Recent Lipid Panel Lab Results  Component Value Date/Time   CHOL 235 (H) 10/08/2018 11:17 AM   TRIG 86 10/08/2018 11:17 AM   HDL 57 10/08/2018 11:17 AM   CHOLHDL 4.1 10/08/2018 11:17 AM   LDLCALC 161 (H) 10/08/2018 11:17 AM    Wt Readings from Last 3 Encounters:  01/02/19 218 lb (98.9 kg)  12/24/18 218 lb 6.4 oz (99.1 kg)  12/09/18 217 lb 12.8 oz (98.8  kg)     Exam:    Vital Signs:  Wt 218 lb (98.9 kg)   LMP 11/05/2015 Comment: tubal ligation  BMI 33.15 kg/m     Physical Exam  Constitutional: She is oriented to person, place, and time and well-developed, well-nourished, and in no distress. No distress.  Pulmonary/Chest: Effort normal. No respiratory distress.  Neurological: She is alert and oriented to person, place, and time.  Psychiatric: Mood, memory, affect and judgment normal.    ASSESSMENT & PLAN:    1. Loss of taste  Will send for covid testing due to her father being on dialysis and he lives with her  It has began improving - Novel Coronavirus, NAA ([facility])  2. Nasal congestion  3-4 day history of nasal congestion  Will check for coronavirus  May use flonase nasal spray if worsens - Novel Coronavirus, NAA ([facility])   COVID-19 Education: The signs and symptoms of COVID-19 were discussed with the patient and how to seek care for testing (follow up with PCP or arrange E-visit).  The importance of social distancing was discussed today.  Patient Risk:   After full review of this patients clinical status, I feel that they are at least moderate risk at this time.  Time:   Today, I have spent 10 minutes/ seconds with the patient with telehealth technology discussing above diagnoses.     Medication Adjustments/Labs and Tests Ordered: Current medicines are reviewed at length with the patient today.  Concerns regarding medicines are outlined above.   Tests Ordered: Orders Placed This Encounter  Procedures  . Novel Coronavirus, NAA ([facility])    Medication Changes: No orders of the defined types were placed in this encounter.   Disposition:  Follow up prn  Signed, Minette Brine, FNP

## 2019-01-06 ENCOUNTER — Telehealth: Payer: Self-pay

## 2019-01-06 NOTE — Telephone Encounter (Signed)
Call to check on pt since dx with covid. Pt is doing fine just loss of taste and smell. Pt encouraged to call the office if she needs anything.

## 2019-01-29 ENCOUNTER — Encounter: Payer: Self-pay | Admitting: Internal Medicine

## 2019-02-16 ENCOUNTER — Encounter: Payer: Self-pay | Admitting: Internal Medicine

## 2019-02-17 ENCOUNTER — Telehealth: Payer: Self-pay
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
## 2019-02-24 ENCOUNTER — Encounter: Payer: Self-pay | Admitting: Internal Medicine

## 2019-02-24 ENCOUNTER — Ambulatory Visit (INDEPENDENT_AMBULATORY_CARE_PROVIDER_SITE_OTHER): Payer: PRIVATE HEALTH INSURANCE | Admitting: Internal Medicine

## 2019-02-24 ENCOUNTER — Other Ambulatory Visit: Payer: Self-pay

## 2019-02-24 VITALS — BP 132/80 | HR 85 | Temp 98.5°F | Ht 68.0 in | Wt 218.6 lb

## 2019-02-24 DIAGNOSIS — M62838 Other muscle spasm: Secondary | ICD-10-CM

## 2019-02-24 DIAGNOSIS — M25512 Pain in left shoulder: Secondary | ICD-10-CM | POA: Diagnosis not present

## 2019-02-24 DIAGNOSIS — Z6833 Body mass index (BMI) 33.0-33.9, adult: Secondary | ICD-10-CM

## 2019-02-24 DIAGNOSIS — G8929 Other chronic pain: Secondary | ICD-10-CM | POA: Diagnosis not present

## 2019-02-24 DIAGNOSIS — E6609 Other obesity due to excess calories: Secondary | ICD-10-CM

## 2019-02-24 MED ORDER — CYCLOBENZAPRINE HCL 10 MG PO TABS: 10.0000 mg | ORAL_TABLET | Freq: Three times a day (TID) | ORAL | 0 refills | Status: DC | PRN
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
## 2019-02-24 NOTE — Patient Instructions (Signed)
Shoulder Pain Many things can cause shoulder pain, including:  An injury.  Moving the shoulder in the same way again and again (overuse).  Joint pain (arthritis). Pain can come from:  Swelling and irritation (inflammation) of any part of the shoulder.  An injury to the shoulder joint.  An injury to: ? Tissues that connect muscle to bone (tendons). ? Tissues that connect bones to each other (ligaments). ? Bones. Follow these instructions at home: Watch for changes in your symptoms. Let your doctor know about them. Follow these instructions to help with your pain. If you have a sling:  Wear the sling as told by your doctor. Remove it only as told by your doctor.  Loosen the sling if your fingers: ? Tingle. ? Become numb. ? Turn cold and blue.  Keep the sling clean.  If the sling is not waterproof: ? Do not let it get wet. ? Take the sling off when you shower or bathe. Managing pain, stiffness, and swelling   If told, put ice on the painful area: ? Put ice in a plastic bag. ? Place a towel between your skin and the bag. ? Leave the ice on for 20 minutes, 2-3 times a day. Stop putting ice on if it does not help with the pain.  Squeeze a soft ball or a foam pad as much as possible. This prevents swelling in the shoulder. It also helps to strengthen the arm. General instructions  Take over-the-counter and prescription medicines only as told by your doctor.  Keep all follow-up visits as told by your doctor. This is important. Contact a doctor if:  Your pain gets worse.  Medicine does not help your pain.  You have new pain in your arm, hand, or fingers. Get help right away if:  Your arm, hand, or fingers: ? Tingle. ? Are numb. ? Are swollen. ? Are painful. ? Turn white or blue. Summary  Shoulder pain can be caused by many things. These include injury, moving the shoulder in the same away again and again, and joint pain.  Watch for changes in your symptoms.  Let your doctor know about them.  This condition may be treated with a sling, ice, and pain medicine.  Contact your doctor if the pain gets worse or you have new pain. Get help right away if your arm, hand, or fingers tingle or get numb, swollen, or painful.  Keep all follow-up visits as told by your doctor. This is important. This information is not intended to replace advice given to you by your health care provider. Make sure you discuss any questions you have with your health care provider. Document Revised: 08/21/2017 Document Reviewed: 08/21/2017 Elsevier Patient Education  2020 Elsevier Inc.  

## 2019-02-25 ENCOUNTER — Encounter: Payer: Self-pay | Admitting: Internal Medicine

## 2019-02-27 ENCOUNTER — Other Ambulatory Visit: Payer: Self-pay | Admitting: Orthopedic Surgery

## 2019-02-27 DIAGNOSIS — M67912 Unspecified disorder of synovium and tendon, left shoulder: Secondary | ICD-10-CM

## 2019-02-27 DIAGNOSIS — M25512 Pain in left shoulder: Secondary | ICD-10-CM

## 2019-03-01 ENCOUNTER — Encounter: Payer: Self-pay | Admitting: Internal Medicine

## 2019-03-10 ENCOUNTER — Encounter: Payer: Self-pay | Admitting: Internal Medicine

## 2019-03-17 ENCOUNTER — Encounter: Payer: Self-pay | Admitting: Internal Medicine

## 2019-03-17 ENCOUNTER — Other Ambulatory Visit: Payer: BC Managed Care – PPO

## 2019-03-24 ENCOUNTER — Ambulatory Visit
Admission: RE | Admit: 2019-03-24 | Discharge: 2019-03-24 | Disposition: A | Discharge status: Home / Self Care | Payer: Self-pay | Source: Ambulatory Visit | Attending: Orthopedic Surgery | Admitting: Orthopedic Surgery

## 2019-03-24 ENCOUNTER — Other Ambulatory Visit: Payer: Self-pay

## 2019-03-24 DIAGNOSIS — M25512 Pain in left shoulder: Secondary | ICD-10-CM

## 2019-03-24 DIAGNOSIS — M67912 Unspecified disorder of synovium and tendon, left shoulder: Secondary | ICD-10-CM

## 2019-04-11 ENCOUNTER — Encounter: Payer: Self-pay | Admitting: Internal Medicine

## 2019-04-17 ENCOUNTER — Other Ambulatory Visit: Payer: Self-pay

## 2019-04-17 ENCOUNTER — Ambulatory Visit (INDEPENDENT_AMBULATORY_CARE_PROVIDER_SITE_OTHER): Payer: BC Managed Care – PPO | Admitting: Internal Medicine

## 2019-04-17 ENCOUNTER — Encounter: Payer: Self-pay | Admitting: Internal Medicine

## 2019-04-17 VITALS — BP 114/82 | HR 87 | Temp 97.8°F | Ht 68.0 in | Wt 208.0 lb

## 2019-04-17 DIAGNOSIS — E559 Vitamin D deficiency, unspecified: Secondary | ICD-10-CM | POA: Diagnosis not present

## 2019-04-17 DIAGNOSIS — Z6831 Body mass index (BMI) 31.0-31.9, adult: Secondary | ICD-10-CM

## 2019-04-17 DIAGNOSIS — E7800 Pure hypercholesterolemia, unspecified: Secondary | ICD-10-CM

## 2019-04-17 DIAGNOSIS — E6609 Other obesity due to excess calories: Secondary | ICD-10-CM | POA: Diagnosis not present

## 2019-04-17 DIAGNOSIS — R7309 Other abnormal glucose: Secondary | ICD-10-CM

## 2019-04-17 NOTE — Progress Notes (Signed)
This visit occurred during the SARS-CoV-2 public health emergency.  Safety protocols were in place, including screening questions prior to the visit, additional usage of staff PPE, and extensive cleaning of exam room while observing appropriate contact time as indicated for disinfecting solutions.  Subjective:     Patient ID: [name] , female    DOB: Jul 17, 1970 , 49 y.o.   MRN: [id number]   Chief Complaint  Patient presents with  . Hyperlipidemia  . abnormal glucose    HPI  She is here today for a cholesterol check.  She is no longer taking rosuvastatin. She has been following the program "Next 56 days". She is pleased with her progress. She reports losing at least 50 pounds over the past year.   Hyperlipidemia This is a chronic problem. Pertinent negatives include no chest pain, focal sensory loss, myalgias or shortness of breath. Current antihyperlipidemic treatment includes diet change. The current treatment provides moderate improvement of lipids.     Past Medical History:  Diagnosis Date  . Abdominal pain   . Allergy   . Allergy-induced asthma   . Angina 05/2011   NO HEART PROBLEM WENT TO ED 05/22/11 FOR CP STRESS TEST WAS NEGATIVE   . Ankle fracture   . Chronic back pain greater than 3 months duration 01/17/2011   injured lumbar and posterior cervical neck in fall   . Constipation   . DDD (degenerative disc disease), cervical   . Diverticulitis   . Gallbladder problem   . GERD (gastroesophageal reflux disease)   . H/O hiatal hernia   . Hyperlipidemia   . IBS (irritable bowel syndrome)   . Nausea   . Neuromuscular disorder ([place])    LUMBA BACK  TROUBLE   . Pre-diabetes   . Rash    UNDER BIL BREASTS  . Wrist fracture      Family History  Problem Relation Nicholson of Onset  . Diabetes Mother   . Hyperlipidemia Mother   . Cancer Mother   . Sleep apnea Mother   . Obesity Mother   . Heart disease Father   . Cancer Father        kidney and prostate  .  Hyperlipidemia Father   . Other Father        gout and clogged arteries  . Hypertension Father   . Kidney disease Father   . Alcoholism Father   . Hyperlipidemia Sister   . Diabetes Sister   . Diabetes Brother   . Hyperlipidemia Brother   . Heart disease Maternal Grandmother      Current Outpatient Medications:  .  acetaminophen-codeine (TYLENOL #3) 300-30 MG per tablet, Take 1 tablet by mouth every 4 (four) hours as needed for moderate pain. , Disp: , Rfl:  .  albuterol (VENTOLIN HFA) 108 (90 Base) MCG/ACT inhaler, 2 puffs q 4 hours as neededFor shortness of breath  Ok to sub with proair (covered rx), Disp: 18 g, Rfl: 1 .  B Complex-C (B COMPLEX-VITAMIN C PO), Take 1,000 mg by mouth. 1 per day, Disp: , Rfl:  .  Cholecalciferol (VITAMIN D3) 250 MCG (10000 UT) TABS, Take 1 capsule by mouth daily., Disp: , Rfl:  .  cyclobenzaprine (FLEXERIL) 10 MG tablet, Take 1 tablet (10 mg total) by mouth 3 (three) times daily as needed for muscle spasms., Disp: 30 tablet, Rfl: 0 .  ELDERBERRY PO, Take by mouth. 1 per day, Disp: , Rfl:  .  Fexofenadine HCl (ALLEGRA PO), Take 1 tablet by  mouth daily., Disp: , Rfl:  .  montelukast (SINGULAIR) 10 MG tablet, Take 1 tablet (10 mg total) by mouth daily., Disp: 90 tablet, Rfl: 1 .  NON FORMULARY, 2 capsules daily. Optimal - M Bone, nerves, muscles, Disp: , Rfl:  .  NON FORMULARY, 3 capsules daily. Rejuveniix Energy and Mental Alertness, Disp: , Rfl:  .  NON FORMULARY, 2 capsules at bedtime. Omega-Q Heart and Brain Function, Disp: , Rfl:  .  NON FORMULARY, 1 capsule. Vinali Heart, Eyes, Skin, Lungs, Disp: , Rfl:  .  NON FORMULARY, 3 capsules daily. Optimal-V Heart, Skin, Eyes, Lungs, Disp: , Rfl:  .  NON FORMULARY, 2 capsules. Porbiotiix, Disp: , Rfl:  .  NON FORMULARY, 4 capsules. Magnical-D Bones and Muscles., Disp: , Rfl:  .  Omega 3-6-9 Fatty Acids (OMEGA 3-6-9 COMPLEX PO), Take 1,600 mg by mouth daily., Disp: , Rfl:  .  zinc gluconate 50 MG tablet, Take  50 mg by mouth daily., Disp: , Rfl:  .  linaclotide (LINZESS) 145 MCG CAPS capsule, Take 145 mcg by mouth daily before breakfast., Disp: , Rfl:    Allergies  Allergen Reactions  . Amoxicillin Hives  . Sulfa Antibiotics Hives  . Penicillins Hives and Itching    Has patient had a PCN reaction causing immediate rash, facial/tongue/throat swelling, SOB or lightheadedness with hypotension: Yes Has patient had a PCN reaction causing severe rash involving mucus membranes or skin necrosis: No Has patient had a PCN reaction that required hospitalization No Has patient had a PCN reaction occurring within the last 10 years: Yes If all of the above answers are "NO", then may proceed with Cephalosporin use.   . Tape     PLASTIC TAPE   (WHELPS, TORE SKIN)  . Tramadol Hives     Review of Systems  Constitutional: Negative.   Respiratory: Negative.  Negative for shortness of breath.   Cardiovascular: Negative.  Negative for chest pain.  Gastrointestinal: Negative.   Musculoskeletal: Negative for myalgias.  Neurological: Negative.   Psychiatric/Behavioral: Negative.      Today's Vitals   04/17/19 0849  BP: 114/82  Pulse: 87  Temp: 97.8 F (36.6 C)  TempSrc: Oral  Weight: 208 lb (94.3 kg)  Height: 5\' 8"  (1.727 m)   Body mass index is 31.63 kg/m.   Wt Readings from Last 3 Encounters:  04/17/19 208 lb (94.3 kg)  02/24/19 218 lb 9.6 oz (99.2 kg)  01/02/19 218 lb (98.9 kg)     Objective:  Physical Exam Vitals and nursing note reviewed.  Constitutional:      Appearance: Normal appearance. She is obese.  HENT:     Head: Normocephalic and atraumatic.  Cardiovascular:     Rate and Rhythm: Normal rate and regular rhythm.     Heart sounds: Normal heart sounds.  Pulmonary:     Effort: Pulmonary effort is normal.     Breath sounds: Normal breath sounds.  Skin:    General: Skin is warm.  Neurological:     General: No focal deficit present.     Mental Status: She is alert.   Psychiatric:        Mood and Affect: Mood normal.        Behavior: Behavior normal.         Assessment And Plan:     1. Pure hypercholesterolemia  Chronic. She does not wish to take rx chol meds. I will check lipid panel and make further recommendations once her labs are available for  review. She was congratulated on her lifestyle changes she has made thus far.   - Lipid panel  2. Other abnormal glucose  HER A1C HAS BEEN ELEVATED IN THE PAST. I WILL CHECK AN A1C, BMET TODAY. SHE WAS ENCOURAGED TO AVOID SUGARY BEVERAGES AND PROCESSED FOODS INCLUDNG BREADS, RICE AND PASTA.  - Hemoglobin A1c  3. Vitamin D deficiency disease  I WILL CHECK A VIT D LEVEL AND SUPPLEMENT AS NEEDED.  ALSO ENCOURAGED TO SPEND 15 MINUTES IN THE SUN DAILY.  - Vitamin D (25 hydroxy)  4. Class 1 obesity due to excess calories with serious comorbidity and body mass index (BMI) of 31.0 to 31.9 in adult  She was congratulated on her weight loss thus far and encouraged to keep up the great work.     [name], MD    THE PATIENT IS ENCOURAGED TO PRACTICE SOCIAL DISTANCING DUE TO THE COVID-19 PANDEMIC.

## 2019-04-17 NOTE — Patient Instructions (Signed)

## 2019-04-18 LAB — HEMOGLOBIN A1C
Est. average glucose Bld gHb Est-mCnc: 114 mg/dL
Hgb A1c MFr Bld: 5.6 % (ref 4.8–5.6)

## 2019-04-18 LAB — LIPID PANEL
Chol/HDL Ratio: 3.9 ratio (ref 0.0–4.4)
Cholesterol, Total: 214 mg/dL — ABNORMAL HIGH (ref 100–199)
HDL: 55 mg/dL (ref >39)
LDL Chol Calc (NIH): 145 mg/dL — ABNORMAL HIGH (ref 0–99)
Triglycerides: 77 mg/dL (ref 0–149)
VLDL Cholesterol Cal: 14 mg/dL (ref 5–40)

## 2019-04-18 LAB — VITAMIN D 25 HYDROXY (VIT D DEFICIENCY, FRACTURES): Vit D, 25-Hydroxy: 44.3 ng/mL (ref 30.0–100.0)

## 2019-05-16 ENCOUNTER — Encounter: Payer: Self-pay | Admitting: Internal Medicine

## 2019-05-19 ENCOUNTER — Other Ambulatory Visit: Payer: Self-pay | Admitting: Internal Medicine

## 2019-05-19 MED ORDER — DIAZEPAM 2 MG PO TABS: ORAL_TABLET | ORAL | 0 refills | Status: DC

## 2019-05-21 ENCOUNTER — Other Ambulatory Visit: Payer: Self-pay

## 2019-06-16 ENCOUNTER — Other Ambulatory Visit: Payer: Self-pay

## 2019-06-16 ENCOUNTER — Ambulatory Visit
Admission: RE | Admit: 2019-06-16 | Discharge: 2019-06-16 | Disposition: A | Discharge status: Home / Self Care | Payer: BC Managed Care – PPO | Source: Ambulatory Visit | Attending: Orthopedic Surgery | Admitting: Orthopedic Surgery

## 2019-06-16 DIAGNOSIS — M25512 Pain in left shoulder: Secondary | ICD-10-CM

## 2019-06-16 DIAGNOSIS — M67912 Unspecified disorder of synovium and tendon, left shoulder: Secondary | ICD-10-CM

## 2019-06-30 ENCOUNTER — Ambulatory Visit: Payer: BC Managed Care – PPO | Attending: Internal Medicine

## 2019-06-30 DIAGNOSIS — Z23 Encounter for immunization: Secondary | ICD-10-CM

## 2019-06-30 NOTE — Progress Notes (Signed)
   Covid-19 Vaccination Clinic  Name:  [name]    MRN: [id number] DOB: [birth year]  06/30/2019  [name] was observed post Covid-19 immunization for 15 minutes without incident. She was provided with Vaccine Information Sheet and instruction to access the V-Safe system.   [name] was instructed to call 911 with any severe reactions post vaccine: Marland Kitchen Difficulty breathing  . Swelling of face and throat  . A fast heartbeat  . A bad rash all over body  . Dizziness and weakness   Immunizations Administered    Name Date Dose VIS Date Route   Pfizer COVID-19 Vaccine 06/30/2019  2:13 PM 0.3 mL 04/16/2018 Intramuscular   Manufacturer: [place]   Lot: [id number]   [place]: [id number]

## 2019-07-22 ENCOUNTER — Ambulatory Visit: Payer: BC Managed Care – PPO | Attending: Internal Medicine

## 2019-07-22 DIAGNOSIS — Z23 Encounter for immunization: Secondary | ICD-10-CM

## 2019-07-22 NOTE — Progress Notes (Signed)
   Covid-19 Vaccination Clinic  Name:  [name]    MRN: [id number] DOB: March 15, 1970  07/22/2019  [name] was observed post Covid-19 immunization for 15 minutes without incident. She was provided with Vaccine Information Sheet and instruction to access the V-Safe system.   [name] was instructed to call 911 with any severe reactions post vaccine: Marland Kitchen Difficulty breathing  . Swelling of face and throat  . A fast heartbeat  . A bad rash all over body  . Dizziness and weakness   Immunizations Administered    Name Date Dose VIS Date Route   Pfizer COVID-19 Vaccine 07/22/2019  8:23 AM 0.3 mL 04/16/2018 Intramuscular   Manufacturer: [employer], [employer]   Lot: [id number]   [place]: [id number]

## 2019-09-23 LAB — HEPATIC FUNCTION PANEL
ALT: 59 — AB (ref 7–35)
AST: 59 — AB (ref 13–35)
Bilirubin, Direct: 0.06 (ref 0.01–0.4)
Bilirubin, Total: 0.2

## 2019-09-23 LAB — CBC AND DIFFERENTIAL
HCT: 42 (ref 36–46)
Hemoglobin: 13.9 (ref 12.0–16.0)
Platelets: 236 (ref 150–399)
WBC: 4.6

## 2019-09-23 LAB — BASIC METABOLIC PANEL
BUN: 16 (ref 4–21)
CO2: 27 — AB (ref 13–22)
Chloride: 101 (ref 99–108)
Creatinine: 0.8 (ref 0.5–1.1)
Glucose: 76
Potassium: 4.1 (ref 3.4–5.3)
Sodium: 141 (ref 137–147)

## 2019-09-23 LAB — TSH: TSH: 1.85 (ref 0.41–5.90)

## 2019-09-23 LAB — COMPREHENSIVE METABOLIC PANEL
Albumin: 4.6 (ref 3.5–5.0)
Calcium: 10.2 (ref 8.7–10.7)
GFR calc Af Amer: 106
GFR calc non Af Amer: 92

## 2019-09-23 LAB — CBC: RBC: 5.26 — AB (ref 3.87–5.11)

## 2019-09-24 ENCOUNTER — Encounter: Payer: Self-pay | Admitting: Internal Medicine

## 2019-09-30 LAB — IRON,TIBC AND FERRITIN PANEL
Ferritin: 91
Iron: 66

## 2019-09-30 LAB — HEPATITIS B SURFACE ANTIGEN: Hepatitis B Surface Ag: NONREACTIVE

## 2019-09-30 LAB — HM HEPATITIS C SCREENING LAB: HM Hepatitis Screen: NEGATIVE

## 2019-10-01 ENCOUNTER — Encounter: Payer: Self-pay | Admitting: Internal Medicine

## 2019-10-13 ENCOUNTER — Encounter: Payer: BC Managed Care – PPO | Admitting: Internal Medicine

## 2019-10-15 ENCOUNTER — Encounter: Payer: Self-pay | Admitting: Internal Medicine

## 2019-10-16 ENCOUNTER — Encounter: Payer: Self-pay | Admitting: Internal Medicine

## 2019-10-16 ENCOUNTER — Other Ambulatory Visit: Payer: Self-pay

## 2019-10-16 ENCOUNTER — Ambulatory Visit: Payer: BC Managed Care – PPO | Admitting: Internal Medicine

## 2019-10-16 VITALS — BP 130/70 | HR 78 | Temp 98.0°F | Ht 66.8 in | Wt 203.4 lb

## 2019-10-16 DIAGNOSIS — Z0000 Encounter for general adult medical examination without abnormal findings: Secondary | ICD-10-CM | POA: Diagnosis not present

## 2019-10-16 DIAGNOSIS — Z6832 Body mass index (BMI) 32.0-32.9, adult: Secondary | ICD-10-CM

## 2019-10-16 DIAGNOSIS — M545 Low back pain: Secondary | ICD-10-CM

## 2019-10-16 DIAGNOSIS — E6609 Other obesity due to excess calories: Secondary | ICD-10-CM

## 2019-10-16 DIAGNOSIS — G8929 Other chronic pain: Secondary | ICD-10-CM

## 2019-10-16 DIAGNOSIS — R7989 Other specified abnormal findings of blood chemistry: Secondary | ICD-10-CM
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
## 2019-10-16 NOTE — Patient Instructions (Addendum)
thiamine   Health Maintenance, Female Adopting a healthy lifestyle and getting preventive care are important in promoting health and wellness. Ask your health care provider about:  The right schedule for you to have regular tests and exams.  Things you can do on your own to prevent diseases and keep yourself healthy. What should I know about diet, weight, and exercise? Eat a healthy diet   Eat a diet that includes plenty of vegetables, fruits, low-fat dairy products, and lean protein.  Do not eat a lot of foods that are high in solid fats, added sugars, or sodium. Maintain a healthy weight Body mass index (BMI) is used to identify weight problems. It estimates body fat based on height and weight. Your health care provider can help determine your BMI and help you achieve or maintain a healthy weight. Get regular exercise Get regular exercise. This is one of the most important things you can do for your health. Most adults should:  Exercise for at least 150 minutes each week. The exercise should increase your heart rate and make you sweat (moderate-intensity exercise).  Do strengthening exercises at least twice a week. This is in addition to the moderate-intensity exercise.  Spend less time sitting. Even light physical activity can be beneficial. Watch cholesterol and blood lipids Have your blood tested for lipids and cholesterol at 51 years of age, then have this test every 5 years. Have your cholesterol levels checked more often if:  Your lipid or cholesterol levels are high.  You are older than 51 years of age.  You are at high risk for heart disease. What should I know about cancer screening? Depending on your health history and family history, you may need to have cancer screening at various ages. This may include screening for:  Breast cancer.  Cervical cancer.  Colorectal cancer.  Skin cancer.  Lung cancer. What should I know about heart disease, diabetes, and high  blood pressure? Blood pressure and heart disease  High blood pressure causes heart disease and increases the risk of stroke. This is more likely to develop in people who have high blood pressure readings, are of African descent, or are overweight.  Have your blood pressure checked: ? Every 3-5 years if you are 61-60 years of age. ? Every year if you are 48 years old or older. Diabetes Have regular diabetes screenings. This checks your fasting blood sugar level. Have the screening done:  Once every three years after age 44 if you are at a normal weight and have a low risk for diabetes.  More often and at a younger age if you are overweight or have a high risk for diabetes. What should I know about preventing infection? Hepatitis B If you have a higher risk for hepatitis B, you should be screened for this virus. Talk with your health care provider to find out if you are at risk for hepatitis B infection. Hepatitis C Testing is recommended for:  Everyone born from [birth year] through 1967.  Anyone with known risk factors for hepatitis C. Sexually transmitted infections (STIs)  Get screened for STIs, including gonorrhea and chlamydia, if: ? You are sexually active and are younger than 51 years of age. ? You are older than 51 years of age and your health care provider tells you that you are at risk for this type of infection. ? Your sexual activity has changed since you were last screened, and you are at increased risk for chlamydia or gonorrhea. Ask your health  care provider if you are at risk.  Ask your health care provider about whether you are at high risk for HIV. Your health care provider may recommend a prescription medicine to help prevent HIV infection. If you choose to take medicine to prevent HIV, you should first get tested for HIV. You should then be tested every 3 months for as long as you are taking the medicine. Pregnancy  If you are about to stop having your period  (premenopausal) and you may become pregnant, seek counseling before you get pregnant.  Take 400 to 800 micrograms (mcg) of folic acid every day if you become pregnant.  Ask for birth control (contraception) if you want to prevent pregnancy. Osteoporosis and menopause Osteoporosis is a disease in which the bones lose minerals and strength with aging. This can result in bone fractures. If you are 24 years old or older, or if you are at risk for osteoporosis and fractures, ask your health care provider if you should:  Be screened for bone loss.  Take a calcium or vitamin D supplement to lower your risk of fractures.  Be given hormone replacement therapy (HRT) to treat symptoms of menopause. Follow these instructions at home: Lifestyle  Do not use any products that contain nicotine or tobacco, such as cigarettes, e-cigarettes, and chewing tobacco. If you need help quitting, ask your health care provider.  Do not use street drugs.  Do not share needles.  Ask your health care provider for help if you need support or information about quitting drugs. Alcohol use  Do not drink alcohol if: ? Your health care provider tells you not to drink. ? You are pregnant, may be pregnant, or are planning to become pregnant.  If you drink alcohol: ? Limit how much you use to 0-1 drink a day. ? Limit intake if you are breastfeeding.  Be aware of how much alcohol is in your drink. In the [place] one drink equals one 12 oz bottle of beer (355 mL), one 5 oz glass of wine (148 mL), or one 1 oz glass of hard liquor (44 mL). General instructions  Schedule regular health, dental, and eye exams.  Stay current with your vaccines.  Tell your health care provider if: ? You often feel depressed. ? You have ever been abused or do not feel safe at home. Summary  Adopting a healthy lifestyle and getting preventive care are important in promoting health and wellness.  Follow your health care provider's  instructions about healthy diet, exercising, and getting tested or screened for diseases.  Follow your health care provider's instructions on monitoring your cholesterol and blood pressure. This information is not intended to replace advice given to you by your health care provider. Make sure you discuss any questions you have with your health care provider. Document Revised: 01/30/2018 Document Reviewed: 01/30/2018 Elsevier Patient Education  2020 [employer].

## 2019-10-17 LAB — HEMOGLOBIN A1C
Est. average glucose Bld gHb Est-mCnc: 120 mg/dL
Hgb A1c MFr Bld: 5.8 % — ABNORMAL HIGH (ref 4.8–5.6)

## 2019-10-17 LAB — LIPID PANEL
Chol/HDL Ratio: 4.2 ratio (ref 0.0–4.4)
Cholesterol, Total: 250 mg/dL — ABNORMAL HIGH (ref 100–199)
HDL: 59 mg/dL (ref >39)
LDL Chol Calc (NIH): 179 mg/dL — ABNORMAL HIGH (ref 0–99)
Triglycerides: 71 mg/dL (ref 0–149)
VLDL Cholesterol Cal: 12 mg/dL (ref 5–40)

## 2019-10-17 LAB — HEPATIC FUNCTION PANEL
ALT: 14 IU/L (ref 0–32)
AST: 22 IU/L (ref 0–40)
Albumin: 4.6 g/dL (ref 3.8–4.8)
Alkaline Phosphatase: 55 IU/L (ref 48–121)
Bilirubin Total: 0.4 mg/dL (ref 0.0–1.2)
Bilirubin, Direct: 0.1 mg/dL (ref 0.00–0.40)
Total Protein: 7 g/dL (ref 6.0–8.5)

## 2019-10-17 LAB — GAMMA GT: GGT: 17 IU/L (ref 0–60)

## 2019-10-22 LAB — LIPID PANEL
Chol/HDL Ratio: 4.1 ratio (ref 0.0–4.4)
Cholesterol, Total: 240 mg/dL — ABNORMAL HIGH (ref 100–199)
HDL: 58 mg/dL (ref >39)
LDL Chol Calc (NIH): 170 mg/dL — ABNORMAL HIGH (ref 0–99)
Triglycerides: 72 mg/dL (ref 0–149)
VLDL Cholesterol Cal: 12 mg/dL (ref 5–40)

## 2019-10-22 LAB — HEMOGLOBIN A1C
Est. average glucose Bld gHb Est-mCnc: 120 mg/dL
Hgb A1c MFr Bld: 5.8 % — ABNORMAL HIGH (ref 4.8–5.6)

## 2019-10-22 LAB — SPECIMEN STATUS REPORT

## 2020-03-15 ENCOUNTER — Telehealth: Payer: Self-pay

## 2020-03-15 DIAGNOSIS — Z029 Encounter for administrative examinations, unspecified: Secondary | ICD-10-CM

## 2020-03-15 NOTE — Telephone Encounter (Signed)
ERROR

## 2020-04-02 DIAGNOSIS — B009 Herpesviral infection, unspecified: Secondary | ICD-10-CM | POA: Insufficient documentation

## 2020-04-02 DIAGNOSIS — E785 Hyperlipidemia, unspecified: Secondary | ICD-10-CM | POA: Insufficient documentation

## 2020-04-02 DIAGNOSIS — E119 Type 2 diabetes mellitus without complications: Secondary | ICD-10-CM | POA: Insufficient documentation

## 2020-04-02 DIAGNOSIS — K4090 Unilateral inguinal hernia, without obstruction or gangrene, not specified as recurrent: Secondary | ICD-10-CM | POA: Insufficient documentation

## 2020-04-02 DIAGNOSIS — Z9851 Tubal ligation status: Secondary | ICD-10-CM | POA: Insufficient documentation

## 2020-04-02 DIAGNOSIS — K5792 Diverticulitis of intestine, part unspecified, without perforation or abscess without bleeding: Secondary | ICD-10-CM | POA: Insufficient documentation

## 2020-04-28 ENCOUNTER — Ambulatory Visit: Payer: BC Managed Care – PPO | Admitting: Internal Medicine

## 2020-06-30 LAB — COMPREHENSIVE METABOLIC PANEL
Albumin: 4.8 (ref 3.5–5.0)
Calcium: 9.4 (ref 8.7–10.7)
GFR calc Af Amer: 108

## 2020-06-30 LAB — HEPATIC FUNCTION PANEL
ALT: 13 (ref 7–35)
AST: 18 (ref 13–35)
Alkaline Phosphatase: 59 (ref 25–125)
Bilirubin, Direct: 0.1 (ref 0.01–0.4)
Bilirubin, Total: 0.4

## 2020-06-30 LAB — BASIC METABOLIC PANEL
BUN: 14 (ref 4–21)
CO2: 28 — AB (ref 13–22)
Chloride: 103 (ref 99–108)
Creatinine: 0.6 (ref 0.5–1.1)
Glucose: 89
Potassium: 4.2 (ref 3.4–5.3)
Sodium: 143 (ref 137–147)

## 2020-06-30 LAB — CBC AND DIFFERENTIAL
HCT: 44 (ref 36–46)
Hemoglobin: 13.8 (ref 12.0–16.0)
Platelets: 211 (ref 150–399)
WBC: 3.2

## 2020-06-30 LAB — TSH: TSH: 1.34 (ref 0.41–5.90)

## 2020-06-30 LAB — CBC: RBC: 5.22 — AB (ref 3.87–5.11)

## 2020-07-15 ENCOUNTER — Encounter: Payer: Self-pay | Admitting: Nurse Practitioner

## 2020-07-15 ENCOUNTER — Ambulatory Visit (INDEPENDENT_AMBULATORY_CARE_PROVIDER_SITE_OTHER): Payer: BC Managed Care – PPO | Admitting: Nurse Practitioner

## 2020-07-15 ENCOUNTER — Other Ambulatory Visit: Payer: Self-pay

## 2020-07-15 DIAGNOSIS — R059 Cough, unspecified: Secondary | ICD-10-CM

## 2020-07-15 DIAGNOSIS — R0989 Other specified symptoms and signs involving the circulatory and respiratory systems: Secondary | ICD-10-CM | POA: Diagnosis not present

## 2020-07-15 DIAGNOSIS — R062 Wheezing: Secondary | ICD-10-CM

## 2020-07-15 LAB — POC INFLUENZA A&B (BINAX/QUICKVUE)
Influenza A, POC: NEGATIVE
Influenza B, POC: NEGATIVE

## 2020-07-15 LAB — POC COVID19 BINAXNOW

## 2020-07-15 MED ORDER — ALBUTEROL SULFATE HFA 108 (90 BASE) MCG/ACT IN AERS: INHALATION_SPRAY | RESPIRATORY_TRACT | 1 refills | Status: DC

## 2020-07-15 MED ORDER — GUAIFENESIN-DM 100-10 MG/5ML PO SYRP: 5.0000 mL | ORAL_SOLUTION | ORAL | 0 refills | Status: DC | PRN

## 2020-07-15 MED ORDER — AZITHROMYCIN 250 MG PO TABS: ORAL_TABLET | ORAL | 0 refills | Status: AC

## 2020-07-15 NOTE — Progress Notes (Signed)
I,[name],acting as a Education administrator for [employer], NP.,have documented all relevant documentation on the behalf of [employer], NP,as directed by  Tanya Castilla, NP while in the presence of Tanya Castilla, NP.  This visit occurred during the SARS-CoV-2 public health emergency.  Safety protocols were in place, including screening questions prior to the visit, additional usage of staff PPE, and extensive cleaning of exam room while observing appropriate contact time as indicated for disinfecting solutions.  Subjective:     Patient ID: [name] Age , female    DOB: [birth year] , 50 y.o.   MRN: [id number]   Chief Complaint  Patient presents with  . Cough    HPI  Patient is here for cough. Her symptoms started about a week ago. She has no fever. Has congestion in her chest. She has a history of bronchitis and allergies. She is taking sudafed OTC for symptom relief.  She is coughing up phelgn.  She does have asthma so she does wheeze at times with her allergies. She denies chest pain.  She is vaccinated but not had a the booster shot.     Past Medical History:  Diagnosis Date  . Abdominal pain   . Allergy   . Allergy-induced asthma   . Angina 05/2011   NO HEART PROBLEM WENT TO ED 05/22/11 FOR CP STRESS TEST WAS NEGATIVE   . Ankle fracture   . Chronic back pain greater than 3 months duration 01/17/2011   injured lumbar and posterior cervical neck in fall   . Constipation   . DDD (degenerative disc disease), cervical   . Diverticulitis   . Gallbladder problem   . GERD (gastroesophageal reflux disease)   . H/O hiatal hernia   . Hyperlipidemia   . IBS (irritable bowel syndrome)   . Nausea   . Neuromuscular disorder ([place])    LUMBA BACK  TROUBLE   . Pre-diabetes   . Rash    UNDER BIL BREASTS  . Wrist fracture      Family History  Problem Relation Age of Onset  . Diabetes Mother   . Hyperlipidemia Mother   . Cancer Mother   . Sleep apnea Mother   . Obesity  Mother   . Heart disease Father   . Cancer Father        kidney and prostate  . Hyperlipidemia Father   . Other Father        gout and clogged arteries  . Hypertension Father   . Kidney disease Father   . Alcoholism Father   . Renal Disease Father   . Hyperlipidemia Sister   . Diabetes Sister   . Diabetes Brother   . Hyperlipidemia Brother   . Heart disease Maternal Grandmother      Current Outpatient Medications:  .  acetaminophen-codeine (TYLENOL #3) 300-30 MG per tablet, Take 1 tablet by mouth every 4 (four) hours as needed for moderate pain., Disp: , Rfl:  .  azithromycin (ZITHROMAX Z-PAK) 250 MG tablet, Take 2 tablets (500 mg) on  Day 1,  followed by 1 tablet (250 mg) once daily on Days 2 through 5., Disp: 6 each, Rfl: 0 .  B Complex-C (B COMPLEX-VITAMIN C PO), Take 1,000 mg by mouth. 1 per day, Disp: , Rfl:  .  Cholecalciferol (VITAMIN D3) 250 MCG (10000 UT) TABS, Take 1 capsule by mouth daily., Disp: , Rfl:  .  cyclobenzaprine (FLEXERIL) 10 MG tablet, Take 1 tablet (10 mg total) by mouth 3 (three) times daily  as needed for muscle spasms., Disp: 30 tablet, Rfl: 0 .  ELDERBERRY PO, Take by mouth. 1 per day, Disp: , Rfl:  .  Fexofenadine HCl (ALLEGRA PO), Take 1 tablet by mouth daily., Disp: , Rfl:  .  guaiFENesin-dextromethorphan (ROBITUSSIN DM) 100-10 MG/5ML syrup, Take 5 mLs by mouth every 4 (four) hours as needed for cough., Disp: 118 mL, Rfl: 0 .  linaclotide (LINZESS) 145 MCG CAPS capsule, Take 145 mcg by mouth daily before breakfast., Disp: , Rfl:  .  montelukast (SINGULAIR) 10 MG tablet, Take 1 tablet (10 mg total) by mouth daily., Disp: 90 tablet, Rfl: 1 .  NON FORMULARY, 2 capsules daily. Optimal - M Bone, nerves, muscles, Disp: , Rfl:  .  NON FORMULARY, 3 capsules daily. Rejuveniix Energy and Mental Alertness, Disp: , Rfl:  .  NON FORMULARY, 2 capsules at bedtime. Omega-Q Heart and Brain Function, Disp: , Rfl:  .  NON FORMULARY, 1 capsule. Vinali Heart, Eyes, Skin,  Lungs, Disp: , Rfl:  .  NON FORMULARY, 2 capsules. Porbiotiix, Disp: , Rfl:  .  zinc gluconate 50 MG tablet, Take 50 mg by mouth daily., Disp: , Rfl:  .  albuterol (VENTOLIN HFA) 108 (90 Base) MCG/ACT inhaler, 2 puffs q 4 hours as neededFor shortness of breath  Ok to sub with proair (covered rx), Disp: 18 g, Rfl: 1   Allergies  Allergen Reactions  . Amoxicillin Hives  . Sulfa Antibiotics Hives  . Penicillins Hives and Itching    Has patient had a PCN reaction causing immediate rash, facial/tongue/throat swelling, SOB or lightheadedness with hypotension: Yes Has patient had a PCN reaction causing severe rash involving mucus membranes or skin necrosis: No Has patient had a PCN reaction that required hospitalization No Has patient had a PCN reaction occurring within the last 10 years: Yes If all of the above answers are "NO", then may proceed with Cephalosporin use.   . Tape     PLASTIC TAPE   (WHELPS, TORE SKIN)  . Tramadol Hives     Review of Systems  Constitutional: Negative for chills and fever.  HENT: Positive for congestion and postnasal drip. Negative for sinus pressure.   Respiratory: Positive for cough.   Cardiovascular: Negative for chest pain and palpitations.  Musculoskeletal: Negative for arthralgias.  Neurological: Negative for dizziness, weakness and headaches.     Today's Vitals   There is no height or weight on file to calculate BMI.   Objective:  Physical Exam Constitutional:      Appearance: Normal appearance.  HENT:     Head: Normocephalic and atraumatic.     Nose: Congestion and rhinorrhea present.  Eyes:     Extraocular Movements: Extraocular movements intact.     Pupils: Pupils are equal, round, and reactive to light.  Cardiovascular:     Rate and Rhythm: Normal rate.     Pulses: Normal pulses.     Heart sounds: No murmur heard.   Pulmonary:     Effort: Pulmonary effort is normal. No respiratory distress.     Breath sounds: Normal breath sounds.  No wheezing.  Skin:    Capillary Refill: Capillary refill takes less than 2 seconds.  Neurological:     Mental Status: She is alert and oriented to person, place, and time.         Assessment And Plan:     1. Cough - POC COVID-19 - POC Influenza A&B ([facility] test) - Novel Coronavirus, NAA ([facility]) - azithromycin (ZITHROMAX Z-PAK) 250  MG tablet; Take 2 tablets (500 mg) on  Day 1,  followed by 1 tablet (250 mg) once daily on Days 2 through 5.  Dispense: 6 each; Refill: 0 - guaiFENesin-dextromethorphan (ROBITUSSIN DM) 100-10 MG/5ML syrup; Take 5 mLs by mouth every 4 (four) hours as needed for cough.  Dispense: 118 mL; Refill: 0  2. Chest congestion - azithromycin (ZITHROMAX Z-PAK) 250 MG tablet; Take 2 tablets (500 mg) on  Day 1,  followed by 1 tablet (250 mg) once daily on Days 2 through 5.  Dispense: 6 each; Refill: 0  3. Wheezing - albuterol (VENTOLIN HFA) 108 (90 Base) MCG/ACT inhaler; 2 puffs q 4 hours as neededFor shortness of breath  Ok to sub with proair (covered rx)  Dispense: 18 g; Refill: 1   The patient was encouraged to call or send a message through [place] for any questions or concerns.   Side effects and appropriate use of all the medication(s) were discussed with the patient today. Patient advised to use the medication(s) as directed by their healthcare provider. The patient was encouraged to read, review, and understand all associated package inserts and contact our office with any questions or concerns. The patient accepts the risks of the treatment plan and had an opportunity to ask questions.   Advised patient to take Vitamin C, D, Zinc. Keep yourself hydrated with a lot of water and rest. Take Delsym for cough and Mucinex. Take Tylenol or pain reliever every 4-6 hours as needed for pain/fever/body ache. Can take OTC allergy antihistimes.  Educated patient if symptoms get worse or if she experiences any SOB or pain in her legs to seek immediate emergency care. Continue  to     Patient was given opportunity to ask questions. Patient verbalized understanding of the plan and was able to repeat key elements of the plan. All questions were answered to their satisfaction.  Raman Tonja Jezewski, DNP   I, Raman Anola Mcgough have reviewed all documentation for this visit. The documentation on 07/15/20 for the exam, diagnosis, procedures, and orders are all accurate and complete.     IF YOU HAVE BEEN REFERRED TO A SPECIALIST, IT MAY TAKE 1-2 WEEKS TO SCHEDULE/PROCESS THE REFERRAL. IF YOU HAVE NOT HEARD FROM US/SPECIALIST IN TWO WEEKS, PLEASE GIVE [place] A CALL AT [phone] X [phone].   THE PATIENT IS ENCOURAGED TO PRACTICE SOCIAL DISTANCING DUE TO THE COVID-19 PANDEMIC.

## 2020-07-16 LAB — NOVEL CORONAVIRUS, NAA: SARS-CoV-2, NAA: NOT DETECTED

## 2020-07-16 LAB — SARS-COV-2, NAA 2 DAY TAT

## 2020-07-26 ENCOUNTER — Encounter: Payer: Self-pay | Admitting: Internal Medicine

## 2020-10-13 ENCOUNTER — Ambulatory Visit: Payer: BC Managed Care – PPO | Admitting: Nurse Practitioner

## 2020-10-13 ENCOUNTER — Other Ambulatory Visit: Payer: Self-pay

## 2020-10-13 ENCOUNTER — Encounter: Payer: Self-pay | Admitting: Nurse Practitioner

## 2020-10-13 VITALS — BP 122/90 | HR 85 | Temp 98.2°F | Ht 68.0 in | Wt 231.2 lb

## 2020-10-13 DIAGNOSIS — F4321 Adjustment disorder with depressed mood: Secondary | ICD-10-CM | POA: Diagnosis not present

## 2020-10-13 DIAGNOSIS — E6609 Other obesity due to excess calories: Secondary | ICD-10-CM

## 2020-10-13 DIAGNOSIS — F439 Reaction to severe stress, unspecified: Secondary | ICD-10-CM | POA: Diagnosis not present

## 2020-10-13 DIAGNOSIS — Z6835 Body mass index (BMI) 35.0-35.9, adult: Secondary | ICD-10-CM

## 2020-10-13 DIAGNOSIS — R030 Elevated blood-pressure reading, without diagnosis of hypertension: Secondary | ICD-10-CM | POA: Diagnosis not present

## 2020-10-13 NOTE — Progress Notes (Signed)
I,Yamilka [employer] as a Education administrator for [employer], NP.,have documented all relevant documentation on the behalf of [employer], NP,as directed by  Bary Castilla, NP while in the presence of Bary Castilla, NP.  This visit occurred during the SARS-CoV-2 public health emergency.  Safety protocols were in place, including screening questions prior to the visit, additional usage of staff PPE, and extensive cleaning of exam room while observing appropriate contact time as indicated for disinfecting solutions.  Subjective:     Patient ID: [name] , female    DOB: Nov 13, 1970 , 50 y.o.   MRN: [id number]   Chief Complaint  Patient presents with   Headache   HPI  Patient stated her blood pressure was 165/105 and yesterday it was 144/85 before therapy. Patient's father recently passed away and she admits to not taking care of herself. She stated she has not taken any of her meds. She does have family, friends and church. She wants to take some time off from work intermittently work.  She is doing grief counseling weekly via zoom.  She is requesting 2 weeks off from her work. She will bring in her paperwork and go from there in regards to taking more time off.   Headache  This is a new problem. The current episode started in the past 7 days. The problem occurs constantly. The problem has been gradually worsening. The pain is located in the Left unilateral region. The pain does not radiate. The quality of the pain is described as throbbing. The pain is moderate. Pertinent negatives include no coughing, dizziness, fever, phonophobia, rhinorrhea or weakness. Nothing aggravates the symptoms. She has tried acetaminophen for the symptoms. The treatment provided no relief.    Past Medical History:  Diagnosis Date   Abdominal pain    Allergy    Allergy-induced asthma    Angina 05/2011   NO HEART PROBLEM WENT TO ED 05/22/11 FOR CP STRESS TEST WAS NEGATIVE    Ankle fracture     Chronic back pain greater than 3 months duration 01/17/2011   injured lumbar and posterior cervical neck in fall    Constipation    DDD (degenerative disc disease), cervical    Diverticulitis    Gallbladder problem    GERD (gastroesophageal reflux disease)    H/O hiatal hernia    Hyperlipidemia    IBS (irritable bowel syndrome)    Nausea    Neuromuscular disorder (HCC)    LUMBA BACK  TROUBLE    Pre-diabetes    Rash    UNDER BIL BREASTS   Wrist fracture      Family History  Problem Relation Nicholson of Onset   Diabetes Mother    Hyperlipidemia Mother    Cancer Mother    Sleep apnea Mother    Obesity Mother    Heart disease Father    Cancer Father        kidney and prostate   Hyperlipidemia Father    Other Father        gout and clogged arteries   Hypertension Father    Kidney disease Father    Alcoholism Father    Renal Disease Father    Hyperlipidemia Sister    Diabetes Sister    Diabetes Brother    Hyperlipidemia Brother    Heart disease Maternal Grandmother      Current Outpatient Medications:    albuterol (VENTOLIN HFA) 108 (90 Base) MCG/ACT inhaler, 2 puffs q 4 hours as neededFor shortness of breath Ok  to sub with proair (covered rx), Disp: 18 g, Rfl: 1   B Complex-C (B COMPLEX-VITAMIN C PO), Take 1,000 mg by mouth. 1 per day (Patient not taking: Reported on 10/13/2020), Disp: , Rfl:    Cholecalciferol (VITAMIN D3) 250 MCG (10000 UT) TABS, Take 1 capsule by mouth daily. (Patient not taking: Reported on 10/13/2020), Disp: , Rfl:    cyclobenzaprine (FLEXERIL) 10 MG tablet, Take 1 tablet (10 mg total) by mouth 3 (three) times daily as needed for muscle spasms. (Patient not taking: Reported on 10/13/2020), Disp: 30 tablet, Rfl: 0   ELDERBERRY PO, Take by mouth. 1 per day (Patient not taking: Reported on 10/13/2020), Disp: , Rfl:    Fexofenadine HCl (ALLEGRA PO), Take 1 tablet by mouth daily. (Patient not taking: Reported on 10/13/2020), Disp: , Rfl:     guaiFENesin-dextromethorphan (ROBITUSSIN DM) 100-10 MG/5ML syrup, Take 5 mLs by mouth every 4 (four) hours as needed for cough. (Patient not taking: Reported on 10/13/2020), Disp: 118 mL, Rfl: 0   linaclotide (LINZESS) 145 MCG CAPS capsule, Take 145 mcg by mouth daily before breakfast. (Patient not taking: Reported on 10/13/2020), Disp: , Rfl:    montelukast (SINGULAIR) 10 MG tablet, Take 1 tablet (10 mg total) by mouth daily. (Patient not taking: Reported on 10/13/2020), Disp: 90 tablet, Rfl: 1   NON FORMULARY, 2 capsules daily. Optimal - M Bone, nerves, muscles (Patient not taking: Reported on 10/13/2020), Disp: , Rfl:    NON FORMULARY, 3 capsules daily. Rejuveniix Energy and Mental Alertness (Patient not taking: Reported on 10/13/2020), Disp: , Rfl:    NON FORMULARY, 2 capsules at bedtime. Omega-Q Heart and Brain Function (Patient not taking: Reported on 10/13/2020), Disp: , Rfl:    NON FORMULARY, 1 capsule. Vinali Heart, Eyes, Skin, Lungs (Patient not taking: Reported on 10/13/2020), Disp: , Rfl:    NON FORMULARY, 2 capsules. Porbiotiix (Patient not taking: Reported on 10/13/2020), Disp: , Rfl:    zinc gluconate 50 MG tablet, Take 50 mg by mouth daily. (Patient not taking: Reported on 10/13/2020), Disp: , Rfl:    Allergies  Allergen Reactions   Amoxicillin Hives   Sulfa Antibiotics Hives   Penicillins Hives and Itching    Has patient had a PCN reaction causing immediate rash, facial/tongue/throat swelling, SOB or lightheadedness with hypotension: Yes Has patient had a PCN reaction causing severe rash involving mucus membranes or skin necrosis: No Has patient had a PCN reaction that required hospitalization No Has patient had a PCN reaction occurring within the last 10 years: Yes If all of the above answers are "NO", then may proceed with Cephalosporin use.    Tape     PLASTIC TAPE   (WHELPS, TORE SKIN)   Tramadol Hives     Review of Systems  Constitutional:  Negative for chills and fever.   HENT:  Negative for congestion and rhinorrhea.   Eyes:  Negative for visual disturbance.  Respiratory:  Negative for cough, choking, chest tightness, shortness of breath and wheezing.   Cardiovascular:  Negative for chest pain and palpitations.  Gastrointestinal:  Negative for abdominal distention, constipation and diarrhea.  Endocrine: Negative for polydipsia, polyphagia and polyuria.  Musculoskeletal:  Negative for arthralgias and myalgias.  Neurological:  Positive for headaches. Negative for dizziness and weakness.    Today's Vitals   10/13/20 0856  BP: 122/90  Pulse: 85  Temp: 98.2 F (36.8 C)  Weight: 231 lb 3.2 oz (104.9 kg)  Height: '5\' 8"'$  (1.727 m)  PainSc: 8  PainLoc: Head   Body mass index is 35.15 kg/m.  Wt Readings from Last 3 Encounters:  10/13/20 231 lb 3.2 oz (104.9 kg)  10/16/19 203 lb 6.4 oz (92.3 kg)  04/17/19 208 lb (94.3 kg)     Objective:  Physical Exam Vitals and nursing note reviewed.  Constitutional:      Appearance: Normal appearance.  HENT:     Head: Normocephalic and atraumatic.     Right Ear: Tympanic membrane, ear canal and external ear normal.     Left Ear: Tympanic membrane, ear canal and external ear normal.     Nose: Nose normal.  Eyes:     Conjunctiva/sclera: Conjunctivae normal.  Cardiovascular:     Rate and Rhythm: Normal rate and regular rhythm.     Pulses: Normal pulses.     Heart sounds: Normal heart sounds. No murmur heard. Pulmonary:     Effort: Pulmonary effort is normal. No respiratory distress.     Breath sounds: Normal breath sounds. No wheezing.  Abdominal:     General: Abdomen is flat.  Genitourinary:    Comments: deferred Neurological:     General: No focal deficit present.     Mental Status: She is alert and oriented to person, place, and time.  Psychiatric:        Mood and Affect: Mood normal.        Behavior: Behavior normal.        Assessment And Plan:     1. Elevated blood pressure reading in office  without diagnosis of hypertension -In office BP was 122/90. Pt. Not will to start medication at this time. Will keep a log of her BP for her visit in 2 weeks.  -Advised patient to limit her salt intake and processed foods.  -Advised patient to increase her physical activity.  -Limit the intake of processed foods and salt intake. You should increase your intake of green vegetables and fruits. Limit the use of alcohol. Limit fast foods and fried foods. Avoid high fatty saturated and trans fat foods. Keep yourself hydrated with drinking water. Avoid red meats. Eat lean meats instead. Exercise for atleast 30-45 min for atleast 4-5 times a week.   2. Unresolved grief -Pt has had unresolved long term grief from the death of her father and mother. She is currently getting counseling through the hospice center where her father was. Is still wanting a referral to a therapist.  - Ambulatory referral to Psychology  3. Stress -Pt. Experiencing with stress from her personal life and work life.  -Advised patient to continue therapy with hospice center and use relaxation methods such as exercise and massage to help with stress management. Encouraged her to start exercising.  - Ambulatory referral to Psychology  4. Class 2 obesity due to excess calories with body mass index (BMI) of 35.0 to 35.9 in adult, unspecified whether serious comorbidity present  -Advised patient on a healthy diet including avoiding fast food and red meats. Increase the intake of lean meats including grilled chicken and [place].  Drink a lot of water. Decrease intake of fatty foods. Exercise for 30-45 min. 4-5 a week to decrease the risk of cardiac event.   The patient was encouraged to call or send a message through [facility] for any questions or concerns.   Follow up: if symptoms persist or do not get better.   Side effects and appropriate use of all the medication(s) were discussed with the patient today. Patient advised to use the  medication(s)  as directed by their healthcare provider. The patient was encouraged to read, review, and understand all associated package inserts and contact our office with any questions or concerns. The patient accepts the risks of the treatment plan and had an opportunity to ask questions.   Staying healthy and adopting a healthy lifestyle for your overall health is important. You should eat 7 or more servings of fruits and vegetables per day. You should drink plenty of water to keep yourself hydrated and your kidneys healthy. This includes about 65-80+ fluid ounces of water. Limit your intake of animal fats especially for elevated cholesterol. Avoid highly processed food and limit your salt intake if you have hypertension. Avoid foods high in saturated/Trans fats. Along with a healthy diet it is also very important to maintain time for yourself to maintain a healthy mental health with low stress levels. You should get atleast 150 min of moderate intensity exercise weekly for a healthy heart. Along with eating right and exercising, aim for at least 7-9 hours of sleep daily.  Eat more whole grains which includes barley, wheat berries, oats, brown rice and whole wheat pasta. Use healthy plant oils which include olive, soy, corn, sunflower and peanut. Limit your caffeine and sugary drinks. Limit your intake of fast foods. Limit milk and dairy products to one or two daily servings.   Patient was given opportunity to ask questions. Patient verbalized understanding of the plan and was able to repeat key elements of the plan. All questions were answered to their satisfaction.  Raman Jasmen Emrich, DNP   I, Raman Avie [employer] have reviewed all documentation for this visit. The documentation on 10/13/20 for the exam, diagnosis, procedures, and orders are all accurate and complete.    IF YOU HAVE BEEN REFERRED TO A SPECIALIST, IT MAY TAKE 1-2 WEEKS TO SCHEDULE/PROCESS THE REFERRAL. IF YOU HAVE NOT HEARD FROM US/SPECIALIST IN  TWO WEEKS, PLEASE GIVE [place] A CALL AT [phone] X [phone].   THE PATIENT IS ENCOURAGED TO PRACTICE SOCIAL DISTANCING DUE TO THE COVID-19 PANDEMIC.

## 2020-10-13 NOTE — Patient Instructions (Signed)
Exercising to Lose Weight Exercise is structured, repetitive physical activity to improve fitness and health. Getting regular exercise is important for everyone. It is especially important if you are overweight. Being overweight increases your risk of heart disease, stroke, diabetes, high blood pressure, and several types of cancer.Reducing your calorie intake and exercising can help you lose weight. Exercise is usually categorized as moderate or vigorous intensity. To lose weight, most people need to do a certain amount of moderate-intensity orvigorous-intensity exercise each week. Moderate-intensity exercise  Moderate-intensity exercise is any activity that gets you moving enough to burn at least three times more energy (calories) than if you were sitting. Examples of moderate exercise include: Walking a mile in 15 minutes. Doing light yard work. Biking at an easy pace. Most people should get at least 150 minutes (2 hours and 30 minutes) a week ofmoderate-intensity exercise to maintain their body weight. Vigorous-intensity exercise Vigorous-intensity exercise is any activity that gets you moving enough to burn at least six times more calories than if you were sitting. When you exercise at this intensity, you should be working hard enough that you are not able tocarry on a conversation. Examples of vigorous exercise include: Running. Playing a team sport, such as football, basketball, and soccer. Jumping rope. Most people should get at least 75 minutes (1 hour and 15 minutes) a week ofvigorous-intensity exercise to maintain their body weight. How can exercise affect me? When you exercise enough to burn more calories than you eat, you lose weight. Exercise also reduces body fat and builds muscle. The more muscle you have, the more calories you burn. Exercise also: Improves mood. Reduces stress and tension. Improves your overall fitness, flexibility, and endurance. Increases bone strength. The  amount of exercise you need to lose weight depends on: Your age. The type of exercise. Any health conditions you have. Your overall physical ability. Talk to your health care provider about how much exercise you need and whattypes of activities are safe for you. What actions can I take to lose weight? Nutrition  Make changes to your diet as told by your health care provider or diet and nutrition specialist (dietitian). This may include: Eating fewer calories. Eating more protein. Eating less unhealthy fats. Eating a diet that includes fresh fruits and vegetables, whole grains, low-fat dairy products, and lean protein. Avoiding foods with added fat, salt, and sugar. Drink plenty of water while you exercise to prevent dehydration or heat stroke.  Activity Choose an activity that you enjoy and set realistic goals. Your health care provider can help you make an exercise plan that works for you. Exercise at a moderate or vigorous intensity most days of the week. The intensity of exercise may vary from person to person. You can tell how intense a workout is for you by paying attention to your breathing and heartbeat. Most people will notice their breathing and heartbeat get faster with more intense exercise. Do resistance training twice each week, such as: Push-ups. Sit-ups. Lifting weights. Using resistance bands. Getting short amounts of exercise can be just as helpful as long structured periods of exercise. If you have trouble finding time to exercise, try to include exercise in your daily routine. Get up, stretch, and walk around every 30 minutes throughout the day. Go for a walk during your lunch break. Park your car farther away from your destination. If you take public transportation, get off one stop early and walk the rest of the way. Make phone calls while standing up and   walking around. Take the stairs instead of elevators or escalators. Wear comfortable clothes and shoes with  good support. Do not exercise so much that you hurt yourself, feel dizzy, or get very short of breath. Where to find more information [place] Department of Health and Human Services: www.hhs.gov Centers for Disease Control and Prevention (CDC): www.cdc.gov Contact a health care provider: Before starting a new exercise program. If you have questions or concerns about your weight. If you have a medical problem that keeps you from exercising. Get help right away if you have any of the following while exercising: Injury. Dizziness. Difficulty breathing or shortness of breath that does not go away when you stop exercising. Chest pain. Rapid heartbeat. Summary Being overweight increases your risk of heart disease, stroke, diabetes, high blood pressure, and several types of cancer. Losing weight happens when you burn more calories than you eat. Reducing the amount of calories you eat in addition to getting regular moderate or vigorous exercise each week helps you lose weight. This information is not intended to replace advice given to you by your health care provider. Make sure you discuss any questions you have with your healthcare provider. Document Revised: 05/19/2019 Document Reviewed: 06/05/2019 Elsevier Patient Education  2022 Elsevier Inc.  

## 2020-10-26 ENCOUNTER — Encounter: Payer: Self-pay | Admitting: Internal Medicine

## 2020-10-26 ENCOUNTER — Other Ambulatory Visit: Payer: Self-pay

## 2020-10-26 ENCOUNTER — Ambulatory Visit (INDEPENDENT_AMBULATORY_CARE_PROVIDER_SITE_OTHER): Payer: BC Managed Care – PPO | Admitting: Internal Medicine

## 2020-10-26 VITALS — BP 124/88 | HR 75 | Temp 98.4°F | Ht 68.8 in | Wt 234.8 lb

## 2020-10-26 DIAGNOSIS — M62838 Other muscle spasm: Secondary | ICD-10-CM | POA: Diagnosis not present

## 2020-10-26 DIAGNOSIS — G8929 Other chronic pain: Secondary | ICD-10-CM

## 2020-10-26 DIAGNOSIS — Z2821 Immunization not carried out because of patient refusal: Secondary | ICD-10-CM

## 2020-10-26 DIAGNOSIS — E559 Vitamin D deficiency, unspecified: Secondary | ICD-10-CM

## 2020-10-26 DIAGNOSIS — R030 Elevated blood-pressure reading, without diagnosis of hypertension: Secondary | ICD-10-CM | POA: Diagnosis not present

## 2020-10-26 DIAGNOSIS — M545 Low back pain, unspecified: Secondary | ICD-10-CM

## 2020-10-26 DIAGNOSIS — Z0000 Encounter for general adult medical examination without abnormal findings: Secondary | ICD-10-CM | POA: Diagnosis not present

## 2020-10-26 DIAGNOSIS — F4321 Adjustment disorder with depressed mood: Secondary | ICD-10-CM

## 2020-10-26 DIAGNOSIS — Z23 Encounter for immunization: Secondary | ICD-10-CM

## 2020-10-26 LAB — POCT URINALYSIS DIPSTICK
Bilirubin, UA: NEGATIVE
Blood, UA: NEGATIVE
Glucose, UA: NEGATIVE
Ketones, UA: NEGATIVE
Leukocytes, UA: NEGATIVE
Nitrite, UA: NEGATIVE
Protein, UA: NEGATIVE
Spec Grav, UA: 1.025 (ref 1.010–1.025)
Urobilinogen, UA: 0.2 E.U./dL
pH, UA: 5.5 (ref 5.0–8.0)

## 2020-10-26 MED ORDER — ZOSTER VAC RECOMB ADJUVANTED 50 MCG/0.5ML IM SUSR: 0.5000 mL | Freq: Once | INTRAMUSCULAR | 0 refills | Status: AC

## 2020-10-26 NOTE — Patient Instructions (Signed)
Health Maintenance, Female Adopting a healthy lifestyle and getting preventive care are important in promoting health and wellness. Ask your health care provider about: The right schedule for you to have regular tests and exams. Things you can do on your own to prevent diseases and keep yourself healthy. What should I know about diet, weight, and exercise? Eat a healthy diet  Eat a diet that includes plenty of vegetables, fruits, low-fat dairy products, and lean protein. Do not eat a lot of foods that are high in solid fats, added sugars, or sodium. Maintain a healthy weight Body mass index (BMI) is used to identify weight problems. It estimates body fat based on height and weight. Your health care provider can help determine your BMI and help you achieve or maintain a healthy weight. Get regular exercise Get regular exercise. This is one of the most important things you can do for your health. Most adults should: Exercise for at least 150 minutes each week. The exercise should increase your heart rate and make you sweat (moderate-intensity exercise). Do strengthening exercises at least twice a week. This is in addition to the moderate-intensity exercise. Spend less time sitting. Even light physical activity can be beneficial. Watch cholesterol and blood lipids Have your blood tested for lipids and cholesterol at 52 years of age, then have this test every 5 years. Have your cholesterol levels checked more often if: Your lipid or cholesterol levels are high. You are older than 52 years of age. You are at high risk for heart disease. What should I know about cancer screening? Depending on your health history and family history, you may need to have cancer screening at various ages. This may include screening for: Breast cancer. Cervical cancer. Colorectal cancer. Skin cancer. Lung cancer. What should I know about heart disease, diabetes, and high blood pressure? Blood pressure and heart  disease High blood pressure causes heart disease and increases the risk of stroke. This is more likely to develop in people who have high blood pressure readings, are of African descent, or are overweight. Have your blood pressure checked: Every 3-5 years if you are 18-39 years of age. Every year if you are 40 years old or older. Diabetes Have regular diabetes screenings. This checks your fasting blood sugar level. Have the screening done: Once every three years after age 40 if you are at a normal weight and have a low risk for diabetes. More often and at a younger age if you are overweight or have a high risk for diabetes. What should I know about preventing infection? Hepatitis B If you have a higher risk for hepatitis B, you should be screened for this virus. Talk with your health care provider to find out if you are at risk for hepatitis B infection. Hepatitis C Testing is recommended for: Everyone born from 1945 through 1965. Anyone with known risk factors for hepatitis C. Sexually transmitted infections (STIs) Get screened for STIs, including gonorrhea and chlamydia, if: You are sexually active and are younger than 52 years of age. You are older than 52 years of age and your health care provider tells you that you are at risk for this type of infection. Your sexual activity has changed since you were last screened, and you are at increased risk for chlamydia or gonorrhea. Ask your health care provider if you are at risk. Ask your health care provider about whether you are at high risk for HIV. Your health care provider may recommend a prescription medicine   to help prevent HIV infection. If you choose to take medicine to prevent HIV, you should first get tested for HIV. You should then be tested every 3 months for as long as you are taking the medicine. Pregnancy If you are about to stop having your period (premenopausal) and you may become pregnant, seek counseling before you get  pregnant. Take 400 to 800 micrograms (mcg) of folic acid every day if you become pregnant. Ask for birth control (contraception) if you want to prevent pregnancy. Osteoporosis and menopause Osteoporosis is a disease in which the bones lose minerals and strength with aging. This can result in bone fractures. If you are 65 years old or older, or if you are at risk for osteoporosis and fractures, ask your health care provider if you should: Be screened for bone loss. Take a calcium or vitamin D supplement to lower your risk of fractures. Be given hormone replacement therapy (HRT) to treat symptoms of menopause. Follow these instructions at home: Lifestyle Do not use any products that contain nicotine or tobacco, such as cigarettes, e-cigarettes, and chewing tobacco. If you need help quitting, ask your health care provider. Do not use street drugs. Do not share needles. Ask your health care provider for help if you need support or information about quitting drugs. Alcohol use Do not drink alcohol if: Your health care provider tells you not to drink. You are pregnant, may be pregnant, or are planning to become pregnant. If you drink alcohol: Limit how much you use to 0-1 drink a day. Limit intake if you are breastfeeding. Be aware of how much alcohol is in your drink. In the [place] one drink equals one 12 oz bottle of beer (355 mL), one 5 oz glass of wine (148 mL), or one 1 oz glass of hard liquor (44 mL). General instructions Schedule regular health, dental, and eye exams. Stay current with your vaccines. Tell your health care provider if: You often feel depressed. You have ever been abused or do not feel safe at home. Summary Adopting a healthy lifestyle and getting preventive care are important in promoting health and wellness. Follow your health care provider's instructions about healthy diet, exercising, and getting tested or screened for diseases. Follow your health care provider's  instructions on monitoring your cholesterol and blood pressure. This information is not intended to replace advice given to you by your health care provider. Make sure you discuss any questions you have with your health care provider. Document Revised: 04/16/2020 Document Reviewed: 01/30/2018 Elsevier Patient Education  2022 Elsevier Inc.  

## 2020-10-26 NOTE — Progress Notes (Signed)
I,[name],acting as a Education administrator for [name], MD.,have documented all relevant documentation on the behalf of [name], MD,as directed by  [name], MD while in the presence of [name], MD.   This visit occurred during the SARS-CoV-2 public health emergency.  Safety protocols were in place, including screening questions prior to the visit, additional usage of staff PPE, and extensive cleaning of exam room while observing appropriate contact time as indicated for disinfecting solutions.  Subjective:     Patient ID: [name] Age , female    DOB: 30-Nov-1970 , 50 y.o.   MRN: [id number]   Chief Complaint  Patient presents with   Annual Exam    HPI  Patient is here for physical. She is followed by Dr Garwin Brothers as her GYN. She receives pain management care from Dr Nelva Bush.  Unfortunately, since her last visit, her father has passed. He lived with her and she was his primary caregiver. She is working through her grief now.     Past Medical History:  Diagnosis Date   Abdominal pain    Allergy    Allergy-induced asthma    Angina 05/2011   NO HEART PROBLEM WENT TO ED 05/22/11 FOR CP STRESS TEST WAS NEGATIVE    Ankle fracture    Chronic back pain greater than 3 months duration 01/17/2011   injured lumbar and posterior cervical neck in fall    Constipation    DDD (degenerative disc disease), cervical    Diverticulitis    Gallbladder problem    GERD (gastroesophageal reflux disease)    H/O hiatal hernia    Hyperlipidemia    IBS (irritable bowel syndrome)    Nausea    Neuromuscular disorder (HCC)    LUMBA BACK  TROUBLE    Pre-diabetes    Rash    UNDER BIL BREASTS   Wrist fracture      Family History  Problem Relation Age of Onset   Diabetes Mother    Hyperlipidemia Mother    Cancer Mother    Sleep apnea Mother    Obesity Mother    Heart disease Father    Cancer Father        kidney and prostate   Hyperlipidemia Father    Other Father         gout and clogged arteries   Hypertension Father    Kidney disease Father    Alcoholism Father    Renal Disease Father    Hyperlipidemia Sister    Diabetes Sister    Diabetes Brother    Hyperlipidemia Brother    Heart disease Maternal Grandmother      Current Outpatient Medications:    albuterol (VENTOLIN HFA) 108 (90 Base) MCG/ACT inhaler, 2 puffs q 4 hours as neededFor shortness of breath Ok to sub with proair (covered rx), Disp: 18 g, Rfl: 1   B Complex-C (B COMPLEX-VITAMIN C PO), Take 1,000 mg by mouth. 1 per day (Patient not taking: Reported on 10/13/2020), Disp: , Rfl:    Cholecalciferol (VITAMIN D3) 250 MCG (10000 UT) TABS, Take 1 capsule by mouth daily. (Patient not taking: Reported on 10/13/2020), Disp: , Rfl:    cyclobenzaprine (FLEXERIL) 10 MG tablet, Take 1 tablet (10 mg total) by mouth 3 (three) times daily as needed for muscle spasms. (Patient not taking: Reported on 10/13/2020), Disp: 30 tablet, Rfl: 0   ELDERBERRY PO, Take by mouth. 1 per day (Patient not taking: Reported on 10/13/2020), Disp: , Rfl:  Fexofenadine HCl (ALLEGRA PO), Take 1 tablet by mouth daily. (Patient not taking: Reported on 10/13/2020), Disp: , Rfl:    linaclotide (LINZESS) 145 MCG CAPS capsule, Take 145 mcg by mouth daily before breakfast. (Patient not taking: Reported on 10/13/2020), Disp: , Rfl:    montelukast (SINGULAIR) 10 MG tablet, Take 1 tablet (10 mg total) by mouth daily. (Patient not taking: Reported on 10/13/2020), Disp: 90 tablet, Rfl: 1   NON FORMULARY, 2 capsules daily. Optimal - M Bone, nerves, muscles (Patient not taking: Reported on 10/13/2020), Disp: , Rfl:    NON FORMULARY, 3 capsules daily. Rejuveniix Energy and Mental Alertness (Patient not taking: Reported on 10/13/2020), Disp: , Rfl:    NON FORMULARY, 2 capsules at bedtime. Omega-Q Heart and Brain Function (Patient not taking: Reported on 10/13/2020), Disp: , Rfl:    NON FORMULARY, 1 capsule. Vinali Heart, Eyes, Skin, Lungs (Patient not  taking: Reported on 10/13/2020), Disp: , Rfl:    NON FORMULARY, 2 capsules. Porbiotiix (Patient not taking: Reported on 10/13/2020), Disp: , Rfl:    zinc gluconate 50 MG tablet, Take 50 mg by mouth daily. (Patient not taking: Reported on 10/13/2020), Disp: , Rfl:    Allergies  Allergen Reactions   Amoxicillin Hives   Sulfa Antibiotics Hives   Penicillins Hives and Itching    Has patient had a PCN reaction causing immediate rash, facial/tongue/throat swelling, SOB or lightheadedness with hypotension: Yes Has patient had a PCN reaction causing severe rash involving mucus membranes or skin necrosis: No Has patient had a PCN reaction that required hospitalization No Has patient had a PCN reaction occurring within the last 10 years: Yes If all of the above answers are "NO", then may proceed with Cephalosporin use.    Tape     PLASTIC TAPE   (WHELPS, TORE SKIN)   Tramadol Hives      The patient states she uses none for birth control. Last LMP was Patient's last menstrual period was 11/05/2015.. Negative for Dysmenorrhea. Negative for: breast discharge, breast lump(s), breast pain and breast self exam. Associated symptoms include abnormal vaginal bleeding. Pertinent negatives include abnormal bleeding (hematology), anxiety, decreased libido, depression, difficulty falling sleep, dyspareunia, history of infertility, nocturia, sexual dysfunction, sleep disturbances, urinary incontinence, urinary urgency, vaginal discharge and vaginal itching. Diet regular.The patient states her exercise level is  intermittent. She plans to get on track in the near future.   . The patient's tobacco use is:  Social History   Tobacco Use  Smoking Status Never  Smokeless Tobacco Never  . She has been exposed to passive smoke. The patient's alcohol use is:  Social History   Substance and Sexual Activity  Alcohol Use Yes   Review of Systems  Constitutional: Negative.   HENT: Negative.    Eyes: Negative.    Respiratory: Negative.    Cardiovascular: Negative.   Gastrointestinal: Negative.   Endocrine: Negative.   Genitourinary: Negative.   Musculoskeletal:  Positive for arthralgias, back pain and neck pain.  Skin: Negative.   Allergic/Immunologic: Negative.   Neurological: Negative.   Hematological: Negative.   Psychiatric/Behavioral: Negative.      Today's Vitals   10/26/20 0920  BP: 124/88  Pulse: 75  Temp: 98.4 F (36.9 C)  Weight: 234 lb 12.8 oz (106.5 kg)  Height: 5' 8.8" (1.748 m)  PainSc: 0-No pain   Body mass index is 34.88 kg/m.   Objective:  Physical Exam Vitals and nursing note reviewed.  Constitutional:      Appearance: Normal  appearance. She is obese.  HENT:     Head: Normocephalic and atraumatic.     Right Ear: Tympanic membrane, ear canal and external ear normal.     Left Ear: Tympanic membrane, ear canal and external ear normal.     Nose:     Comments: Masked     Mouth/Throat:     Comments: Masked  Eyes:     Extraocular Movements: Extraocular movements intact.     Conjunctiva/sclera: Conjunctivae normal.     Pupils: Pupils are equal, round, and reactive to light.  Cardiovascular:     Rate and Rhythm: Normal rate and regular rhythm.     Pulses: Normal pulses.     Heart sounds: Normal heart sounds.  Pulmonary:     Effort: Pulmonary effort is normal.     Breath sounds: Normal breath sounds.  Abdominal:     General: Bowel sounds are normal.     Palpations: Abdomen is soft.  Genitourinary:    Comments: deferred Musculoskeletal:        General: Tenderness present. Normal range of motion.     Cervical back: Normal range of motion and neck supple. Tenderness present.  Lymphadenopathy:     Cervical: No cervical adenopathy.  Skin:    General: Skin is warm and dry.  Neurological:     General: No focal deficit present.     Mental Status: She is alert and oriented to person, place, and time.  Psychiatric:        Mood and Affect: Mood normal.         Behavior: Behavior normal.        Assessment And Plan:     1. Encounter for general adult medical examination w/o abnormal findings Comments: A full exam was performed. Importance of monthly self breast exams was discussed with the patient. PATIENT IS ADVISED TO GET 30-45 MINUTES REGULAR EXERCISE NO LESS THAN FOUR TO FIVE DAYS PER WEEK - BOTH WEIGHTBEARING EXERCISES AND AEROBIC ARE RECOMMENDED.  PATIENT IS ADVISED TO FOLLOW A HEALTHY DIET WITH AT LEAST SIX FRUITS/VEGGIES PER DAY, DECREASE INTAKE OF RED MEAT, AND TO INCREASE FISH INTAKE TO TWO DAYS PER WEEK.  MEATS/FISH SHOULD NOT BE FRIED, BAKED OR BROILED IS PREFERABLE.  IT IS ALSO IMPORTANT TO CUT BACK ON YOUR SUGAR INTAKE. PLEASE AVOID ANYTHING WITH ADDED SUGAR, CORN SYRUP OR OTHER SWEETENERS. IF YOU MUST USE A SWEETENER, YOU CAN TRY STEVIA. IT IS ALSO IMPORTANT TO AVOID ARTIFICIALLY SWEETENERS AND DIET BEVERAGES. LASTLY, I SUGGEST WEARING SPF 50 SUNSCREEN ON EXPOSED PARTS AND ESPECIALLY WHEN IN THE DIRECT SUNLIGHT FOR AN EXTENDED PERIOD OF TIME.  PLEASE AVOID FAST FOOD RESTAURANTS AND INCREASE YOUR WATER INTAKE.  - CMP14+EGFR - CBC - Hemoglobin A1c - Lipid panel - POCT Urinalysis Dipstick (81002)  2. Chronic bilateral low back pain without sciatica Comments: Chronic, followed by Dr. Nelva Bush for pain mgmt. I will also complete FMLA paperwork as requested.   3. Muscle spasms of neck Comments: Please see above. Encouraged to take magnesium nightly. Would likely benefit from Chiro/ART therapy.   4. Elevated blood pressure reading in office without diagnosis of hypertension Comments: She is encouraged to decrease intake of processed meats including bacon, sausage, deli meats and packaged foods which tend to be high in sodium. She agrees to rto in 74month for re-evaluation.   5. Vitamin D deficiency disease Comments: I will check a vitamin D level and supplement as needed.  - VITAMIN D 25 Hydroxy (Vit-D Deficiency, Fractures)  6.  Grief Comments: She is scheduled for grief counseling tomorrow.   7. Immunization due Comments: I will send rx Shingrix to her local pharmacy.  - Zoster Vaccine Adjuvanted [facility]) injection; Inject 0.5 mLs into the muscle once for 1 dose.  Dispense: 0.5 mL; Refill: 0  8. Influenza vaccination declined  Patient was given opportunity to ask questions. Patient verbalized understanding of the plan and was able to repeat key elements of the plan. All questions were answered to their satisfaction.   I, [name], MD, have reviewed all documentation for this visit. The documentation on 10/31/20 for the exam, diagnosis, procedures, and orders are all accurate and complete.   THE PATIENT IS ENCOURAGED TO PRACTICE SOCIAL DISTANCING DUE TO THE COVID-19 PANDEMIC.

## 2020-10-27 ENCOUNTER — Ambulatory Visit (INDEPENDENT_AMBULATORY_CARE_PROVIDER_SITE_OTHER): Payer: BC Managed Care – PPO | Admitting: Psychology

## 2020-10-27 ENCOUNTER — Encounter: Payer: Self-pay | Admitting: Internal Medicine

## 2020-10-27 DIAGNOSIS — F4322 Adjustment disorder with anxiety: Secondary | ICD-10-CM | POA: Diagnosis not present

## 2020-10-27 LAB — CMP14+EGFR
ALT: 25 IU/L (ref 0–32)
AST: 21 IU/L (ref 0–40)
Albumin/Globulin Ratio: 1.8 (ref 1.2–2.2)
Albumin: 4.8 g/dL (ref 3.8–4.9)
Alkaline Phosphatase: 74 IU/L (ref 44–121)
BUN/Creatinine Ratio: 19 (ref 9–23)
BUN: 14 mg/dL (ref 6–24)
Bilirubin Total: 0.3 mg/dL (ref 0.0–1.2)
CO2: 24 mmol/L (ref 20–29)
Calcium: 9.7 mg/dL (ref 8.7–10.2)
Chloride: 98 mmol/L (ref 96–106)
Creatinine, Ser: 0.73 mg/dL (ref 0.57–1.00)
Globulin, Total: 2.7 g/dL (ref 1.5–4.5)
Glucose: 92 mg/dL (ref 65–99)
Potassium: 4.8 mmol/L (ref 3.5–5.2)
Sodium: 138 mmol/L (ref 134–144)
Total Protein: 7.5 g/dL (ref 6.0–8.5)
eGFR: 100 mL/min/{1.73_m2} (ref >59)

## 2020-10-27 LAB — HEMOGLOBIN A1C
Est. average glucose Bld gHb Est-mCnc: 128 mg/dL
Hgb A1c MFr Bld: 6.1 % — ABNORMAL HIGH (ref 4.8–5.6)

## 2020-10-27 LAB — VITAMIN D 25 HYDROXY (VIT D DEFICIENCY, FRACTURES): Vit D, 25-Hydroxy: 28.3 ng/mL — ABNORMAL LOW (ref 30.0–100.0)

## 2020-10-27 LAB — LIPID PANEL
Chol/HDL Ratio: 4.7 ratio — ABNORMAL HIGH (ref 0.0–4.4)
Cholesterol, Total: 247 mg/dL — ABNORMAL HIGH (ref 100–199)
HDL: 53 mg/dL (ref >39)
LDL Chol Calc (NIH): 170 mg/dL — ABNORMAL HIGH (ref 0–99)
Triglycerides: 135 mg/dL (ref 0–149)
VLDL Cholesterol Cal: 24 mg/dL (ref 5–40)

## 2020-10-27 LAB — CBC
Hematocrit: 44.9 % (ref 34.0–46.6)
Hemoglobin: 14.4 g/dL (ref 11.1–15.9)
MCH: 26.7 pg (ref 26.6–33.0)
MCHC: 32.1 g/dL (ref 31.5–35.7)
MCV: 83 fL (ref 79–97)
Platelets: 213 10*3/uL (ref 150–450)
RBC: 5.39 x10E6/uL — ABNORMAL HIGH (ref 3.77–5.28)
RDW: 13.1 % (ref 11.7–15.4)
WBC: 3.8 10*3/uL (ref 3.4–10.8)

## 2020-11-03 ENCOUNTER — Ambulatory Visit (INDEPENDENT_AMBULATORY_CARE_PROVIDER_SITE_OTHER): Payer: BC Managed Care – PPO | Admitting: Psychology

## 2020-11-03 DIAGNOSIS — F4322 Adjustment disorder with anxiety: Secondary | ICD-10-CM

## 2020-11-04 ENCOUNTER — Telehealth: Payer: Self-pay

## 2020-11-04 NOTE — Telephone Encounter (Signed)
I called pt to notify her that her forms have been completed she stated she will come pick up a copy in the morning. Copy placed in Dr.Sanders bin up front. YL,RMA

## 2020-11-10 ENCOUNTER — Encounter: Payer: Self-pay | Admitting: Internal Medicine

## 2020-11-11 ENCOUNTER — Ambulatory Visit (INDEPENDENT_AMBULATORY_CARE_PROVIDER_SITE_OTHER): Payer: BC Managed Care – PPO | Admitting: Psychology

## 2020-11-11 DIAGNOSIS — F4322 Adjustment disorder with anxiety: Secondary | ICD-10-CM

## 2020-11-16 ENCOUNTER — Ambulatory Visit (INDEPENDENT_AMBULATORY_CARE_PROVIDER_SITE_OTHER): Payer: BC Managed Care – PPO | Admitting: Psychology

## 2020-11-16 DIAGNOSIS — F4322 Adjustment disorder with anxiety: Secondary | ICD-10-CM

## 2020-11-25 ENCOUNTER — Ambulatory Visit (INDEPENDENT_AMBULATORY_CARE_PROVIDER_SITE_OTHER): Payer: BC Managed Care – PPO | Admitting: Psychology

## 2020-11-25 DIAGNOSIS — F4322 Adjustment disorder with anxiety: Secondary | ICD-10-CM

## 2020-12-16 ENCOUNTER — Ambulatory Visit (INDEPENDENT_AMBULATORY_CARE_PROVIDER_SITE_OTHER): Payer: BC Managed Care – PPO | Admitting: Psychology

## 2020-12-16 DIAGNOSIS — F4322 Adjustment disorder with anxiety: Secondary | ICD-10-CM

## 2020-12-28 ENCOUNTER — Encounter: Payer: Self-pay | Admitting: Internal Medicine

## 2020-12-29 ENCOUNTER — Ambulatory Visit: Payer: BC Managed Care – PPO | Admitting: Psychology

## 2021-03-08 ENCOUNTER — Ambulatory Visit: Payer: BC Managed Care – PPO | Admitting: Internal Medicine

## 2021-04-04 ENCOUNTER — Encounter: Payer: Self-pay | Admitting: Internal Medicine

## 2021-04-04 ENCOUNTER — Ambulatory Visit: Payer: BC Managed Care – PPO | Admitting: Internal Medicine

## 2021-04-04 ENCOUNTER — Other Ambulatory Visit: Payer: Self-pay

## 2021-04-04 VITALS — BP 120/78 | HR 97 | Temp 98.7°F | Ht 68.8 in | Wt 250.4 lb

## 2021-04-04 DIAGNOSIS — E7800 Pure hypercholesterolemia, unspecified: Secondary | ICD-10-CM

## 2021-04-04 DIAGNOSIS — Z23 Encounter for immunization: Secondary | ICD-10-CM

## 2021-04-04 DIAGNOSIS — R0789 Other chest pain: Secondary | ICD-10-CM | POA: Diagnosis not present

## 2021-04-04 DIAGNOSIS — E6609 Other obesity due to excess calories: Secondary | ICD-10-CM

## 2021-04-04 DIAGNOSIS — Z8249 Family history of ischemic heart disease and other diseases of the circulatory system: Secondary | ICD-10-CM

## 2021-04-04 DIAGNOSIS — Z6837 Body mass index (BMI) 37.0-37.9, adult: Secondary | ICD-10-CM

## 2021-04-04 DIAGNOSIS — R0683 Snoring: Secondary | ICD-10-CM

## 2021-04-04 NOTE — Progress Notes (Signed)
I,Katawbba Wiggins,acting as a Education administrator for [name], MD.,have documented all relevant documentation on the behalf of [name], MD,as directed by  [name], MD while in the presence of [name], MD.  This visit occurred during the SARS-CoV-2 public health emergency.  Safety protocols were in place, including screening questions prior to the visit, additional usage of staff PPE, and extensive cleaning of exam room while observing appropriate contact time as indicated for disinfecting solutions.  Subjective:     Patient ID: [name] , female    DOB: 22-Sep-1970 , 51 y.o.   MRN: [id number]   Chief Complaint  Patient presents with   Hyperlipidemia    HPI  She is here today for a cholesterol check.   She is not taking any rx meds at this time, despite the recommendation in Sept 2022. She admits she is not back on her healthy eating regimen/lifestyle. She is hoping to start back soon.     Hyperlipidemia This is a chronic problem. The current episode started more than 1 year ago. The problem is uncontrolled. Recent lipid tests were reviewed and are high. Exacerbating diseases include obesity. Associated symptoms include chest pain. Pertinent negatives include no focal sensory loss, myalgias or shortness of breath. Current antihyperlipidemic treatment includes diet change. The current treatment provides moderate improvement of lipids.    Past Medical History:  Diagnosis Date   Abdominal pain    Allergy    Allergy-induced asthma    Angina 05/2011   NO HEART PROBLEM WENT TO ED 05/22/11 FOR CP STRESS TEST WAS NEGATIVE    Ankle fracture    Chronic back pain greater than 3 months duration 01/17/2011   injured lumbar and posterior cervical neck in fall    Constipation    DDD (degenerative disc disease), cervical    Diverticulitis    Gallbladder problem    GERD (gastroesophageal reflux disease)    H/O hiatal hernia    Hyperlipidemia    IBS (irritable bowel  syndrome)    Nausea    Neuromuscular disorder (HCC)    LUMBA BACK  TROUBLE    Pre-diabetes    Rash    UNDER BIL BREASTS   Wrist fracture      Family History  Problem Relation Nicholson of Onset   Diabetes Mother    Hyperlipidemia Mother    Cancer Mother    Sleep apnea Mother    Obesity Mother    Heart disease Father    Cancer Father        kidney and prostate   Hyperlipidemia Father    Other Father        gout and clogged arteries   Hypertension Father    Kidney disease Father    Alcoholism Father    Renal Disease Father    Hyperlipidemia Sister    Diabetes Sister    Diabetes Brother    Hyperlipidemia Brother    Heart disease Maternal Grandmother      Current Outpatient Medications:    albuterol (VENTOLIN HFA) 108 (90 Base) MCG/ACT inhaler, 2 puffs q 4 hours as neededFor shortness of breath Ok to sub with proair (covered rx), Disp: 18 g, Rfl: 1   B Complex-C (B COMPLEX-VITAMIN C PO), Take 1,000 mg by mouth. 1 per day (Patient not taking: Reported on 10/13/2020), Disp: , Rfl:    Cholecalciferol (VITAMIN D3) 250 MCG (10000 UT) TABS, Take 1 capsule by mouth daily. (Patient not taking: Reported on 10/13/2020), Disp: ,  Rfl:    cyclobenzaprine (FLEXERIL) 10 MG tablet, Take 1 tablet (10 mg total) by mouth 3 (three) times daily as needed for muscle spasms. (Patient not taking: Reported on 10/13/2020), Disp: 30 tablet, Rfl: 0   ELDERBERRY PO, Take by mouth. 1 per day (Patient not taking: Reported on 10/13/2020), Disp: , Rfl:    Fexofenadine HCl (ALLEGRA PO), Take 1 tablet by mouth daily. (Patient not taking: Reported on 10/13/2020), Disp: , Rfl:    linaclotide (LINZESS) 145 MCG CAPS capsule, Take 145 mcg by mouth daily before breakfast. (Patient not taking: Reported on 10/13/2020), Disp: , Rfl:    montelukast (SINGULAIR) 10 MG tablet, Take 1 tablet (10 mg total) by mouth daily. (Patient not taking: Reported on 10/13/2020), Disp: 90 tablet, Rfl: 1   NON FORMULARY, 2 capsules daily. Optimal - M  Bone, nerves, muscles (Patient not taking: Reported on 10/13/2020), Disp: , Rfl:    NON FORMULARY, 3 capsules daily. Rejuveniix Energy and Mental Alertness (Patient not taking: Reported on 10/13/2020), Disp: , Rfl:    NON FORMULARY, 2 capsules at bedtime. Omega-Q Heart and Brain Function (Patient not taking: Reported on 10/13/2020), Disp: , Rfl:    NON FORMULARY, 1 capsule. Vinali Heart, Eyes, Skin, Lungs (Patient not taking: Reported on 10/13/2020), Disp: , Rfl:    NON FORMULARY, 2 capsules. Porbiotiix (Patient not taking: Reported on 10/13/2020), Disp: , Rfl:    zinc gluconate 50 MG tablet, Take 50 mg by mouth daily. (Patient not taking: Reported on 10/13/2020), Disp: , Rfl:    Allergies  Allergen Reactions   Amoxicillin Hives   Sulfa Antibiotics Hives   Penicillins Hives and Itching    Has patient had a PCN reaction causing immediate rash, facial/tongue/throat swelling, SOB or lightheadedness with hypotension: Yes Has patient had a PCN reaction causing severe rash involving mucus membranes or skin necrosis: No Has patient had a PCN reaction that required hospitalization No Has patient had a PCN reaction occurring within the last 10 years: Yes If all of the above answers are "NO", then may proceed with Cephalosporin use.    Tape     PLASTIC TAPE   (WHELPS, TORE SKIN)   Tramadol Hives     Review of Systems  Constitutional: Negative.   Respiratory: Negative.  Negative for shortness of breath.   Cardiovascular:  Positive for chest pain.       She c/o intermittent chest pains. She states it usually occurs at rest, she has awakened with chest pain. States she sometimes awakens gasping for breath. Described as a dull pain, intensifies with movement. She did have an episode of warmth, but denies diaphoresis. There were associated palpitations as well.    Gastrointestinal: Negative.   Musculoskeletal:  Negative for myalgias.  Psychiatric/Behavioral: Negative.    All other systems reviewed and are  negative.   Today's Vitals   04/04/21 1519  BP: 120/78  Pulse: 97  Temp: 98.7 F (37.1 C)  Weight: 250 lb 6.4 oz (113.6 kg)  Height: 5' 8.8" (1.748 m)   Body mass index is 37.19 kg/m.  Wt Readings from Last 3 Encounters:  04/04/21 250 lb 6.4 oz (113.6 kg)  10/26/20 234 lb 12.8 oz (106.5 kg)  10/13/20 231 lb 3.2 oz (104.9 kg)    BP Readings from Last 3 Encounters:  04/04/21 120/78  10/26/20 124/88  10/13/20 122/90    Objective:  Physical Exam Vitals and nursing note reviewed.  Constitutional:      Appearance: Normal appearance. She is obese.  HENT:     Head: Normocephalic and atraumatic.     Nose:     Comments: Masked     Mouth/Throat:     Comments: Masked  Eyes:     Extraocular Movements: Extraocular movements intact.  Cardiovascular:     Rate and Rhythm: Normal rate and regular rhythm.     Heart sounds: Normal heart sounds.  Pulmonary:     Effort: Pulmonary effort is normal.     Breath sounds: Normal breath sounds.     Comments: Chest wall tenderness to deep palpation,  Musculoskeletal:     Cervical back: Normal range of motion.  Skin:    General: Skin is warm.  Neurological:     General: No focal deficit present.     Mental Status: She is alert.  Psychiatric:        Mood and Affect: Mood normal.        Behavior: Behavior normal.        Assessment And Plan:     1. Pure hypercholesterolemia Comments: She does not wish to take rx meds; however, she is now considering this due to recent diagnoses of her siblings. No need to check labs since she has yet to start meds. Prefers to start after Cardiology evaluation.   2. Atypical chest pain Comments: Due to cardiac risk factors, I will check an EKG. EKG - NSR w/o acute changes. I will also refer her to Cardiology for further evaluation due to her cardiac risk factors including obesity, family history and hyperlipidemia.  - EKG 12-Lead - Ambulatory referral to Cardiology  3. Snoring Comments: She also  admits to nocturia, episodes of non-restorative sleep and fatigue. She also admits to awakening w/ SOB as well.  - Ambulatory referral to Neurology  4. Class 2 obesity due to excess calories without serious comorbidity with body mass index (BMI) of 37.0 to 37.9 in adult Comments:  She is encouraged to strive for BMI less than 30 to decrease cardiac risk. Advised to aim for at least 150 minutes of exercise per week.   5. Family history of heart disease Comments: She has had two siblings recently diagnosed with CAD, one needed bypass surgery.  - Ambulatory referral to Cardiology  6. Need for vaccination Comments: She was given flu vaccine IM x1.  - Flu Vaccine QUAD 6+ mos PF IM (Fluarix Quad PF)  Patient was given opportunity to ask questions. Patient verbalized understanding of the plan and was able to repeat key elements of the plan. All questions were answered to their satisfaction.   I, [name], MD, have reviewed all documentation for this visit. The documentation on 04/04/21 for the exam, diagnosis, procedures, and orders are all accurate and complete.   IF YOU HAVE BEEN REFERRED TO A SPECIALIST, IT MAY TAKE 1-2 WEEKS TO SCHEDULE/PROCESS THE REFERRAL. IF YOU HAVE NOT HEARD FROM US/SPECIALIST IN TWO WEEKS, PLEASE GIVE [place] A CALL AT [phone].   THE PATIENT IS ENCOURAGED TO PRACTICE SOCIAL DISTANCING DUE TO THE COVID-19 PANDEMIC.

## 2021-04-04 NOTE — Patient Instructions (Addendum)
Dandelion root tea daily Metamucil once daily  Mediterranean Diet A Mediterranean diet refers to food and lifestyle choices that are based on the traditions of countries located on the [employer]. It focuses on eating more fruits, vegetables, whole grains, beans, nuts, seeds, and heart-healthy fats, and eating less dairy, meat, eggs, and processed foods with added sugar, salt, and fat. This way of eating has been shown to help prevent certain conditions and improve outcomes for people who have chronic diseases, like kidney disease and heart disease. What are tips for following this plan? Reading food labels Check the serving size of packaged foods. For foods such as rice and pasta, the serving size refers to the amount of cooked product, not dry. Check the total fat in packaged foods. Avoid foods that have saturated fat or trans fats. Check the ingredient list for added sugars, such as corn syrup. Shopping  Buy a variety of foods that offer a balanced diet, including: Fresh fruits and vegetables (produce). Grains, beans, nuts, and seeds. Some of these may be available in unpackaged forms or large amounts (in bulk). Fresh seafood. Poultry and eggs. Low-fat dairy products. Buy whole ingredients instead of prepackaged foods. Buy fresh fruits and vegetables in-season from local farmers markets. Buy plain frozen fruits and vegetables. If you do not have access to quality fresh seafood, buy precooked frozen shrimp or canned fish, such as tuna, salmon, or sardines. Stock your pantry so you always have certain foods on hand, such as olive oil, canned tuna, canned tomatoes, rice, pasta, and beans. Cooking Cook foods with extra-virgin olive oil instead of using butter or other vegetable oils. Have meat as a side dish, and have vegetables or grains as your main dish. This means having meat in small portions or adding small amounts of meat to foods like pasta or stew. Use beans or vegetables  instead of meat in common dishes like chili or lasagna. Experiment with different cooking methods. Try roasting, broiling, steaming, and sauting vegetables. Add frozen vegetables to soups, stews, pasta, or rice. Add nuts or seeds for added healthy fats and plant protein at each meal. You can add these to yogurt, salads, or vegetable dishes. Marinate fish or vegetables using olive oil, lemon juice, garlic, and fresh herbs. Meal planning Plan to eat one vegetarian meal one day each week. Try to work up to two vegetarian meals, if possible. Eat seafood two or more times a week. Have healthy snacks readily available, such as: Vegetable sticks with hummus. Greek yogurt. Fruit and nut trail mix. Eat balanced meals throughout the week. This includes: Fruit: 2-3 servings a day. Vegetables: 4-5 servings a day. Low-fat dairy: 2 servings a day. Fish, poultry, or lean meat: 1 serving a day. Beans and legumes: 2 or more servings a week. Nuts and seeds: 1-2 servings a day. Whole grains: 6-8 servings a day. Extra-virgin olive oil: 3-4 servings a day. Limit red meat and sweets to only a few servings a month. Lifestyle  Cook and eat meals together with your family, when possible. Drink enough fluid to keep your urine pale yellow. Be physically active every day. This includes: Aerobic exercise like running or swimming. Leisure activities like gardening, walking, or housework. Get 7-8 hours of sleep each night. If recommended by your health care provider, drink red wine in moderation. This means 1 glass a day for nonpregnant women and 2 glasses a day for men. A glass of wine equals 5 oz (150 mL). What foods should I eat?  Fruits Apples. Apricots. Avocado. Berries. Bananas. Cherries. Dates. Figs. Grapes. Lemons. Melon. Oranges. Peaches. Plums. Pomegranate. Vegetables Artichokes. Beets. Broccoli. Cabbage. Carrots. Eggplant. Green beans. Chard. Kale. Spinach. Onions. Leeks. Peas. Squash. Tomatoes.  Peppers. Radishes. Grains Whole-grain pasta. Brown rice. Bulgur wheat. Polenta. Couscous. Whole-wheat bread. [place]. Meats and other proteins Beans. Almonds. Sunflower seeds. Pine nuts. Peanuts. [place]. Salmon. Scallops. Shrimp. Biggers. Tilapia. Clams. Oysters. Eggs. Poultry without skin. Dairy Low-fat milk. Cheese. Greek yogurt. Fats and oils Extra-virgin olive oil. Avocado oil. Grapeseed oil. Beverages Water. Red wine. Herbal tea. Sweets and desserts Greek yogurt with honey. Baked apples. Poached pears. Trail mix. Seasonings and condiments Basil. Cilantro. Coriander. Cumin. Mint. Parsley. Sage. Rosemary. Tarragon. Garlic. Oregano. Thyme. Pepper. Balsamic vinegar. Tahini. Hummus. Tomato sauce. Olives. Mushrooms. The items listed above may not be a complete list of foods and beverages you can eat. Contact a dietitian for more information. What foods should I limit? This is a list of foods that should be eaten rarely or only on special occasions. Fruits Fruit canned in syrup. Vegetables Deep-fried potatoes (french fries). Grains Prepackaged pasta or rice dishes. Prepackaged cereal with added sugar. Prepackaged snacks with added sugar. Meats and other proteins Beef. Pork. Lamb. Poultry with skin. Hot dogs. Berniece Salines. Dairy Ice cream. Sour cream. Whole milk. Fats and oils Butter. Canola oil. Vegetable oil. Beef fat (tallow). Lard. Beverages Juice. Sugar-sweetened soft drinks. Beer. Liquor and spirits. Sweets and desserts Cookies. Cakes. Pies. Candy. Seasonings and condiments Mayonnaise. Pre-made sauces and marinades. The items listed above may not be a complete list of foods and beverages you should limit. Contact a dietitian for more information. Summary The Mediterranean diet includes both food and lifestyle choices. Eat a variety of fresh fruits and vegetables, beans, nuts, seeds, and whole grains. Limit the amount of red meat and sweets that you eat. If recommended by your  health care provider, drink red wine in moderation. This means 1 glass a day for nonpregnant women and 2 glasses a day for men. A glass of wine equals 5 oz (150 mL). This information is not intended to replace advice given to you by your health care provider. Make sure you discuss any questions you have with your health care provider. Document Revised: 03/14/2019 Document Reviewed: 01/09/2019 Elsevier Patient Education  2022 [employer].

## 2021-05-10 ENCOUNTER — Ambulatory Visit (INDEPENDENT_AMBULATORY_CARE_PROVIDER_SITE_OTHER): Payer: BC Managed Care – PPO

## 2021-05-10 ENCOUNTER — Ambulatory Visit (INDEPENDENT_AMBULATORY_CARE_PROVIDER_SITE_OTHER): Payer: BC Managed Care – PPO | Admitting: Cardiovascular Disease

## 2021-05-10 ENCOUNTER — Other Ambulatory Visit: Payer: Self-pay

## 2021-05-10 ENCOUNTER — Encounter (HOSPITAL_BASED_OUTPATIENT_CLINIC_OR_DEPARTMENT_OTHER): Payer: Self-pay | Admitting: Cardiovascular Disease

## 2021-05-10 VITALS — BP 120/82 | HR 78 | Ht 68.8 in | Wt 249.0 lb

## 2021-05-10 DIAGNOSIS — K219 Gastro-esophageal reflux disease without esophagitis: Secondary | ICD-10-CM | POA: Diagnosis not present

## 2021-05-10 DIAGNOSIS — Z6837 Body mass index (BMI) 37.0-37.9, adult: Secondary | ICD-10-CM

## 2021-05-10 DIAGNOSIS — R002 Palpitations: Secondary | ICD-10-CM | POA: Diagnosis not present

## 2021-05-10 DIAGNOSIS — R0789 Other chest pain: Secondary | ICD-10-CM

## 2021-05-10 DIAGNOSIS — E7800 Pure hypercholesterolemia, unspecified: Secondary | ICD-10-CM

## 2021-05-10 DIAGNOSIS — R0683 Snoring: Secondary | ICD-10-CM

## 2021-05-10 DIAGNOSIS — E6609 Other obesity due to excess calories: Secondary | ICD-10-CM

## 2021-05-10 MED ORDER — METOPROLOL TARTRATE 50 MG PO TABS: ORAL_TABLET | ORAL | 0 refills | Status: DC

## 2021-05-10 NOTE — Progress Notes (Unsigned)
Enrolled patient for a 7 day Zio XT monitor to be mailed to patients home.  

## 2021-05-10 NOTE — Patient Instructions (Addendum)
Medication Instructions:  ?TAKE METOPROLOL TART 50 MG 1 TABLET 2 HOURS PRIOR TO CT  ? ?*If you need a refill on your cardiac medications before your next appointment, please call your pharmacy* ? ?Lab Work: ?BMET 1 WEEK PRIOR TO CT ? ?If you have labs (blood work) drawn today and your tests are completely normal, you will receive your results only by: ?MyChart Message (if you have MyChart) OR ?A paper copy in the mail ?If you have any lab test that is abnormal or we need to change your treatment, we will call you to review the results. ? ?Testing/Procedures: ?Your physician has requested that you have cardiac CT. Cardiac computed tomography (CT) is a painless test that uses an x-ray machine to take clear, detailed pictures of your heart. For further information please visit [email or web address].[email or web address]. Please follow instruction sheet as given. ? ?7 DAY ZIO  ?THIS WILL BE MAILED TO YOU  ? ?Follow-Up: ? ?September 01, 2021 AT 8:00 AM WITH [facility]  ? ?Other Instructions ? ? ? ?Your cardiac CT will be scheduled at one of the below locations:  ? ?[facility] ?[address] ?[place], [place] [address] ?[phone] ? ?OR ? ?[place] ?[address] ?[address] ?[place], [place] [address] ?[phone] ?If scheduled at [facility], please arrive at the [facility] and Children's Entrance (Entrance C2) of [facility] 30 minutes prior to test start time. ?You can use the FREE valet parking offered at entrance C (encouraged to control the heart rate for the test)  ?Proceed to the [facility] Radiology Department (first floor) to check-in and test prep. ? ?All radiology patients and guests should use entrance C2 at [facility], accessed from [facility], even though the hospital's physical address listed is [address]. ? ? ? ?If scheduled at [facility], please arrive 15 mins early for check-in and test  prep. ? ?Please follow these instructions carefully (unless otherwise directed): ? ?Hold all erectile dysfunction medications at least 3 days (72 hrs) prior to test. ? ?On the Night Before the Test: ?Be sure to Drink plenty of water. ?Do not consume any caffeinated/decaffeinated beverages or chocolate 12 hours prior to your test. ?Do not take any antihistamines 12 hours prior to your test. ?If the patient has contrast allergy: ?Patient will need a prescription for Prednisone and very clear instructions (as follows): ?Prednisone 50 mg - take 13 hours prior to test ?Take another Prednisone 50 mg 7 hours prior to test ?Take another Prednisone 50 mg 1 hour prior to test ?Take Benadryl 50 mg 1 hour prior to test ?Patient must complete all four doses of above prophylactic medications. ?Patient will need a ride after test due to Benadryl. ? ?On the Day of the Test: ?Drink plenty of water until 1 hour prior to the test. ?Do not eat any food 4 hours prior to the test. ?You may take your regular medications prior to the test.  ?Take metoprolol (Lopressor) two hours prior to test. ?HOLD Furosemide/Hydrochlorothiazide morning of the test. ?FEMALES- please wear underwire-free bra if available, avoid dresses & tight clothing ? ?After the Test: ?Drink plenty of water. ?After receiving IV contrast, you may experience a mild flushed feeling. This is normal. ?On occasion, you may experience a mild rash up to 24 hours after the test. This is not dangerous. If this occurs, you can take Benadryl 25 mg and increase your fluid intake. ?If you  experience trouble breathing, this can be serious. If it is severe call 911 IMMEDIATELY. If it is mild, please call our office. ?If you take any of these medications: Glipizide/Metformin, Avandament, Glucavance, please do not take 48 hours after completing test unless otherwise instructed. ? ?We will call to schedule your test 2-4 weeks out understanding that some insurance companies will need an  authorization prior to the service being performed.  ? ?For non-scheduling related questions, please contact the cardiac imaging nurse navigator should you have any questions/concerns: ?Marchia Bond, Cardiac Imaging Nurse Navigator ?Gordy Clement, Cardiac Imaging Nurse Navigator ?[facility] ?Direct Office Dial: [phone]  ? ?For scheduling needs, including cancellations and rescheduling, please call [place], [phone]. ? ?ZIO XT- Long Term Monitor Instructions ? ?Your physician has requested you wear a ZIO patch monitor for 7 days.  ?This is a single patch monitor. [employer] supplies one patch monitor per enrollment. Additional ?stickers are not available. Please do not apply patch if you will be having a Nuclear Stress Test,  ?Echocardiogram, Cardiac CT, MRI, or Chest Xray during the period you would be wearing the  ?monitor. The patch cannot be worn during these tests. You cannot remove and re-apply the  ?ZIO XT patch monitor.  ?Your ZIO patch monitor will be mailed 3 day USPS to your address on file. It may take 3-5 days  ?to receive your monitor after you have been enrolled.  ?Once you have received your monitor, please review the enclosed instructions. Your monitor  ?has already been registered assigning a specific monitor serial # to you. ? ?Billing and Patient Assistance Program Information ? ?We have supplied [employer] with any of your insurance information on file for billing purposes. ?[employer] offers a sliding scale Patient Assistance Program for patients that do not have  ?insurance, or whose insurance does not completely cover the cost of the ZIO monitor.  ?You must apply for the Patient Assistance Program to qualify for this discounted rate.  ?To apply, please call [employer] at [phone], select option 4, select option 2, ask to apply for  ?Patient Assistance Program. Theodore Demark will ask your household income, and how many people  ?are in your household. They will quote  your out-of-pocket cost based on that information.  ?[employer] will also be able to set up a 35-month interest-free payment plan if needed. ? ?Applying the monitor ?  ?Shave hair from upper left chest.  ?Hold abrader disc by orange tab. Rub abrader in 40 strokes over the upper left chest as  ?indicated in your monitor instructions.  ?Clean area with 4 enclosed alcohol pads. Let dry.  ?Apply patch as indicated in monitor instructions. Patch will be placed under collarbone on left  ?side of chest with arrow pointing upward.  ?Rub patch adhesive wings for 2 minutes. Remove white label marked "1". Remove the white  ?label marked "2". Rub patch adhesive wings for 2 additional minutes.  ?While looking in a mirror, press and release button in center of patch. A small green light will  ?flash 3-4 times. This will be your only indicator that the monitor has been turned on.  ?Do not shower for the first 24 hours. You may shower after the first 24 hours.  ?Press the button if you feel a symptom. You will hear a small click. Record Date, Time and  ?Symptom in the Patient Logbook.  ?When you are ready to remove the patch, follow instructions on the last 2 pages of Patient  ?Logbook.  Stick patch monitor onto the last page of Patient Logbook.  ?Place Patient Logbook in the blue and white box. Use locking tab on box and tape box closed  ?securely. The blue and white box has prepaid postage on it. Please place it in the mailbox as  ?soon as possible. Your physician should have your test results approximately 7 days after the  ?monitor has been mailed back to [facility].  ?Call [facility] at [phone] if you have questions regarding  ?your ZIO XT patch monitor. Call them immediately if you see an orange light blinking on your  ?monitor.  ?If your monitor falls off in less than 4 days, contact our Monitor department at [phone].  ?If your monitor becomes loose or falls off after 4 days call [employer] at  [phone] for  ?suggestions on securing your monitor ? ?Cardiac CT Angiogram ?A cardiac CT angiogram is a procedure to look at the heart and the area around the heart. It may be done to help find the cause of chest pa

## 2021-05-10 NOTE — Progress Notes (Signed)
? ? ?Cardiology Office Note ? ? ?Date:  05/10/2021  ? ?ID:  [name], DOB 01-08-1971, MRN [id number] ? ?PCP:  Glendale Chard, MD  ?Cardiologist:   Skeet Latch, MD  ? ?No chief complaint on file. ? ? ?  ?History of Present Illness: ?[name] is a 51 y.o. female with hyperlipidemia, GERD, and prediabetes who is being seen today for the evaluation of chest pain at the request of Glendale Chard, MD. She saw Dr. Baird Cancer 03/2021 and reported chest pain.  EKG was unremarkable.  Given her cardiac risk factors she was referred to cardiology.  She was also referred for a sleep study due to snoring and PND. ? ?She notes chest discomfort when laying in bed and sometimes at work.  It gets worse when she leans forward or when she rolls over in the bed.  It doesn't happen with exertion.  It is a throbbing in the center of her chest and doesn't radiate.  There is no associated shortness of breath but she gets mild nausea.  One time her husband brought her Sprite and she was able to belch and felt better. She changed her diet to reduce GERD and hasn't had any improvement. She started exercising three days per week and has no exertional symptoms.  She sometimes awakens gasping for air.  She also has noted a 40 pound weight gain recently.  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  She has no LE edema, orthopnea or PND.  Lately she has struggled with cough and headaches.  She notes that her lipids have always been elevated and she didn't tolerate simvastatin, atorvastatin and rosuvastatin.  She is not a fan of medications and tries to avoid that if at all possible. ? ?Past Medical History:  ?Diagnosis Date  ? Abdominal pain   ? Allergy   ? Allergy-induced asthma   ? Angina 05/2011  ? NO HEART PROBLEM WENT TO ED 05/22/11 FOR CP STRESS TEST WAS NEGATIVE   ? Ankle fracture   ? Chronic back pain greater than 3 months duration 01/17/2011  ? injured lumbar and posterior  cervical neck in fall   ? Constipation   ? DDD (degenerative disc disease), cervical   ? Diverticulitis   ? Gallbladder problem   ? GERD (gastroesophageal reflux disease)   ? H/O hiatal hernia   ? Hyperlipidemia   ? IBS (irritable bowel syndrome)   ? Nausea   ? Neuromuscular disorder ([place])   ? [place]   ? Pre-diabetes   ? Rash   ? UNDER BIL BREASTS  ? Wrist fracture   ? ? ?Past Surgical History:  ?Procedure Laterality Date  ? BREAST CYST EXCISION Left ~ 1983  ? CHOLECYSTECTOMY  08/2010  ? COLONOSCOPY  07/2010  ? Removal of 3 polyps  ? DILATATION & CURETTAGE/HYSTEROSCOPY WITH MYOSURE N/A 09/03/2015  ? Procedure: [place];  Surgeon: Servando Salina, MD;  Location: [facility];  Service: Gynecology;  Laterality: N/A;  ? FRACTURE SURGERY  06/12/11  ? patient denies  ? HERNIA REPAIR  3154; 0/0867  ? umbilical corrected during tubal ligation; VHR w/mesh  ? TUBAL LIGATION  12/1995  ? VENTRAL HERNIA REPAIR  06/12/2011  ? Procedure: LAPAROSCOPIC VENTRAL HERNIA;  Surgeon: Gwenyth Ober, MD;  Location: [facility];  Service: General;  Laterality: N/A;  ? WISDOM TOOTH EXTRACTION  1990's  ? ? ? ?Current Outpatient  Medications  ?Medication Sig Dispense Refill  ? albuterol (VENTOLIN HFA) 108 (90 Base) MCG/ACT inhaler 2 puffs q 4 hours as neededFor shortness of breath Ok to sub with proair (covered rx) 18 g 1  ? Cholecalciferol (VITAMIN D3) 250 MCG (10000 UT) TABS Take 1 capsule by mouth daily.    ? ELDERBERRY PO Take by mouth. 1 per day    ? metoprolol tartrate (LOPRESSOR) 50 MG tablet TAKE 1 TABLET 2 HOURS PRIOR TO CT 1 tablet 0  ? NON FORMULARY 2 capsules daily. Optimal - M ?Bone, nerves, muscles    ? NON FORMULARY 3 capsules daily. Rejuveniix ?Energy and Mental Alertness    ? NON FORMULARY 2 capsules at bedtime. Omega-Q ?Heart and Brain Function    ? NON FORMULARY 1 capsule. Vinali ?Heart, Eyes, Skin, Lungs    ? NON FORMULARY 2 capsules. Porbiotiix    ? zinc gluconate 50 MG tablet Take  50 mg by mouth daily.    ? ?No current facility-administered medications for this visit.  ? ? ?Allergies:   Amoxicillin, Sulfa antibiotics, Penicillins, Tape, and Tramadol  ? ? ?Social History:  The patient  reports that she has never smoked. She has never used smokeless tobacco. She reports current alcohol use. She reports that she does not use drugs.  ? ?Family History:  The patient's family history includes Alcoholism in her father; Cancer in her father and mother; Coronary artery disease in her brother; Diabetes in her brother, mother, and sister; Heart attack in her father; Heart disease in her maternal grandmother; Hyperlipidemia in her brother, father, mother, and sister; Hypertension in her father; Kidney disease in her father; Obesity in her mother; Other in her father; Renal Disease in her father; Sleep apnea in her mother.  ? ? ?ROS:  Please see the history of present illness.   Otherwise, review of systems are positive for none.   All other systems are reviewed and negative.  ? ? ?PHYSICAL EXAM: ?VS:  BP 120/82 (BP Location: Right Arm, Patient Position: Sitting, Cuff Size: Large)   Pulse 78   Ht 5' 8.8" (1.748 m)   Wt 249 lb (112.9 kg)   LMP 11/05/2015 Comment: tubal ligation  BMI 36.98 kg/m?  , BMI Body mass index is 36.98 kg/m?. ?GENERAL:  Well appearing ?HEENT:  Pupils equal round and reactive, fundi not visualized, oral mucosa unremarkable ?NECK:  No jugular venous distention, waveform within normal limits, carotid upstroke brisk and symmetric, no bruits, no thyromegaly ?LUNGS:  Clear to auscultation bilaterally ?HEART:  RRR.  PMI not displaced or sustained,S1 and S2 within normal limits, no S3, no S4, no clicks, no rubs, no murmurs ?ABD:  Flat, positive bowel sounds normal in frequency in pitch, no bruits, no rebound, no guarding, no midline pulsatile mass, no hepatomegaly, no splenomegaly ?EXT:  2 plus pulses throughout, no edema, no cyanosis no clubbing ?SKIN:  No rashes no nodules ?NEURO:   Cranial nerves II through XII grossly intact, motor grossly intact throughout ?PSYCH:  Cognitively intact, oriented to person place and time ? ? ? ?EKG:  EKG is ordered today. ?The ekg ordered today demonstrates sinus rhythm.  Rate 78 bpm ? ? ?Recent Labs: ?06/30/2020: TSH 1.34 ?10/26/2020: ALT 25; BUN 14; Creatinine, Ser 0.73; Hemoglobin 14.4; Platelets 213; Potassium 4.8; Sodium 138  ? ? ?Lipid Panel ?   ?Component Value Date/Time  ? CHOL 247 (H) 10/26/2020 7062  ? TRIG 135 10/26/2020 0952  ? HDL 53 10/26/2020 0952  ? CHOLHDL 4.7 (  H) 10/26/2020 3016  ? [place] 170 (H) 10/26/2020 0109  ? ?  ? ?Wt Readings from Last 3 Encounters:  ?05/10/21 249 lb (112.9 kg)  ?04/04/21 250 lb 6.4 oz (113.6 kg)  ?10/26/20 234 lb 12.8 oz (106.5 kg)  ?  ? ? ?ASSESSMENT AND PLAN: ? ?# Chest pain: ?# Hyperlipidemia:  ?Symptoms are atypical.  I suspect it is attributable to GERD.  We did discuss the importance of not eating for several hours before going to bed and trying to avoid trigger foods.  Also suggested sleeping with her head elevated.  However she has untreated hyperlipidemia  that has been uncontrolled.  We will get a coronary CTA which will help [place] determine whether she has any coronary artery disease to help determine her lipid goal.  She has not tolerated statins in the past.  She does have evidence of coronary artery disease she will need to be considered for an alternative agent.  She would like to work on her diet and exercise.  We discussed the fact that if her LDL goal is less than 70.  She would need to be vegan, lose significant amount of weight, and exercise regularly to have a chance of her lipids getting to under 70 without medications.  She expressed understanding and would like To try.  Repeat lipids and a CMP in 3 months. ? ?# Palpitations:  ?Given that it only occurs when she is laying down I suspect this is attributable to GERD as well.  Labs have been unremarkable.  We will get a 7-day ZIO monitor just to make  sure. ? ? ?Current medicines are reviewed at length with the patient today.  The patient does not have concerns regarding medicines. ? ?The following changes have been made:  no change ? ?Labs/ tests ordered tod

## 2021-05-12 ENCOUNTER — Telehealth (HOSPITAL_BASED_OUTPATIENT_CLINIC_OR_DEPARTMENT_OTHER): Payer: Self-pay | Admitting: *Deleted

## 2021-05-12 NOTE — Telephone Encounter (Signed)
? ?  Pre-operative Risk Assessment  ?  ?Patient Name: [name]  ?DOB: 01/12/1971 ?MRN: [id number]  ? ?  ? ?Request for Surgical Clearance   ? ?Procedure:  COLONOSCOPY  ? ?Date of Surgery:  Clearance TBD                              ?   ?Surgeon:   ?Surgeon's Group or Practice Name:  [facility]  ?Phone number:  [phone]  ?Fax number:  [phone] ?  ?Type of Clearance Requested:   ?- Medical  ?  ?Type of Anesthesia:   PROPOFOL  ?{ ? ? ? ?

## 2021-05-16 DIAGNOSIS — R002 Palpitations: Secondary | ICD-10-CM

## 2021-05-20 LAB — BASIC METABOLIC PANEL
BUN/Creatinine Ratio: 18 (ref 9–23)
BUN: 12 mg/dL (ref 6–24)
CO2: 21 mmol/L (ref 20–29)
Calcium: 10.1 mg/dL (ref 8.7–10.2)
Chloride: 100 mmol/L (ref 96–106)
Creatinine, Ser: 0.66 mg/dL (ref 0.57–1.00)
Glucose: 68 mg/dL — ABNORMAL LOW (ref 70–99)
Potassium: 4.3 mmol/L (ref 3.5–5.2)
Sodium: 139 mmol/L (ref 134–144)
eGFR: 105 mL/min/{1.73_m2} (ref >59)

## 2021-05-22 ENCOUNTER — Encounter (HOSPITAL_BASED_OUTPATIENT_CLINIC_OR_DEPARTMENT_OTHER): Payer: Self-pay | Admitting: Cardiovascular Disease

## 2021-05-24 ENCOUNTER — Ambulatory Visit (HOSPITAL_BASED_OUTPATIENT_CLINIC_OR_DEPARTMENT_OTHER): Payer: BC Managed Care – PPO | Admitting: Cardiovascular Disease

## 2021-05-24 ENCOUNTER — Telehealth (HOSPITAL_COMMUNITY): Payer: Self-pay | Admitting: Emergency Medicine

## 2021-05-24 NOTE — Telephone Encounter (Signed)
Reaching out to patient to offer assistance regarding upcoming cardiac imaging study; pt verbalizes understanding of appt date/time, parking situation and where to check in, pre-test NPO status and medications ordered, and verified current allergies; name and call back number provided for further questions should they arise ?Marchia Bond RN Navigator Cardiac Imaging ?[facility] ?[phone] office ?[phone] cell ? ?[place] ?Avoid L arm  ?'50mg'$  metoprolol tartrate ?Arrival 730 ? ?

## 2021-05-25 ENCOUNTER — Ambulatory Visit (HOSPITAL_COMMUNITY)
Admission: RE | Admit: 2021-05-25 | Discharge: 2021-05-25 | Disposition: A | Discharge status: Home / Self Care | Payer: BC Managed Care – PPO | Source: Ambulatory Visit | Attending: Cardiovascular Disease | Admitting: Cardiovascular Disease

## 2021-05-25 DIAGNOSIS — R002 Palpitations: Secondary | ICD-10-CM | POA: Diagnosis present

## 2021-05-25 DIAGNOSIS — I700 Atherosclerosis of aorta: Secondary | ICD-10-CM

## 2021-05-25 MED ORDER — NITROGLYCERIN 0.4 MG SL SUBL
SUBLINGUAL_TABLET | SUBLINGUAL | Status: AC
  Filled 2021-05-25: qty 2

## 2021-05-25 MED ORDER — IOHEXOL 350 MG/ML SOLN
100.0000 mL | Freq: Once | INTRAVENOUS | Status: AC | PRN
  Administered 2021-05-25: 100 mL via INTRAVENOUS

## 2021-05-25 MED ORDER — NITROGLYCERIN 0.4 MG SL SUBL
0.8000 mg | SUBLINGUAL_TABLET | Freq: Once | SUBLINGUAL | Status: AC
  Administered 2021-05-25: 0.8 mg via SUBLINGUAL

## 2021-05-30 ENCOUNTER — Encounter (HOSPITAL_BASED_OUTPATIENT_CLINIC_OR_DEPARTMENT_OTHER): Payer: Self-pay | Admitting: Cardiovascular Disease

## 2021-05-31 NOTE — Telephone Encounter (Signed)
Prior message already routed to Dr. Oval Linsey ?

## 2021-06-01 NOTE — Telephone Encounter (Signed)
? ?  Primary Cardiologist: None ? ?Chart reviewed as part of pre-operative protocol coverage. Given past medical history and time since last visit, based on ACC/AHA guidelines, [name] would be at acceptable risk for the planned procedure without further cardiovascular testing.  ? ?I will route this recommendation to the requesting party via Epic fax function and remove from pre-op pool. ? ?Please call with questions. ? ?Emmaline Life, NP-C ? ?  ?06/01/2021, 3:59 PM ?[facility] ?[address], [address] ?Office [phone] Fax [phone] ? ? ?

## 2021-06-06 ENCOUNTER — Encounter (HOSPITAL_BASED_OUTPATIENT_CLINIC_OR_DEPARTMENT_OTHER): Payer: Self-pay | Admitting: Cardiovascular Disease

## 2021-06-07 ENCOUNTER — Ambulatory Visit (INDEPENDENT_AMBULATORY_CARE_PROVIDER_SITE_OTHER): Payer: BC Managed Care – PPO | Admitting: Neurology

## 2021-06-07 ENCOUNTER — Encounter: Payer: Self-pay | Admitting: Neurology

## 2021-06-07 VITALS — BP 128/85 | HR 67 | Ht 68.5 in | Wt 245.0 lb

## 2021-06-07 DIAGNOSIS — E669 Obesity, unspecified: Secondary | ICD-10-CM | POA: Insufficient documentation

## 2021-06-07 DIAGNOSIS — E6609 Other obesity due to excess calories: Secondary | ICD-10-CM

## 2021-06-07 DIAGNOSIS — R0683 Snoring: Secondary | ICD-10-CM | POA: Diagnosis not present

## 2021-06-07 DIAGNOSIS — R7303 Prediabetes: Secondary | ICD-10-CM

## 2021-06-07 DIAGNOSIS — F519 Sleep disorder not due to a substance or known physiological condition, unspecified: Secondary | ICD-10-CM | POA: Diagnosis not present

## 2021-06-07 DIAGNOSIS — Z6836 Body mass index (BMI) 36.0-36.9, adult: Secondary | ICD-10-CM

## 2021-06-07 DIAGNOSIS — R0681 Apnea, not elsewhere classified: Secondary | ICD-10-CM

## 2021-06-07 DIAGNOSIS — F40240 Claustrophobia: Secondary | ICD-10-CM | POA: Diagnosis not present

## 2021-06-07 DIAGNOSIS — K219 Gastro-esophageal reflux disease without esophagitis: Secondary | ICD-10-CM

## 2021-06-07 DIAGNOSIS — I10 Essential (primary) hypertension: Secondary | ICD-10-CM

## 2021-06-07 MED ORDER — TRAZODONE HCL 50 MG PO TABS: 50.0000 mg | ORAL_TABLET | Freq: Every day | ORAL | 3 refills | Status: DC
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
## 2021-06-07 NOTE — Patient Instructions (Addendum)
Sleep Apnea ?Sleep apnea affects breathing during sleep. It causes breathing to stop for 10 seconds or more, or to become shallow. People with sleep apnea usually snore loudly. ?It can also increase the risk of: ?Heart attack. ?Stroke. ?Being very overweight (obese). ?Diabetes. ?Heart failure.Quality Sleep Information, Adult ?Quality sleep is important for your mental and physical health. It also improves your quality of life. Quality sleep means you: ?Are asleep for most of the time you are in bed. ?Fall asleep within 30 minutes. ?Wake up no more than once a night.  ?Are awake for no longer than 20 minutes if you do wake up during the night. ?Most adults need 7-8 hours of quality sleep each night. ?How can poor sleep affect me? ?If you do not get enough quality sleep, you may have: ?Mood swings. ?Daytime sleepiness. ?Confusion. ?Decreased reaction time. ?Sleep disorders, such as insomnia and sleep apnea. ?Difficulty with: ?Solving problems. ?Coping with stress. ?Paying attention. ?These issues may affect your performance and productivity at work, school, and at home. Lack of sleep may also put you at higher risk for accidents, suicide, and risky behaviors. ?If you do not get quality sleep you may also be at higher risk for several health problems, including: ?Infections. ?Type 2 diabetes. ?Heart disease. ?High blood pressure. ?Obesity. ?Worsening of long-term conditions, like arthritis, kidney disease, depression, Parkinson's disease, and epilepsy. ?What actions can I take to get more quality sleep? ? ?  ? ?Stick to a sleep schedule. Go to sleep and wake up at about the same time each day. Do not try to sleep less on weekdays and make up for lost sleep on weekends. This does not work. ?Try to get about 30 minutes of exercise on most days. Do not exercise 2-3 hours before going to bed. ?Limit naps during the day to 30 minutes or less. ?Do not use any products that contain nicotine or tobacco, such as cigarettes or  e-cigarettes. If you need help quitting, ask your health care provider. ?Do not drink caffeinated beverages for at least 8 hours before going to bed. Coffee, tea, and some sodas contain caffeine. ?Do not drink alcohol close to bedtime. ?Do not eat large meals close to bedtime. ?Do not take naps in the late afternoon. ?Try to get at least 30 minutes of sunlight every day. Morning sunlight is best. ?Make time to relax before bed. Reading, listening to music, or taking a hot bath promotes quality sleep. ?Make your bedroom a place that promotes quality sleep. Keep your bedroom dark, quiet, and at a comfortable room temperature. Make sure your bed is comfortable. Take out sleep distractions like TV, a computer, smartphone, and bright lights. ?If you are lying awake in bed for longer than 20 minutes, get up and do a relaxing activity until you feel sleepy. ?Work with your health care provider to treat medical conditions that may affect sleeping, such as: ?Nasal obstruction. ?Snoring. ?Sleep apnea and other sleep disorders. ?Talk to your health care provider if you think any of your prescription medicines may cause you to have difficulty falling or staying asleep. ?If you have sleep problems, talk with a sleep consultant. If you think you have a sleep disorder, talk with your health care provider about getting evaluated by a specialist. ?Where to find more information ?[place] website: https://sleepfoundation.org ?National Heart, Lung, and [place] ([place]): http://www.saunders.info/.pdf ?Centers for Disease Control and Prevention (CDC): LearningDermatology.pl ?Contact a health care provider if you: ?Have trouble getting to sleep or staying  asleep. ?Often wake up very early in the morning and cannot get back to sleep. ?Have daytime sleepiness. ?Have daytime sleep attacks of suddenly falling asleep and sudden muscle weakness (narcolepsy). ?Have a tingling sensation  in your legs with a strong urge to move your legs (restless legs syndrome). ?Stop breathing briefly during sleep (sleep apnea). ?Think you have a sleep disorder or are taking a medicine that is affecting your quality of sleep. ?Summary ?Most adults need 7-8 hours of quality sleep each night. ?Getting enough quality sleep is an important part of health and well-being. ?Make your bedroom a place that promotes quality sleep and avoid things that may cause you to have poor sleep, such as alcohol, caffeine, smoking, and large meals. ?Talk to your health care provider if you have trouble falling asleep or staying asleep. ?This information is not intended to replace advice given to you by your health care provider. Make sure you discuss any questions you have with your health care provider. ?Document Revised: 03/14/2021 Document Reviewed: 12/06/2020 ?Elsevier Patient Education ? [place]. ? ?Irregular heartbeat. ?High blood pressure. ?The goal of treatment is to help you breathe normally again. ?What are the causes? ? ?The most common cause of this condition is a collapsed or blocked airway. ?There are three kinds of sleep apnea: ?Obstructive sleep apnea. This is caused by a blocked or collapsed airway. ?Central sleep apnea. This happens when the brain does not send the right signals to the muscles that control breathing. ?Mixed sleep apnea. This is a combination of obstructive and central sleep apnea. ?What increases the risk? ?Being overweight. ?Smoking. ?Having a small airway. ?Being older. ?Being female. ?Drinking alcohol. ?Taking medicines to calm yourself (sedatives or tranquilizers). ?Having family members with the condition. ?Having a tongue or tonsils that are larger than normal. ?What are the signs or symptoms? ?Trouble staying asleep. ?Loud snoring. ?Headaches in the morning. ?Waking up gasping. ?Dry mouth or sore throat in the morning. ?Being sleepy or tired during the day. ?If you are sleepy or tired  during the day, you may also: ?Not be able to focus your mind (concentrate). ?Forget things. ?Get angry a lot and have mood swings. ?Feel sad (depressed). ?Have changes in your personality. ?Have less interest in sex, if you are female. ?Be unable to have an erection, if you are female. ?How is this treated? ? ?Sleeping on your side. ?Using a medicine to get rid of mucus in your nose (decongestant). ?Avoiding the use of alcohol, medicines to help you relax, or certain pain medicines (narcotics). ?Losing weight, if needed. ?Changing your diet. ?Quitting smoking. ?Using a machine to open your airway while you sleep, such as: ?An oral appliance. This is a mouthpiece that shifts your lower jaw forward. ?A CPAP device. This device blows air through a mask when you breathe out (exhale). ?An EPAP device. This has valves that you put in each nostril. ?A BIPAP device. This device blows air through a mask when you breathe in (inhale) and breathe out. ?Having surgery if other treatments do not work. ?Follow these instructions at home: ?Lifestyle ?Make changes that your doctor recommends. ?Eat a healthy diet. ?Lose weight if needed. ?Avoid alcohol, medicines to help you relax, and some pain medicines. ?Do not smoke or use any products that contain nicotine or tobacco. If you need help quitting, ask your doctor. ?General instructions ?Take over-the-counter and prescription medicines only as told by your doctor. ?If you were given a machine to use while you sleep,  use it only as told by your doctor. ?If you are having surgery, make sure to tell your doctor you have sleep apnea. You may need to bring your device with you. ?Keep all follow-up visits. ?Contact a doctor if: ?The machine that you were given to use during sleep bothers you or does not seem to be working. ?You do not get better. ?You get worse. ?Get help right away if: ?Your chest hurts. ?You have trouble breathing in enough air. ?You have an uncomfortable feeling in your  back, arms, or stomach. ?You have trouble talking. ?One side of your body feels weak. ?A part of your face is hanging down. ?These symptoms may be an emergency. Get help right away. Call your local eme

## 2021-06-10 ENCOUNTER — Ambulatory Visit (INDEPENDENT_AMBULATORY_CARE_PROVIDER_SITE_OTHER): Payer: BC Managed Care – PPO | Admitting: Neurology

## 2021-06-10 DIAGNOSIS — G4733 Obstructive sleep apnea (adult) (pediatric): Secondary | ICD-10-CM | POA: Diagnosis not present

## 2021-06-10 DIAGNOSIS — F40240 Claustrophobia: Secondary | ICD-10-CM

## 2021-06-10 DIAGNOSIS — K219 Gastro-esophageal reflux disease without esophagitis: Secondary | ICD-10-CM

## 2021-06-10 DIAGNOSIS — F519 Sleep disorder not due to a substance or known physiological condition, unspecified: Secondary | ICD-10-CM

## 2021-06-10 DIAGNOSIS — E6609 Other obesity due to excess calories: Secondary | ICD-10-CM

## 2021-06-10 DIAGNOSIS — R0683 Snoring: Secondary | ICD-10-CM

## 2021-06-10 DIAGNOSIS — I10 Essential (primary) hypertension: Secondary | ICD-10-CM
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
## 2021-06-20 NOTE — Addendum Note (Signed)
Addended by: Larey Seat on: 06/20/2021 05:29 PM ? ? Modules accepted: Orders ? ?

## 2021-06-21 ENCOUNTER — Telehealth: Payer: Self-pay | Admitting: *Deleted

## 2021-06-21 DIAGNOSIS — R0683 Snoring: Secondary | ICD-10-CM

## 2021-06-21 DIAGNOSIS — F40240 Claustrophobia: Secondary | ICD-10-CM

## 2021-06-21 DIAGNOSIS — F519 Sleep disorder not due to a substance or known physiological condition, unspecified: Secondary | ICD-10-CM

## 2021-06-21 DIAGNOSIS — G4739 Other sleep apnea: Secondary | ICD-10-CM

## 2021-06-21 NOTE — Telephone Encounter (Signed)
Called [phone]. LVM for pt to call about results. ?

## 2021-06-21 NOTE — Telephone Encounter (Signed)
Pt returned call. I advised pt that Dr. Brett Fairy reviewed their sleep study results and found that pt has mild sleep apnea with hypoxia. Dr. Brett Fairy recommends that pt starts auto PAP. I reviewed PAP compliance expectations with the pt. Pt is agreeable to starting a CPAP. I advised pt that an order will be sent to a DME, [facility], and [facility] will call the pt within about one week after they file with the pt's insurance. [facility] will show the pt how to use the machine, fit for masks, and troubleshoot the CPAP if needed. A follow up appt was made for insurance purposes with Ward Givens, NP on 09/05/2021 at 11:45 am. Pt verbalized understanding to arrive 15 minutes early and bring their CPAP. A letter with all of this information in it will be mailed to the pt as a reminder. I verified with the pt that the address we have on file is correct. Pt verbalized understanding of results. Pt had no questions at this time but was encouraged to call back if questions arise. I have sent the order to [facility] and have received confirmation that they have received the order. ? ?

## 2021-06-21 NOTE — Telephone Encounter (Signed)
-----   Message from Larey Seat, MD sent at 06/20/2021  5:28 PM EDT ----- ?There was barely any apnea above 5/h seen but hypoxia , prolonged and deep oxygen desaturations were clearly present. ?Treated with 1 liter oxygen nasal and documented for 4 hours, than documented the effect of discontinuation again at the end of study( severe) .  ?1. Very mild Obstructive Sleep Apnea (OSA) with strong REM sleep dependence, required oxygen supplementation at 1 liter/ min. ?? ?RECOMMENDATIONS: ?? ?1. 1 liter home use 0xygen at night per nasal canula I recommended.  ?2. CPAP use for such mild apnea is optional.  ?

## 2021-06-21 NOTE — Addendum Note (Signed)
Addended by: Darleen Crocker on: 06/21/2021 01:12 PM ? ? Modules accepted: Orders ? ?

## 2021-06-29 LAB — HM MAMMOGRAPHY: HM Mammogram: NORMAL (ref 0–4)

## 2021-06-29 LAB — HM PAP SMEAR: HM Pap smear: NORMAL

## 2021-07-07 ENCOUNTER — Ambulatory Visit: Payer: BC Managed Care – PPO | Admitting: Internal Medicine

## 2021-07-07 ENCOUNTER — Encounter: Payer: Self-pay | Admitting: Internal Medicine

## 2021-07-07 VITALS — BP 124/82 | HR 87 | Temp 97.7°F | Ht 68.5 in | Wt 240.8 lb

## 2021-07-07 DIAGNOSIS — E7800 Pure hypercholesterolemia, unspecified: Secondary | ICD-10-CM | POA: Diagnosis not present

## 2021-07-07 DIAGNOSIS — I700 Atherosclerosis of aorta: Secondary | ICD-10-CM

## 2021-07-07 DIAGNOSIS — K219 Gastro-esophageal reflux disease without esophagitis: Secondary | ICD-10-CM

## 2021-07-07 DIAGNOSIS — Z6836 Body mass index (BMI) 36.0-36.9, adult: Secondary | ICD-10-CM

## 2021-07-07 DIAGNOSIS — R7303 Prediabetes: Secondary | ICD-10-CM | POA: Diagnosis not present

## 2021-07-07 DIAGNOSIS — T7840XA Allergy, unspecified, initial encounter: Secondary | ICD-10-CM

## 2021-07-07 DIAGNOSIS — E6609 Other obesity due to excess calories: Secondary | ICD-10-CM

## 2021-07-07 MED ORDER — TRIAMCINOLONE ACETONIDE 40 MG/ML IJ SUSP
60.0000 mg | Freq: Once | INTRAMUSCULAR | Status: AC
  Administered 2021-07-07: 60 mg via INTRAMUSCULAR

## 2021-07-07 MED ORDER — HYDROXYZINE HCL 25 MG PO TABS: 25.0000 mg | ORAL_TABLET | Freq: Three times a day (TID) | ORAL | 0 refills | Status: DC | PRN

## 2021-07-07 MED ORDER — FAMOTIDINE 20 MG PO TABS: 20.0000 mg | ORAL_TABLET | Freq: Two times a day (BID) | ORAL | 1 refills | Status: DC

## 2021-07-07 MED ORDER — WEGOVY 0.25 MG/0.5ML ~~LOC~~ SOAJ: 0.2500 mg | SUBCUTANEOUS | 0 refills | Status: DC

## 2021-07-07 NOTE — Progress Notes (Signed)
[name],acting as a Education administrator for Tanya Greenland, MD.,have documented all relevant documentation on the behalf of Tanya Greenland, MD,as directed by  Tanya Greenland, MD while in the presence of Tanya Greenland, MD.  This visit occurred during the SARS-CoV-2 public health emergency.  Safety protocols were in place, including screening questions prior to the visit, additional usage of staff PPE, and extensive cleaning of exam room while observing appropriate contact time as indicated for disinfecting solutions.  Subjective:     Patient ID: [name] Age , female    DOB: Apr 25, 1970 , 51 y.o.   MRN: [id number]   Chief Complaint  Patient presents with   Hyperlipidemia   Prediabetes    HPI  She is here today for a cholesterol and prediabetes check. She is most concerned about a rash. She was recently treated by GI w/ diflucan and omeprazole. She is not sure which medication precipitated this red, itchy rash on her arms/abdomen. She recently returned from a trip w/ her husband, he does not have any similar symptoms.      Hyperlipidemia This is a chronic problem. The current episode started more than 1 year ago. The problem is uncontrolled. Recent lipid tests were reviewed and are high. Exacerbating diseases include obesity. Pertinent negatives include no focal sensory loss, myalgias or shortness of breath. Current antihyperlipidemic treatment includes diet change. The current treatment provides moderate improvement of lipids.    Past Medical History:  Diagnosis Date   Abdominal pain    Allergy    Allergy-induced asthma    Angina 05/2011   NO HEART PROBLEM WENT TO ED 05/22/11 FOR CP STRESS TEST WAS NEGATIVE    Ankle fracture    Chronic back pain greater than 3 months duration 01/17/2011   injured lumbar and posterior cervical neck in fall    Constipation    DDD (degenerative disc disease), cervical    Diverticulitis    Gallbladder problem    GERD (gastroesophageal reflux  disease)    H/O hiatal hernia    Hyperlipidemia    IBS (irritable bowel syndrome)    Nausea    Neuromuscular disorder (HCC)    LUMBA BACK  TROUBLE    Pre-diabetes    Rash    UNDER BIL BREASTS   Wrist fracture      Family History  Problem Relation Age of Onset   Diabetes Mother    Hyperlipidemia Mother    Cancer Mother    Sleep apnea Mother    Obesity Mother    Heart attack Father    Cancer Father        kidney and prostate   Hyperlipidemia Father    Other Father        gout and clogged arteries   Hypertension Father    Kidney disease Father    Alcoholism Father    Renal Disease Father    Hyperlipidemia Sister    Diabetes Sister    Diabetes Brother    Hyperlipidemia Brother    Coronary artery disease Brother    Heart disease Maternal Grandmother      Current Outpatient Medications:    albuterol (VENTOLIN HFA) 108 (90 Base) MCG/ACT inhaler, 2 puffs q 4 hours as neededFor shortness of breath Ok to sub with proair (covered rx), Disp: 18 g, Rfl: 1   Cholecalciferol (VITAMIN D3) 250 MCG (10000 UT) TABS, Take 1 capsule by mouth daily., Disp: , Rfl:    ELDERBERRY PO, Take by mouth. 1 per  day, Disp: , Rfl:    famotidine (PEPCID) 20 MG tablet, Take 1 tablet (20 mg total) by mouth 2 (two) times daily., Disp: 60 tablet, Rfl: 1   hydrOXYzine (ATARAX) 25 MG tablet, Take 1 tablet (25 mg total) by mouth 3 (three) times daily as needed., Disp: 30 tablet, Rfl: 0   NON FORMULARY, 2 capsules daily. Optimal - M Bone, nerves, muscles, Disp: , Rfl:    NON FORMULARY, 3 capsules daily. Rejuveniix Energy and Mental Alertness, Disp: , Rfl:    NON FORMULARY, 2 capsules at bedtime. Omega-Q Heart and Brain Function, Disp: , Rfl:    NON FORMULARY, 1 capsule. Vinali Heart, Eyes, Skin, Lungs, Disp: , Rfl:    NON FORMULARY, 2 capsules. Porbiotiix, Disp: , Rfl:    Semaglutide-Weight Management (WEGOVY) 0.25 MG/0.5ML SOAJ, Inject 0.25 mg into the skin once a week., Disp: 2 mL, Rfl: 0   zinc gluconate  50 MG tablet, Take 50 mg by mouth daily., Disp: , Rfl:    Allergies  Allergen Reactions   Amoxicillin Hives   Sulfa Antibiotics Hives   Penicillins Hives and Itching    Has patient had a PCN reaction causing immediate rash, facial/tongue/throat swelling, SOB or lightheadedness with hypotension: Yes Has patient had a PCN reaction causing severe rash involving mucus membranes or skin necrosis: No Has patient had a PCN reaction that required hospitalization No Has patient had a PCN reaction occurring within the last 10 years: Yes If all of the above answers are "NO", then may proceed with Cephalosporin use.    Tape     PLASTIC TAPE   (WHELPS, TORE SKIN)   Tramadol Hives     Review of Systems  Constitutional: Negative.   Respiratory: Negative.  Negative for shortness of breath.   Gastrointestinal: Negative.   Musculoskeletal:  Negative for myalgias.  Skin:  Positive for rash.  Neurological: Negative.   Psychiatric/Behavioral: Negative.      Today's Vitals   07/07/21 0833  BP: 124/82  Pulse: 87  Temp: 97.7 F (36.5 C)  Weight: 240 lb 12.8 oz (109.2 kg)  Height: 5' 8.5" (1.74 m)  PainSc: 0-No pain   Body mass index is 36.08 kg/m.  Wt Readings from Last 3 Encounters:  07/07/21 240 lb 12.8 oz (109.2 kg)  06/07/21 245 lb (111.1 kg)  05/10/21 249 lb (112.9 kg)     Objective:  Physical Exam Vitals and nursing note reviewed.  Constitutional:      Appearance: Normal appearance.  HENT:     Head: Normocephalic and atraumatic.  Eyes:     Extraocular Movements: Extraocular movements intact.  Cardiovascular:     Rate and Rhythm: Normal rate and regular rhythm.     Heart sounds: Normal heart sounds.  Pulmonary:     Effort: Pulmonary effort is normal.     Breath sounds: Normal breath sounds.  Musculoskeletal:     Cervical back: Normal range of motion.  Skin:    General: Skin is warm.     Findings: Erythema and rash present.     Comments: Scattered, erythematous blotches  on b/l UE and right side. No vesicular lesions noted.   Neurological:     General: No focal deficit present.     Mental Status: She is alert.  Psychiatric:        Mood and Affect: Mood normal.        Behavior: Behavior normal.     Assessment And Plan:     1. Pure hypercholesterolemia Comments:  I will check lpid panel, she is advised of presence of Ao Atherosclerosis and the need for following a heart healthy lifestyle.  - Insulin, random(561) - CMP14+EGFR - Lipid panel  2. Aortic atherosclerosis (HCC) Comments: Present on coronary CTA. Goal <70, less than 55 would be ideal.  - Insulin, random(561)  3. Prediabetes Comments: Her a1c has been elevated in the past. She is encouraged to limit her intake of sugary foods/beverages.  - Hemoglobin A1c - Insulin, random(561)  4. Gastroesophageal reflux disease without esophagitis Comments: She will d/c omeprazole. I will start pepcid twice daily, stop eating 3 hours prior to going to bed.   5. Allergic reaction, initial encounter Comments: Possibly due to omeprazole. I will give her Kenalog 29m IM x1, send pepcid 265mBID and hydroxyzine tid prn. She will let me know if her sx persist. - triamcinolone acetonide (KENALOG-40) injection 60 mg  6. Class 2 obesity due to excess calories with body mass index (BMI) of 36.0 to 36.9 in adult, unspecified whether serious comorbidity present We discussed the use of Wegovy for obesity. She denies family/personal h/o thyroid cancer. I will send rx 0.2528meekly to her pharmacy to start PA process. She is reminded to stop eating when full. She will rto in 4 weeks .  Possible side effects d/w patient.    Patient was given opportunity to ask questions. Patient verbalized understanding of the plan and was able to repeat key elements of the plan. All questions were answered to their satisfaction.   I, RobMaximino GreenlandD, have reviewed all documentation for this visit. The documentation on 07/07/21 for the  exam, diagnosis, procedures, and orders are all accurate and complete.   IF YOU HAVE BEEN REFERRED TO A SPECIALIST, IT MAY TAKE 1-2 WEEKS TO SCHEDULE/PROCESS THE REFERRAL. IF YOU HAVE NOT HEARD FROM US/SPECIALIST IN TWO WEEKS, PLEASE GIVE US [place]CALL AT [phone] X [phone].   THE PATIENT IS ENCOURAGED TO PRACTICE SOCIAL DISTANCING DUE TO THE COVID-19 PANDEMIC.

## 2021-07-08 ENCOUNTER — Encounter: Payer: Self-pay | Admitting: Internal Medicine

## 2021-07-08 LAB — CMP14+EGFR
ALT: 18 IU/L (ref 0–32)
AST: 21 IU/L (ref 0–40)
Albumin/Globulin Ratio: 1.6 (ref 1.2–2.2)
Albumin: 4.6 g/dL (ref 3.8–4.9)
Alkaline Phosphatase: 70 IU/L (ref 44–121)
BUN/Creatinine Ratio: 11 (ref 9–23)
BUN: 9 mg/dL (ref 6–24)
Bilirubin Total: 0.3 mg/dL (ref 0.0–1.2)
CO2: 26 mmol/L (ref 20–29)
Calcium: 9.7 mg/dL (ref 8.7–10.2)
Chloride: 105 mmol/L (ref 96–106)
Creatinine, Ser: 0.81 mg/dL (ref 0.57–1.00)
Globulin, Total: 2.8 g/dL (ref 1.5–4.5)
Glucose: 89 mg/dL (ref 70–99)
Potassium: 4.7 mmol/L (ref 3.5–5.2)
Sodium: 146 mmol/L — ABNORMAL HIGH (ref 134–144)
Total Protein: 7.4 g/dL (ref 6.0–8.5)
eGFR: 87 mL/min/{1.73_m2} (ref >59)

## 2021-07-08 LAB — HEMOGLOBIN A1C
Est. average glucose Bld gHb Est-mCnc: 123 mg/dL
Hgb A1c MFr Bld: 5.9 % — ABNORMAL HIGH (ref 4.8–5.6)

## 2021-07-08 LAB — LIPID PANEL
Chol/HDL Ratio: 4.8 ratio — ABNORMAL HIGH (ref 0.0–4.4)
Cholesterol, Total: 221 mg/dL — ABNORMAL HIGH (ref 100–199)
HDL: 46 mg/dL (ref >39)
LDL Chol Calc (NIH): 154 mg/dL — ABNORMAL HIGH (ref 0–99)
Triglycerides: 118 mg/dL (ref 0–149)
VLDL Cholesterol Cal: 21 mg/dL (ref 5–40)

## 2021-07-08 LAB — INSULIN, RANDOM: INSULIN: 13.5 u[IU]/mL (ref 2.6–24.9)

## 2021-07-10 ENCOUNTER — Other Ambulatory Visit: Payer: Self-pay | Admitting: Internal Medicine

## 2021-07-10 MED ORDER — ATORVASTATIN CALCIUM 20 MG PO TABS: ORAL_TABLET | ORAL | 11 refills | Status: DC

## 2021-07-11 ENCOUNTER — Telehealth: Payer: Self-pay

## 2021-07-11 NOTE — Telephone Encounter (Signed)
PA sent to plan for [facility] using CMM

## 2021-08-09 ENCOUNTER — Telehealth: Payer: Self-pay | Admitting: *Deleted

## 2021-08-09 DIAGNOSIS — G4739 Other sleep apnea: Secondary | ICD-10-CM

## 2021-08-09 DIAGNOSIS — R0902 Hypoxemia: Secondary | ICD-10-CM

## 2021-08-09 NOTE — Telephone Encounter (Signed)
Pt completed ONO on CPAP 08/02/21-08/03/21. Duration of study: 7hr 80mn, 56 sec. Total time oxygen was 89% or below was 1hr 274m 31sec. Lowest oxygen dropped was 75%.   These results indicate that oxygen is needed to be bled into pt CPAP.  She will need to come in for titration study in lab due to Medicare requirements first. Will send these results to Dr. DoBrett Fairyo review. Results are in review bin for her review as well.

## 2021-08-09 NOTE — Telephone Encounter (Signed)
Dr Mamie Levers reviewed the ONO on CPAP results and she does agree that the amount of time the oxygen was spent below 89% would justify needing oxygen. Pt would have to complete a cpap titration to titrate her with oxygen. I will reach out to advise the patient of this information

## 2021-08-10 NOTE — Telephone Encounter (Signed)
Called the patient and advised of the low oxygen findings on the ono on CPAP report.  Dr Dohmeier recommends that we completed a titration study here in the lab where we will work with her cpap to determine if oxygen is needed and if so they can add oxygen in the lab and and titrate where it needs to be. Pt verbalized understanding and agreeable to this plan. Advised orders will be sent to the sleep lab and they will work on authorization for this. Pt verbalized understanding.

## 2021-08-11 ENCOUNTER — Telehealth: Payer: Self-pay | Admitting: Neurology

## 2021-08-11 NOTE — Telephone Encounter (Signed)
CPAP Titration- [employer] [place] no auth req. Patient is scheduled at [facility] for 08/24/21 at 9 pm.

## 2021-08-11 NOTE — Telephone Encounter (Signed)
LVM for pt to call back to schedule  [employer] [place] no auth req  

## 2021-08-24 ENCOUNTER — Ambulatory Visit (INDEPENDENT_AMBULATORY_CARE_PROVIDER_SITE_OTHER): Payer: BC Managed Care – PPO | Admitting: Neurology

## 2021-08-24 DIAGNOSIS — G4739 Other sleep apnea: Secondary | ICD-10-CM

## 2021-08-24 DIAGNOSIS — G4731 Primary central sleep apnea: Secondary | ICD-10-CM

## 2021-08-24 DIAGNOSIS — R0902 Hypoxemia: Secondary | ICD-10-CM

## 2021-08-30 ENCOUNTER — Telehealth: Payer: Self-pay | Admitting: *Deleted

## 2021-08-30 DIAGNOSIS — G4733 Obstructive sleep apnea (adult) (pediatric): Secondary | ICD-10-CM | POA: Insufficient documentation

## 2021-08-30 DIAGNOSIS — G4739 Other sleep apnea: Secondary | ICD-10-CM | POA: Insufficient documentation

## 2021-08-30 DIAGNOSIS — R0902 Hypoxemia: Secondary | ICD-10-CM | POA: Insufficient documentation

## 2021-08-30 NOTE — Telephone Encounter (Signed)
Called pt. Relayed results per Dr. Edwena Felty note. Pt verbalized understanding. Sent updated order to [place] via fax: [phone].  She also mentioned she has a whistle noise coming from machine that she is going to discuss with [facility].

## 2021-08-30 NOTE — Addendum Note (Signed)
Addended by: Larey Seat on: 08/30/2021 03:02 PM   Modules accepted: Orders

## 2021-08-30 NOTE — Telephone Encounter (Signed)
-----   Message from Larey Seat, MD sent at 08/30/2021  3:01 PM EDT ----- DIAGNOSIS 1. Residual Central Sleep Apnea controlled under 10 cm water CPAP, as was hypoxia. The patient used a Medium AirFit N30i nasal cushion w/chin strap, no oxygen was needed.     PLANS/RECOMMENDATIONS: Continue CPAP at 10 cm water pressure with a Medium AirFit N30i nasal cushion w/chin strap. No oxygen needed to be added.  The patient may use auto CPAP at a range from 7 through 12 cm water

## 2021-08-30 NOTE — Procedures (Signed)
PATIENT'S NAME:  [name], [name] DOB:      1970-03-08      MR#:    [id number]     DATE OF RECORDING: 08/24/2021 M. Pecolia Ades M.D.:  Glendale Chard MD Study Performed:   Titration to positive airway therapy  HISTORY:  [name] presents to be evaluated if she needs supplemental oxygen in addition to CPAP, based on an at home- ONO while on PAP . She has a past medical history of Abdominal pain, Allergy, Allergy-induced asthma, Angina (05/2011), Ankle fracture, Chronic back pain greater than 3 months duration (01/17/2011), Constipation, DDD (degenerative disc disease), cervical, Diverticulitis, Gallbladder problem, GERD (gastroesophageal reflux disease), H/O hiatal hernia, Hyperlipidemia, IBS (irritable bowel syndrome), Nausea, Neuromuscular disorder ([name]), Pre-diabetes, Rash, and OSA.  The patient endorsed the Epworth Sleepiness Scale at 12/24 points.   The patient's weight 245 pounds with a height of 68 (inches), resulting in a BMI of 37.1 kg/m2. The patient's neck circumference measured 17.5 inches.  CURRENT MEDICATIONS: Ventolin HFA, Vitamin D3, Elderberry, Lopressor, Zinc gluconate    PROCEDURE:  This is a multichannel digital polysomnogram utilizing the SomnoStar 11.2 system.  Electrodes and sensors were applied and monitored per AASM Specifications.   EEG, EOG, Chin and Limb EMG, were sampled at 200 Hz.  ECG, Snore and Nasal Pressure, Thermal Airflow, Respiratory Effort, CPAP Flow and Pressure, Oximetry was sampled at 50 Hz. Digital video and audio were recorded.       CPAP was initiated at 5 cmH20 with an AirFit N30 I and chin strap, under heated humidity per AASM standards . CPAP pressure was advanced to 10 cmH20 because of hypopneas, apneas and desaturations.  At a PAP pressure of 10 cmH20, there was a reduction of the AHI to 3.3 with improvement of sleep apnea.  Lights Out was at 22:15 and Lights On at 04:49. Total recording time (TRT) was 394.5 minutes, with a total sleep  time (TST) of 281 minutes. The patient's sleep latency was 77.5 minutes. REM latency was 97 minutes.  The sleep efficiency was 71.2 %.    SLEEP ARCHITECTURE: WASO (Wake after sleep onset)  was 45 minutes.  There were 27 minutes in Stage N1, 179.5 minutes Stage N2, 28 minutes Stage N3 and 46.5 minutes in Stage REM.  The percentage of Stage N1 was 9.6%, Stage N2 was 63.9%, Stage N3 was 10.% and Stage R (REM sleep) was 16.5%.    RESPIRATORY ANALYSIS:  There was a total of 22 respiratory events: 0 obstructive apneas, 10 central apneas and 2 mixed apneas with a total of 12 apneas and an apnea index (AI) of 2.6 /hour. There were 10 hypopneas with a hypopnea index of 2.1/hour.   The total APNEA/HYPOPNEA INDEX  (AHI) was 4.7 /hour and the total RESPIRATORY DISTURBANCE INDEX was 4.7 /hour  1 events occurred in REM sleep and 21 events in NREM. The REM AHI was 1.3 /hour versus a non-REM AHI of 5.4 /hour.  The patient spent 123.5 minutes of total sleep time in the supine position and 158 minutes in non-supine. The supine AHI was 4.3, versus a non-supine AHI of 5.0.  OXYGEN SATURATION & C02:  The baseline 02 saturation was 96%, with the lowest being 86%. Time spent below 89% saturation equaled 1 minutes.  PERIODIC LIMB MOVEMENTS:  The patient had a total of 0 Periodic Limb Movements.  The arousals were noted as: 28 were spontaneous, 0 were associated with PLMs, 9 were associated with respiratory events.  Audio and  video analysis did not show any abnormal or unusual movements, behaviors, phonations or vocalizations.    The patient took bathroom breaks. EKG : in sinus rhythm (NSR).   Marland Kitchen  DIAGNOSIS Residual Central Sleep Apnea controlled under 10 cm water CPAP, as was hypoxia. The patient used a Medium AirFit N30i nasal cushion w/chin strap, no oxygen was needed.     PLANS/RECOMMENDATIONS: Continue CPAP at 10 cm water pressure with a Medium AirFit N30i nasal cushion w/chin strap. No oxygen needed to be  added.    DISCUSSION: A follow up appointment will be scheduled in the Sleep Clinic at [facility].   Please call [phone] with any questions.      I certify that I have reviewed the entire raw data recording prior to the issuance of this report in accordance with the Standards of Accreditation of the American Academy of Sleep Medicine (AASM)   Larey Seat, M.D. Medical Director, [facility] [employer] [employer] of  Neurology and Sleep Medicine(Neurology and Sleep Medicine)

## 2021-09-01 ENCOUNTER — Ambulatory Visit (HOSPITAL_BASED_OUTPATIENT_CLINIC_OR_DEPARTMENT_OTHER): Payer: BC Managed Care – PPO | Admitting: Cardiovascular Disease

## 2021-09-01 ENCOUNTER — Encounter (HOSPITAL_BASED_OUTPATIENT_CLINIC_OR_DEPARTMENT_OTHER): Payer: Self-pay | Admitting: Cardiovascular Disease

## 2021-09-01 DIAGNOSIS — R0789 Other chest pain: Secondary | ICD-10-CM | POA: Diagnosis not present

## 2021-09-01 DIAGNOSIS — E7800 Pure hypercholesterolemia, unspecified: Secondary | ICD-10-CM

## 2021-09-01 DIAGNOSIS — I700 Atherosclerosis of aorta: Secondary | ICD-10-CM | POA: Diagnosis not present

## 2021-09-01 DIAGNOSIS — G4739 Other sleep apnea: Secondary | ICD-10-CM | POA: Diagnosis not present

## 2021-09-01 HISTORY — DX: Atherosclerosis of aorta: I70.0

## 2021-09-01 NOTE — Patient Instructions (Signed)
Medication Instructions:  Your physician recommends that you continue on your current medications as directed. Please refer to the Current Medication list given to you today.   *If you need a refill on your cardiac medications before your next appointment, please call your pharmacy*  Lab Work: LP/CMET TODAY   If you have labs (blood work) drawn today and your tests are completely normal, you will receive your results only by: [place] (if you have MyChart) OR A paper copy in the mail If you have any lab test that is abnormal or we need to change your treatment, we will call you to review the results.  Testing/Procedures: NONE  Follow-Up: At [facility], you and your health needs are our priority.  As part of our continuing mission to provide you with exceptional heart care, we have created designated Provider Care Teams.  These Care Teams include your primary Cardiologist (physician) and Advanced Practice Providers (APPs -  Physician Assistants and Nurse Practitioners) who all work together to provide you with the care you need, when you need it.  We recommend signing up for the patient portal called "MyChart".  Sign up information is provided on this After Visit Summary.  MyChart is used to connect with patients for Virtual Visits (Telemedicine).  Patients are able to view lab/test results, encounter notes, upcoming appointments, etc.  Non-urgent messages can be sent to your provider as well.   To learn more about what you can do with MyChart, go to [email or web address].    Your next appointment:   12 month(s)  The format for your next appointment:   In Person  Provider:   Skeet Latch, MD   [place]

## 2021-09-01 NOTE — Progress Notes (Signed)
Cardiology Office Note   Date:  09/01/2021   ID:  [name], DOB Jan 26, 1971, MRN [id number]  PCP:  Glendale Chard, MD  Cardiologist:   Skeet Latch, MD   No chief complaint on file.     History of Present Illness: [name] is a 51 y.o. female with hyperlipidemia, GERD, OSA on CPAP, aortic atherosclerosis, and prediabetes who is here for follow up, she was first seen 04/2021 for chest pain at the request of Glendale Chard, MD. She saw Dr. Baird Cancer 03/2021 and reported chest pain.  EKG was unremarkable.  Given her cardiac risk factors she was referred to cardiology.  She was also referred for a sleep study due to snoring and PND.  She also reported palpitations when laying down. She had a coronary CTA 05/2021 with no CAD. She did have some aortic atherosclerosis. She wore monitor up to five beats of SVT and rare PACs and PVCs.   Today, she has been concerned about her CPAP and oxygen levels. She reports feeling the occasional extra beats. She has been going to therapy to manage her breathing, and can tell the difference when she wears her CPAP. She has been lost 85 lbs by changing her diet and exercising. No longer eats dairy products. When taking the Crestor, she experienced symptoms of muscle aches. Remains compliant with her medications and supplement. Of note, her father passed away last year and has been going to counseling sessions to manage. Her mother had pancreatic cancer. The patient denies chest pain, shortness of breath, nocturnal dyspnea, orthopnea or peripheral edema.  She denies lightheadedness or syncope.    Past Medical History:  Diagnosis Date   Abdominal pain    Allergy    Allergy-induced asthma    Angina 05/2011   NO HEART PROBLEM WENT TO ED 05/22/11 FOR CP STRESS TEST WAS NEGATIVE    Ankle fracture    Aortic atherosclerosis ([place]) 09/01/2021   Chronic back pain greater than 3 months duration 01/17/2011   injured lumbar and posterior cervical neck in  fall    Constipation    DDD (degenerative disc disease), cervical    Diverticulitis    Gallbladder problem    GERD (gastroesophageal reflux disease)    H/O hiatal hernia    Hyperlipidemia    IBS (irritable bowel syndrome)    Nausea    Neuromuscular disorder ([place])    LUMBA BACK  TROUBLE    Pre-diabetes    Rash    UNDER BIL BREASTS   Wrist fracture     Past Surgical History:  Procedure Laterality Date   BREAST CYST EXCISION Left ~ 1983   CHOLECYSTECTOMY  08/2010   COLONOSCOPY  07/2010   Removal of 3 polyps   DILATATION & CURETTAGE/HYSTEROSCOPY WITH MYOSURE N/A 09/03/2015   Procedure: [place];  Surgeon: Servando Salina, MD;  Location: [facility];  Service: Gynecology;  Laterality: N/A;   FRACTURE SURGERY  06/12/11   patient denies   HERNIA REPAIR  6734; 02/9377   umbilical corrected during tubal ligation; VHR w/mesh   TUBAL LIGATION  12/1995   VENTRAL HERNIA REPAIR  06/12/2011   Procedure: LAPAROSCOPIC VENTRAL HERNIA;  Surgeon: Gwenyth Ober, MD;  Location: [facility];  Service: General;  Laterality: N/A;   WISDOM TOOTH EXTRACTION  1990's     Current Outpatient Medications  Medication Sig Dispense Refill   albuterol (VENTOLIN HFA) 108 (90 Base) MCG/ACT inhaler 2 puffs q 4 hours as neededFor shortness  of breath Ok to sub with proair (covered rx) 18 g 1   atorvastatin (LIPITOR) 20 MG tablet One tab po M-F, skip weekends 30 tablet 11   Cholecalciferol (VITAMIN D3) 250 MCG (10000 UT) TABS Take 1 capsule by mouth daily.     ELDERBERRY PO Take by mouth. 1 per day     famotidine (PEPCID) 20 MG tablet Take 20 mg by mouth as needed for heartburn or indigestion.     NON FORMULARY 2 capsules daily. Optimal - M Bone, nerves, muscles     NON FORMULARY 3 capsules daily. Rejuveniix Energy and Mental Alertness     NON FORMULARY 2 capsules at bedtime. Omega-Q Heart and Brain Function     NON FORMULARY 1 capsule. Vinali Heart, Eyes, Skin, Lungs     NON  FORMULARY 2 capsules. Porbiotiix     zinc gluconate 50 MG tablet Take 50 mg by mouth daily.     Semaglutide-Weight Management (WEGOVY) 0.25 MG/0.5ML SOAJ Inject 0.25 mg into the skin once a week. (Patient not taking: Reported on 09/01/2021) 2 mL 0   No current facility-administered medications for this visit.    Allergies:   Amoxicillin, Sulfa antibiotics, Penicillins, Tape, and Tramadol    Social History:  The patient  reports that she has never smoked. She has never used smokeless tobacco. She reports current alcohol use. She reports that she does not use drugs.   Family History:  The patient's family history includes Alcoholism in her father; Cancer in her father and mother; Coronary artery disease in her brother; Diabetes in her brother, mother, and sister; Heart attack in her father; Heart disease in her maternal grandmother; Hyperlipidemia in her brother, father, mother, and sister; Hypertension in her father; Kidney disease in her father; Obesity in her mother; Other in her father; Renal Disease in her father; Sleep apnea in her mother.    ROS:  Please see the history of present illness. (+) Palpitations (+) Stress All other systems are reviewed and negative.     PHYSICAL EXAM: VS:  BP 120/82 (BP Location: Right Arm, Patient Position: Sitting, Cuff Size: Large)   Pulse 66   Ht 5' 8.5" (1.74 m)   Wt 235 lb 4.8 oz (106.7 kg)   LMP 11/05/2015 Comment: tubal ligation  SpO2 98%   BMI 35.26 kg/m  , BMI Body mass index is 35.26 kg/m. GENERAL:  Well appearing HEENT:  Pupils equal round and reactive, fundi not visualized, oral mucosa unremarkable NECK:  No jugular venous distention, waveform within normal limits, carotid upstroke brisk and symmetric, no bruits, no thyromegaly LUNGS:  Clear to auscultation bilaterally HEART:  RRR.  PMI not displaced or sustained,S1 and S2 within normal limits, no S3, no S4, no clicks, no rubs, no murmurs ABD:  Flat, positive bowel sounds normal in  frequency in pitch, no bruits, no rebound, no guarding, no midline pulsatile mass, no hepatomegaly, no splenomegaly EXT:  2 plus pulses throughout, no edema, no cyanosis no clubbing SKIN:  No rashes no nodules NEURO:  Cranial nerves II through XII grossly intact, motor grossly intact throughout PSYCH:  Cognitively intact, oriented to person place and time    EKG:  EKG is personally reviewed.  05/10/2021 EKG: Sinus Rhythm.  Rate 78 bpm   Recent Labs: 10/26/2020: Hemoglobin 14.4; Platelets 213 07/07/2021: ALT 18; BUN 9; Creatinine, Ser 0.81; Potassium 4.7; Sodium 146    Lipid Panel    Component Value Date/Time   CHOL 221 (H) 07/07/2021 8242  TRIG 118 07/07/2021 0926   HDL 46 07/07/2021 0926   CHOLHDL 4.8 (H) 07/07/2021 0926   LDLCALC 154 (H) 07/07/2021 0926      Wt Readings from Last 3 Encounters:  09/01/21 235 lb 4.8 oz (106.7 kg)  07/07/21 240 lb 12.8 oz (109.2 kg)  06/07/21 245 lb (111.1 kg)      ASSESSMENT AND PLAN:  Aortic atherosclerosis (HCC) Noted on coronary CT-A.  She is on atorvastatin.  LDL goal <70.  Cotninue with diet and exercise. She isn't tolerating atorvastatin and had myalgias on rosuvastatin.  Will get her set up with our PharmD to consider Repatha.  Other sleep apnea She is working with neurology to find the right CPAP mask.  She feels better when she wears her mask and has been consistent.  Atypical chest pain Symptoms resolved.  No coronary calcification on coronary CT-A in 2023.  Pure hypercholesterolemia Lipids are uncontrolled.  She has been doing a really good job of weight loss and trying to start limiting animal products.  We will repeat lipids and a CMP today.  She has not tolerated statins well.  We will have her see our Pharm.D. to consider Repatha or inclisiran.  She struggles with remembering to take her oral medications on a daily basis.   Current medicines are reviewed at length with the patient today.  The patient does not have  concerns regarding medicines.  The following changes have been made:  no change  Labs/ tests ordered today include:   Orders Placed This Encounter  Procedures   Lipid panel   Comprehensive metabolic panel   AMB Referral to [facility] Pharm-D     Disposition:   FU with Bernedette Auston C. Oval Linsey, MD, [facility] in 1 year.   Time spent: 40 minutes-Greater than 50% of this time was spent in counseling, explanation of diagnosis, planning of further management, and coordination of care.   I,Tinashe Williams,acting as a Education administrator for [place], MD.,have documented all relevant documentation on the behalf of Skeet Latch, MD,as directed by  Skeet Latch, MD while in the presence of Skeet Latch, MD.   I, [place] Oval Linsey, MD have reviewed all documentation for this visit.  The documentation of the exam, diagnosis, procedures, and orders on 09/01/2021 are all accurate and complete.

## 2021-09-01 NOTE — Assessment & Plan Note (Addendum)
Noted on coronary CT-A.  She is on atorvastatin.  LDL goal <70.  Cotninue with diet and exercise. She isn't tolerating atorvastatin and had myalgias on rosuvastatin.  Will get her set up with our PharmD to consider Repatha.

## 2021-09-01 NOTE — Assessment & Plan Note (Signed)
Lipids are uncontrolled.  She has been doing a really good job of weight loss and trying to start limiting animal products.  We will repeat lipids and a CMP today.  She has not tolerated statins well.  We will have her see our Pharm.D. to consider Repatha or inclisiran.  She struggles with remembering to take her oral medications on a daily basis.

## 2021-09-01 NOTE — Assessment & Plan Note (Signed)
Symptoms resolved.  No coronary calcification on coronary CT-A in 2023.

## 2021-09-01 NOTE — Assessment & Plan Note (Signed)
She is working with neurology to find the right CPAP mask.  She feels better when she wears her mask and has been consistent.

## 2021-09-02 LAB — COMPREHENSIVE METABOLIC PANEL
ALT: 16 IU/L (ref 0–32)
AST: 21 IU/L (ref 0–40)
Albumin/Globulin Ratio: 1.7 (ref 1.2–2.2)
Albumin: 4.5 g/dL (ref 3.8–4.9)
Alkaline Phosphatase: 66 IU/L (ref 44–121)
BUN/Creatinine Ratio: 12 (ref 9–23)
BUN: 10 mg/dL (ref 6–24)
Bilirubin Total: 0.4 mg/dL (ref 0.0–1.2)
CO2: 25 mmol/L (ref 20–29)
Calcium: 9.8 mg/dL (ref 8.7–10.2)
Chloride: 101 mmol/L (ref 96–106)
Creatinine, Ser: 0.81 mg/dL (ref 0.57–1.00)
Globulin, Total: 2.6 g/dL (ref 1.5–4.5)
Glucose: 93 mg/dL (ref 70–99)
Potassium: 4.4 mmol/L (ref 3.5–5.2)
Sodium: 140 mmol/L (ref 134–144)
Total Protein: 7.1 g/dL (ref 6.0–8.5)
eGFR: 87 mL/min/{1.73_m2} (ref >59)

## 2021-09-02 LAB — LIPID PANEL
Chol/HDL Ratio: 3.1 ratio (ref 0.0–4.4)
Cholesterol, Total: 185 mg/dL (ref 100–199)
HDL: 59 mg/dL (ref >39)
LDL Chol Calc (NIH): 114 mg/dL — ABNORMAL HIGH (ref 0–99)
Triglycerides: 66 mg/dL (ref 0–149)
VLDL Cholesterol Cal: 12 mg/dL (ref 5–40)

## 2021-09-05 ENCOUNTER — Encounter: Payer: BC Managed Care – PPO | Admitting: Adult Health

## 2021-09-07 NOTE — Patient Instructions (Addendum)
Please continue using your CPAP regularly. While your insurance requires that you use CPAP at least 4 hours each night on 70% of the nights, I recommend, that you not skip any nights and use it throughout the night if you can. Getting used to CPAP and staying with the treatment long term does take time and patience and discipline. Untreated obstructive sleep apnea when it is moderate to severe can have an adverse impact on cardiovascular health and raise her risk for heart disease, arrhythmias, hypertension, congestive heart failure, stroke and diabetes. Untreated obstructive sleep apnea causes sleep disruption, nonrestorative sleep, and sleep deprivation. This can have an impact on your day to day functioning and cause daytime sleepiness and impairment of cognitive function, memory loss, mood disturbance, and problems focussing. Using CPAP regularly can improve these symptoms.  You are doing great! Continue working to find right fit of headgear. Please reach out to [facility] if you need anything. Try Biotene products for dry mouth.   Follow up with me in 1 year
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
## 2021-09-08 ENCOUNTER — Ambulatory Visit: Payer: BC Managed Care – PPO | Admitting: Family Medicine

## 2021-09-08 ENCOUNTER — Encounter: Payer: Self-pay | Admitting: Family Medicine

## 2021-09-08 VITALS — BP 131/86 | HR 67 | Ht 68.5 in | Wt 236.5 lb

## 2021-09-08 DIAGNOSIS — G4739 Other sleep apnea: Secondary | ICD-10-CM

## 2021-09-13 ENCOUNTER — Encounter (HOSPITAL_BASED_OUTPATIENT_CLINIC_OR_DEPARTMENT_OTHER): Payer: Self-pay | Admitting: Cardiovascular Disease

## 2021-09-13 ENCOUNTER — Ambulatory Visit: Payer: BC Managed Care – PPO

## 2021-09-13 NOTE — Progress Notes (Deleted)
09/13/2021 MAURICE RAMSEUR 1970-10-31 [id number]   HPI:  [name] is a 51 y.o. female patient of Dr Oval Linsey, who presents today for a lipid clinic evaluation.  See pertinent past medical history below.  Past Medical History: CAD Aortic atherosclerosis seen on chest CT  OSA On CPAP, followed by neurology  Pre-diabetes 5/23 A1c 5.9  GERD Uses famotidine prn   Current Medications: atorvastatin 20 mg MWF - not tolerating?  Cholesterol Goals: LDL < 70   Intolerant/previously tried: rosuvastatin - myalgias  Family history:   Diet:   Exercise:    Labs: 7/23: TC 185, TG 66, HDL 59, LDL 114   Current Outpatient Medications  Medication Sig Dispense Refill   albuterol (VENTOLIN HFA) 108 (90 Base) MCG/ACT inhaler 2 puffs q 4 hours as neededFor shortness of breath Ok to sub with proair (covered rx) 18 g 1   atorvastatin (LIPITOR) 20 MG tablet One tab po M-F, skip weekends 30 tablet 11   Cholecalciferol (VITAMIN D3) 250 MCG (10000 UT) TABS Take 1 capsule by mouth daily.     ELDERBERRY PO Take by mouth. 1 per day     famotidine (PEPCID) 20 MG tablet Take 20 mg by mouth as needed for heartburn or indigestion.     NON FORMULARY 2 capsules daily. Optimal - M Bone, nerves, muscles     NON FORMULARY 3 capsules daily. Rejuveniix Energy and Mental Alertness     NON FORMULARY 2 capsules at bedtime. Omega-Q Heart and Brain Function     NON FORMULARY 1 capsule. Vinali Heart, Eyes, Skin, Lungs     NON FORMULARY 2 capsules. Porbiotiix     zinc gluconate 50 MG tablet Take 50 mg by mouth daily.     No current facility-administered medications for this visit.    Allergies  Allergen Reactions   Amoxicillin Hives   Sulfa Antibiotics Hives   Penicillins Hives and Itching    Has patient had a PCN reaction causing immediate rash, facial/tongue/throat swelling, SOB or lightheadedness with hypotension: Yes Has patient had a PCN reaction causing severe rash involving mucus  membranes or skin necrosis: No Has patient had a PCN reaction that required hospitalization No Has patient had a PCN reaction occurring within the last 10 years: Yes If all of the above answers are "NO", then may proceed with Cephalosporin use.    Tape     PLASTIC TAPE   (WHELPS, TORE SKIN)   Tramadol Hives    Past Medical History:  Diagnosis Date   Abdominal pain    Allergy    Allergy-induced asthma    Angina 05/2011   NO HEART PROBLEM WENT TO ED 05/22/11 FOR CP STRESS TEST WAS NEGATIVE    Ankle fracture    Aortic atherosclerosis ([place]) 09/01/2021   Chronic back pain greater than 3 months duration 01/17/2011   injured lumbar and posterior cervical neck in fall    Constipation    DDD (degenerative disc disease), cervical    Diverticulitis    Gallbladder problem    GERD (gastroesophageal reflux disease)    H/O hiatal hernia    Hyperlipidemia    IBS (irritable bowel syndrome)    Nausea    Neuromuscular disorder (HCC)    LUMBA BACK  TROUBLE    Pre-diabetes    Rash    UNDER BIL BREASTS   Wrist fracture     Last menstrual period 11/05/2015.   No problem-specific Assessment & Plan notes found for this encounter.  Tommy Medal PharmD CPP [facility] [address] [place], [place] [address] [phone]

## 2021-09-29 NOTE — Telephone Encounter (Signed)
09/05/2021 visit with Dr Oval Linsey

## 2021-10-10 ENCOUNTER — Encounter: Payer: Self-pay | Admitting: *Deleted

## 2021-11-03 ENCOUNTER — Encounter: Payer: Self-pay | Admitting: Internal Medicine

## 2021-11-03 ENCOUNTER — Ambulatory Visit (INDEPENDENT_AMBULATORY_CARE_PROVIDER_SITE_OTHER): Payer: BC Managed Care – PPO | Admitting: Internal Medicine

## 2021-11-03 VITALS — BP 124/80 | HR 86 | Temp 98.4°F | Ht 68.5 in | Wt 232.4 lb

## 2021-11-03 DIAGNOSIS — Z6834 Body mass index (BMI) 34.0-34.9, adult: Secondary | ICD-10-CM

## 2021-11-03 DIAGNOSIS — R7303 Prediabetes: Secondary | ICD-10-CM | POA: Diagnosis not present

## 2021-11-03 DIAGNOSIS — Z23 Encounter for immunization: Secondary | ICD-10-CM

## 2021-11-03 DIAGNOSIS — E6609 Other obesity due to excess calories: Secondary | ICD-10-CM | POA: Diagnosis not present

## 2021-11-03 DIAGNOSIS — E7800 Pure hypercholesterolemia, unspecified: Secondary | ICD-10-CM | POA: Diagnosis not present

## 2021-11-03 DIAGNOSIS — L304 Erythema intertrigo: Secondary | ICD-10-CM | POA: Diagnosis not present

## 2021-11-03 DIAGNOSIS — Z0000 Encounter for general adult medical examination without abnormal findings: Secondary | ICD-10-CM | POA: Diagnosis not present

## 2021-11-03 MED ORDER — NYSTATIN 100000 UNIT/GM EX POWD: 1.0000 | Freq: Three times a day (TID) | CUTANEOUS | 0 refills | Status: DC

## 2021-11-03 NOTE — Progress Notes (Signed)
[name],acting as a Education administrator for [name], MD.,have documented all relevant documentation on the behalf of [name], MD,as directed by  [name], MD while in the presence of [name], MD.   Subjective:     Patient ID: [name] , female    DOB: December 19, 1970 , 51 y.o.   MRN: [id number]   Chief Complaint  Patient presents with   Annual Exam    HPI  Patient is here for physical. She is followed by [facility] for her pap smears. Her last one was in 2020.      Past Medical History:  Diagnosis Date   Abdominal pain    Allergy    Allergy-induced asthma    Angina 05/2011   NO HEART PROBLEM WENT TO ED 05/22/11 FOR CP STRESS TEST WAS NEGATIVE    Ankle fracture    Aortic atherosclerosis (HCC) 09/01/2021   Chronic back pain greater than 3 months duration 01/17/2011   injured lumbar and posterior cervical neck in fall    Constipation    DDD (degenerative disc disease), cervical    Diverticulitis    Gallbladder problem    GERD (gastroesophageal reflux disease)    H/O hiatal hernia    Hyperlipidemia    IBS (irritable bowel syndrome)    Nausea    Neuromuscular disorder ([place])    LUMBA BACK  TROUBLE    Pre-diabetes    Rash    UNDER BIL BREASTS   Wrist fracture      Family History  Problem Relation Nicholson of Onset   Diabetes Mother    Hyperlipidemia Mother    Cancer Mother    Sleep apnea Mother    Obesity Mother    Heart attack Father    Cancer Father        kidney and prostate   Hyperlipidemia Father    Other Father        gout and clogged arteries   Hypertension Father    Kidney disease Father    Alcoholism Father    Renal Disease Father    Hyperlipidemia Sister    Diabetes Sister    Diabetes Brother    Hyperlipidemia Brother    Coronary artery disease Brother    Heart disease Maternal Grandmother      Current Outpatient Medications:    atorvastatin (LIPITOR) 20 MG tablet, One tab po M-F, skip weekends, Disp: 30  tablet, Rfl: 11   nystatin powder, Apply 1 Application topically 3 (three) times daily., Disp: 15 g, Rfl: 0   albuterol (VENTOLIN HFA) 108 (90 Base) MCG/ACT inhaler, 2 puffs q 4 hours as neededFor shortness of breath Ok to sub with proair (covered rx) (Patient not taking: Reported on 11/03/2021), Disp: 18 g, Rfl: 1   Cholecalciferol (VITAMIN D3) 250 MCG (10000 UT) TABS, Take 1 capsule by mouth daily. (Patient not taking: Reported on 11/03/2021), Disp: , Rfl:    ELDERBERRY PO, Take by mouth. 1 per day (Patient not taking: Reported on 11/03/2021), Disp: , Rfl:    famotidine (PEPCID) 20 MG tablet, Take 20 mg by mouth as needed for heartburn or indigestion. (Patient not taking: Reported on 11/03/2021), Disp: , Rfl:    NON FORMULARY, 2 capsules daily. Optimal - M Bone, nerves, muscles (Patient not taking: Reported on 11/03/2021), Disp: , Rfl:    NON FORMULARY, 3 capsules daily. Rejuveniix Energy and Mental Alertness (Patient not taking: Reported on 11/03/2021), Disp: , Rfl:    NON FORMULARY,  2 capsules at bedtime. Omega-Q Heart and Brain Function (Patient not taking: Reported on 11/03/2021), Disp: , Rfl:    NON FORMULARY, 1 capsule. Vinali Heart, Eyes, Skin, Lungs (Patient not taking: Reported on 11/03/2021), Disp: , Rfl:    NON FORMULARY, 2 capsules. Porbiotiix (Patient not taking: Reported on 11/03/2021), Disp: , Rfl:    zinc gluconate 50 MG tablet, Take 50 mg by mouth daily. (Patient not taking: Reported on 11/03/2021), Disp: , Rfl:    Allergies  Allergen Reactions   Amoxicillin Hives   Sulfa Antibiotics Hives   Penicillins Hives and Itching    Has patient had a PCN reaction causing immediate rash, facial/tongue/throat swelling, SOB or lightheadedness with hypotension: Yes Has patient had a PCN reaction causing severe rash involving mucus membranes or skin necrosis: No Has patient had a PCN reaction that required hospitalization No Has patient had a PCN reaction occurring within the last 10 years: Yes If  all of the above answers are "NO", then may proceed with Cephalosporin use.    Tape     PLASTIC TAPE   (WHELPS, TORE SKIN)   Tramadol Hives      The patient states she uses post menopausal status for birth control. Last LMP was Patient's last menstrual period was 11/05/2015.. Negative for Dysmenorrhea. Negative for: breast discharge, breast lump(s), breast pain and breast self exam. Associated symptoms include abnormal vaginal bleeding. Pertinent negatives include abnormal bleeding (hematology), anxiety, decreased libido, depression, difficulty falling sleep, dyspareunia, history of infertility, nocturia, sexual dysfunction, sleep disturbances, urinary incontinence, urinary urgency, vaginal discharge and vaginal itching. Diet regular.The patient states her exercise level is  intermittent.  . The patient's tobacco use is:  Social History   Tobacco Use  Smoking Status Never  Smokeless Tobacco Never  . She has been exposed to passive smoke. The patient's alcohol use is:  Social History   Substance and Sexual Activity  Alcohol Use Yes   Comment: ocass    Review of Systems  Constitutional: Negative.   HENT: Negative.    Eyes: Negative.   Respiratory: Negative.    Cardiovascular: Negative.   Gastrointestinal: Negative.   Endocrine: Negative.   Genitourinary: Negative.   Musculoskeletal: Negative.   Skin: Negative.   Allergic/Immunologic: Negative.   Neurological: Negative.   Hematological: Negative.   Psychiatric/Behavioral: Negative.       Today's Vitals   11/03/21 0952  BP: 124/80  Pulse: 86  Temp: 98.4 F (36.9 C)  Weight: 232 lb 6.4 oz (105.4 kg)  Height: 5' 8.5" (1.74 m)  PainSc: 3   PainLoc: Back   Body mass index is 34.82 kg/m.  Wt Readings from Last 3 Encounters:  11/03/21 232 lb 6.4 oz (105.4 kg)  09/08/21 236 lb 8 oz (107.3 kg)  09/01/21 235 lb 4.8 oz (106.7 kg)     Objective:  Physical Exam Vitals and nursing note reviewed.  Constitutional:       Appearance: Normal appearance.  HENT:     Head: Normocephalic and atraumatic.     Right Ear: Tympanic membrane, ear canal and external ear normal.     Left Ear: Tympanic membrane, ear canal and external ear normal.     Nose: Nose normal.     Mouth/Throat:     Mouth: Mucous membranes are moist.     Pharynx: Oropharynx is clear.  Eyes:     Extraocular Movements: Extraocular movements intact.     Conjunctiva/sclera: Conjunctivae normal.     Pupils: Pupils are equal, round, and  reactive to light.  Cardiovascular:     Rate and Rhythm: Normal rate and regular rhythm.     Pulses: Normal pulses.     Heart sounds: Normal heart sounds.  Pulmonary:     Effort: Pulmonary effort is normal.     Breath sounds: Normal breath sounds.  Chest:  Breasts:    Tanner Score is 5.     Right: Normal.     Left: Normal.  Abdominal:     General: Abdomen is flat. Bowel sounds are normal.     Palpations: Abdomen is soft.  Genitourinary:    Comments: deferred Musculoskeletal:        General: Normal range of motion.     Cervical back: Normal range of motion and neck supple.  Skin:    General: Skin is warm and dry.     Comments: Areas of erythema in intertriginous areas No vesicular lesions noted  Neurological:     General: No focal deficit present.     Mental Status: She is alert and oriented to person, place, and time.  Psychiatric:        Mood and Affect: Mood normal.        Behavior: Behavior normal.      Assessment And Plan:     1. Encounter for general adult medical examination w/o abnormal findings Comments: A full exam was performed. Importance of monthly self breast exams was discussed with the patient.  - CBC - Lipid panel - Hemoglobin A1c - Insulin, random(561) - ALT - TSH  2. Pure hypercholesterolemia Comments: July 2023 labs reviewed. She states she was not taking meds then, wants to recheck her chol today. Now on atorvastatin three days/wk dosing.  3. Prediabetes Comments: Her  a1c has been elevated in the past. I will recheck this today.   4. Intertrigo Comments: I will send rx as listed below.  She will let me know if her sx persist.  - nystatin powder; Apply 1 Application topically 3 (three) times daily.  Dispense: 15 g; Refill: 0  5. Class 1 obesity due to excess calories with serious comorbidity and body mass index (BMI) of 34.0 to 34.9 in adult Comments: She is encouraged to aim for at least 150 minutes of exercise/week, while striving for BIM<30 to decrease cardiac risk.   6. Immunization due - Flu Vaccine QUAD 6+ mos PF IM (Fluarix Quad PF)  Patient was given opportunity to ask questions. Patient verbalized understanding of the plan and was able to repeat key elements of the plan. All questions were answered to their satisfaction.   I, [name], MD, have reviewed all documentation for this visit. The documentation on 11/03/21 for the exam, diagnosis, procedures, and orders are all accurate and complete.   THE PATIENT IS ENCOURAGED TO PRACTICE SOCIAL DISTANCING DUE TO THE COVID-19 PANDEMIC.

## 2021-11-03 NOTE — Patient Instructions (Signed)

## 2021-11-04 LAB — HEMOGLOBIN A1C
Est. average glucose Bld gHb Est-mCnc: 123 mg/dL
Hgb A1c MFr Bld: 5.9 % — ABNORMAL HIGH (ref 4.8–5.6)

## 2021-11-04 LAB — CBC
Hematocrit: 45.7 % (ref 34.0–46.6)
Hemoglobin: 14.8 g/dL (ref 11.1–15.9)
MCH: 26.5 pg — ABNORMAL LOW (ref 26.6–33.0)
MCHC: 32.4 g/dL (ref 31.5–35.7)
MCV: 82 fL (ref 79–97)
Platelets: 221 10*3/uL (ref 150–450)
RBC: 5.58 x10E6/uL — ABNORMAL HIGH (ref 3.77–5.28)
RDW: 12.4 % (ref 11.7–15.4)
WBC: 3.5 10*3/uL (ref 3.4–10.8)

## 2021-11-04 LAB — ALT: ALT: 14 IU/L (ref 0–32)

## 2021-11-04 LAB — LIPID PANEL
Chol/HDL Ratio: 3.9 ratio (ref 0.0–4.4)
Cholesterol, Total: 232 mg/dL — ABNORMAL HIGH (ref 100–199)
HDL: 59 mg/dL (ref >39)
LDL Chol Calc (NIH): 162 mg/dL — ABNORMAL HIGH (ref 0–99)
Triglycerides: 64 mg/dL (ref 0–149)
VLDL Cholesterol Cal: 11 mg/dL (ref 5–40)

## 2021-11-04 LAB — TSH: TSH: 0.932 u[IU]/mL (ref 0.450–4.500)

## 2021-11-04 LAB — INSULIN, RANDOM: INSULIN: 9.2 u[IU]/mL (ref 2.6–24.9)

## 2021-11-29 DIAGNOSIS — N950 Postmenopausal bleeding: Secondary | ICD-10-CM | POA: Insufficient documentation

## 2021-12-13 ENCOUNTER — Emergency Department (HOSPITAL_COMMUNITY): Payer: BC Managed Care – PPO

## 2021-12-13 ENCOUNTER — Encounter (HOSPITAL_COMMUNITY): Payer: Self-pay

## 2021-12-13 ENCOUNTER — Emergency Department (HOSPITAL_COMMUNITY)
Admission: EM | Admit: 2021-12-13 | Discharge: 2021-12-13 | Disposition: A | Discharge status: Home / Self Care | Payer: BC Managed Care – PPO | Attending: Emergency Medicine | Admitting: Emergency Medicine

## 2021-12-13 ENCOUNTER — Other Ambulatory Visit: Payer: Self-pay

## 2021-12-13 DIAGNOSIS — R42 Dizziness and giddiness: Secondary | ICD-10-CM | POA: Diagnosis present

## 2021-12-13 DIAGNOSIS — R519 Headache, unspecified: Secondary | ICD-10-CM | POA: Insufficient documentation

## 2021-12-13 DIAGNOSIS — R200 Anesthesia of skin: Secondary | ICD-10-CM | POA: Insufficient documentation

## 2021-12-13 DIAGNOSIS — R110 Nausea: Secondary | ICD-10-CM | POA: Insufficient documentation

## 2021-12-13 DIAGNOSIS — R030 Elevated blood-pressure reading, without diagnosis of hypertension: Secondary | ICD-10-CM | POA: Insufficient documentation

## 2021-12-13 DIAGNOSIS — H53142 Visual discomfort, left eye: Secondary | ICD-10-CM | POA: Diagnosis not present

## 2021-12-13 DIAGNOSIS — R202 Paresthesia of skin: Secondary | ICD-10-CM | POA: Diagnosis not present

## 2021-12-13 LAB — BASIC METABOLIC PANEL
Anion gap: 8 (ref 5–15)
BUN: 10 mg/dL (ref 6–20)
CO2: 28 mmol/L (ref 22–32)
Calcium: 9.2 mg/dL (ref 8.9–10.3)
Chloride: 106 mmol/L (ref 98–111)
Creatinine, Ser: 0.61 mg/dL (ref 0.44–1.00)
GFR, Estimated: >60 mL/min (ref >60)
Glucose, Bld: 96 mg/dL (ref 70–99)
Potassium: 4 mmol/L (ref 3.5–5.1)
Sodium: 142 mmol/L (ref 135–145)

## 2021-12-13 LAB — CBC WITH DIFFERENTIAL/PLATELET
Abs Immature Granulocytes: 0 10*3/uL (ref 0.00–0.07)
Basophils Absolute: 0 10*3/uL (ref 0.0–0.1)
Basophils Relative: 1 %
Eosinophils Absolute: 0 10*3/uL (ref 0.0–0.5)
Eosinophils Relative: 1 %
HCT: 44.3 % (ref 36.0–46.0)
Hemoglobin: 14.1 g/dL (ref 12.0–15.0)
Immature Granulocytes: 0 %
Lymphocytes Relative: 43 %
Lymphs Abs: 1.4 10*3/uL (ref 0.7–4.0)
MCH: 26.9 pg (ref 26.0–34.0)
MCHC: 31.8 g/dL (ref 30.0–36.0)
MCV: 84.5 fL (ref 80.0–100.0)
Monocytes Absolute: 0.2 10*3/uL (ref 0.1–1.0)
Monocytes Relative: 6 %
Neutro Abs: 1.6 10*3/uL — ABNORMAL LOW (ref 1.7–7.7)
Neutrophils Relative %: 49 %
Platelets: 205 10*3/uL (ref 150–400)
RBC: 5.24 MIL/uL — ABNORMAL HIGH (ref 3.87–5.11)
RDW: 12.9 % (ref 11.5–15.5)
WBC: 3.2 10*3/uL — ABNORMAL LOW (ref 4.0–10.5)
nRBC: 0 % (ref 0.0–0.2)

## 2021-12-13 MED ORDER — LORAZEPAM 2 MG/ML IJ SOLN
1.0000 mg | Freq: Once | INTRAMUSCULAR | Status: AC
  Administered 2021-12-13: 1 mg via INTRAVENOUS
  Filled 2021-12-13: qty 1

## 2021-12-13 MED ORDER — MECLIZINE HCL 25 MG PO TABS
25.0000 mg | ORAL_TABLET | Freq: Once | ORAL | Status: AC
  Administered 2021-12-13: 25 mg via ORAL
  Filled 2021-12-13: qty 1

## 2021-12-13 MED ORDER — MECLIZINE HCL 25 MG PO TABS: 25.0000 mg | ORAL_TABLET | Freq: Three times a day (TID) | ORAL | 0 refills | Status: DC | PRN

## 2021-12-13 NOTE — ED Provider Triage Note (Signed)
Emergency Medicine Provider Triage Evaluation Note  [name] , a 51 y.o. female  was evaluated in triage.  Pt complains of sinus.  Was fine when she went to bed last night.  She woke up sat up out of bed and felt the room was spinning.  No visual field cuts.  Had some pain and tingling to her left leg which resolved.  No slurred speech.  No sudden onset headache.  Dizziness worse with movement.  Describes as the room was spinning.  No history of vertigo.  No facial droop, unilateral weakness.  Last known normal sometime yesterday evening. Felt like she was going to pass out when dizziness started  Review of Systems  Positive: Dizziness Negative:   Physical Exam  BP (!) 143/93 (BP Location: Left Arm)   Pulse 65   Temp 98 F (36.7 C) (Oral)   Resp 16   LMP 11/05/2015 Comment: tubal ligation  SpO2 97%  Gen:   Awake, no distress   Resp:  Normal effort  MSK:   Moves extremities without difficulty  Other:  CN 2-12 grossly intact, Equal strength, intact sensation, ambulatory without ataxic gait  Medical Decision Making  Medically screening exam initiated at 10:06 AM.  Appropriate orders placed.  [name] was informed that the remainder of the evaluation will be completed by another provider, this initial triage assessment does not replace that evaluation, and the importance of remaining in the ED until their evaluation is complete.  Dizziness   Not a code stroke, Neg LVO criteria   [name] A, [name] 12/13/21 1008

## 2021-12-13 NOTE — ED Notes (Signed)
Pt required about food. I talked with PA Christian. PA Christian allowed Pt to eat

## 2021-12-13 NOTE — ED Triage Notes (Signed)
Patient c/o dizziness, nausea, headache, and left leg numbness when she woke this AM.

## 2021-12-13 NOTE — Discharge Instructions (Addendum)
You are seen in the emergency department for evaluation of dizziness and left leg numbness.  You had lab work CAT scan and MRI of your brain that did not show any signs of stroke.  We are prescribing some meclizine which may help your symptoms.  Please drink plenty of fluids and rest.  Follow-up with your primary care doctor.  Return to the emergency department if any worsening or new symptoms.

## 2021-12-13 NOTE — ED Provider Notes (Signed)
[facility] DEPT Provider Note   CSN: [id number] Arrival date & time: 12/13/21  0930     History {Add pertinent medical, surgical, social history, OB history to HPI:1} Chief Complaint  Patient presents with   Dizziness   Nausea   Numbness   Headache    [name] is a 51 y.o. female.  He woke up today with feeling dizzy and spinning.  She said she could not get out of bed without assistance.  This continued throughout the day and she also noticed some numbness and tingling in her left leg.  She thought she might have some visual disturbance in her left eye.  She had some left-sided headache.  It was associated with nausea.  All of her symptoms have improved since arrival to the emergency department.  She denies any prior history of same.  No fevers or chills chest pain shortness of breath cough vomiting diarrhea or urinary symptoms.  She has tried nothing for symptoms.  The history is provided by the patient.  Dizziness Quality:  Room spinning and imbalance Severity:  Moderate Onset quality:  Sudden Duration:  1 day Timing:  Intermittent Progression:  Improving Chronicity:  New Context: head movement and standing up   Relieved by:  Nothing Worsened by:  Movement Ineffective treatments:  Lying down Associated symptoms: headaches, nausea and vision changes   Associated symptoms: no chest pain, no diarrhea, no hearing loss, no palpitations, no shortness of breath, no syncope, no vomiting and no weakness   Headache Pain location:  L temporal Quality:  Dull Timing:  Constant Progression:  Improving Chronicity:  New Relieved by:  None tried Ineffective treatments:  None tried Associated symptoms: blurred vision, dizziness, nausea, numbness, tingling and visual change   Associated symptoms: no abdominal pain, no cough, no diarrhea, no fever, no hearing loss, no neck pain, no photophobia, no syncope, no vomiting and no weakness         Home Medications Prior to Admission medications   Medication Sig Start Date End Date Taking? Authorizing Provider  albuterol (VENTOLIN HFA) 108 (90 Base) MCG/ACT inhaler 2 puffs q 4 hours as neededFor shortness of breath Ok to sub with proair (covered rx) Patient not taking: Reported on 11/03/2021 07/15/20   Bary Castilla, NP  atorvastatin (LIPITOR) 20 MG tablet One tab po M-F, skip weekends 07/10/21   Glendale Chard, MD  Cholecalciferol (VITAMIN D3) 250 MCG (10000 UT) TABS Take 1 capsule by mouth daily. Patient not taking: Reported on 11/03/2021    [provider]  ELDERBERRY PO Take by mouth. 1 per day Patient not taking: Reported on 11/03/2021    [provider]  famotidine (PEPCID) 20 MG tablet Take 20 mg by mouth as needed for heartburn or indigestion. Patient not taking: Reported on 11/03/2021    [provider]  NON FORMULARY 2 capsules daily. Optimal - M Bone, nerves, muscles Patient not taking: Reported on 11/03/2021    [provider]  NON FORMULARY 3 capsules daily. Rejuveniix Energy and Mental Alertness Patient not taking: Reported on 11/03/2021    [provider]  NON FORMULARY 2 capsules at bedtime. Omega-Q Heart and Brain Function Patient not taking: Reported on 11/03/2021    [provider]  NON FORMULARY 1 capsule. Vinali Heart, Eyes, Skin, Lungs Patient not taking: Reported on 11/03/2021    [provider]  NON FORMULARY 2 capsules. Porbiotiix Patient not taking: Reported on 11/03/2021    [provider]  nystatin  powder Apply 1 Application topically 3 (three) times daily. 11/03/21   Glendale Chard, MD  zinc gluconate 50 MG tablet Take 50 mg by mouth daily. Patient not taking: Reported on 11/03/2021    [provider]      Allergies    Amoxicillin, Sulfa antibiotics, Penicillins, Tape, and Tramadol    Review of Systems   Review of Systems  Constitutional:  Negative for fever.  HENT:   Negative for hearing loss.   Eyes:  Positive for blurred vision and visual disturbance. Negative for photophobia.  Respiratory:  Negative for cough and shortness of breath.   Cardiovascular:  Negative for chest pain, palpitations and syncope.  Gastrointestinal:  Positive for nausea. Negative for abdominal pain, diarrhea and vomiting.  Musculoskeletal:  Negative for neck pain.  Neurological:  Positive for dizziness, numbness and headaches. Negative for syncope, speech difficulty and weakness.    Physical Exam Updated Vital Signs BP (!) 159/111   Pulse 70   Temp 97.8 F (36.6 C) (Oral)   Resp 13   Ht '5\' 9"'$  (1.753 m)   Wt 106.6 kg   LMP 11/05/2015 Comment: tubal ligation  SpO2 98%   BMI 34.70 kg/m  Physical Exam Vitals and nursing note reviewed.  Constitutional:      General: She is not in acute distress.    Appearance: Normal appearance. She is well-developed.  HENT:     Head: Normocephalic and atraumatic.     Right Ear: Tympanic membrane and ear canal normal.     Left Ear: Tympanic membrane and ear canal normal.     Mouth/Throat:     Mouth: Mucous membranes are moist.     Pharynx: Oropharynx is clear.  Eyes:     Conjunctiva/sclera: Conjunctivae normal.  Cardiovascular:     Rate and Rhythm: Normal rate and regular rhythm.     Heart sounds: No murmur heard. Pulmonary:     Effort: Pulmonary effort is normal. No respiratory distress.     Breath sounds: Normal breath sounds.  Abdominal:     Palpations: Abdomen is soft.     Tenderness: There is no abdominal tenderness. There is no guarding or rebound.  Musculoskeletal:        General: No swelling.     Cervical back: Neck supple.  Skin:    General: Skin is warm and dry.     Capillary Refill: Capillary refill takes less than 2 seconds.  Neurological:     General: No focal deficit present.     Mental Status: She is alert.     GCS: GCS eye subscore is 4. GCS verbal subscore is 5. GCS motor subscore is 6.     Cranial  Nerves: No cranial nerve deficit, dysarthria or facial asymmetry.     Sensory: No sensory deficit.     Motor: No weakness.     Gait: Gait normal.     ED Results / Procedures / Treatments   Labs (all labs ordered are listed, but only abnormal results are displayed) Labs Reviewed  CBC WITH DIFFERENTIAL/PLATELET - Abnormal; Notable for the following components:      Result Value   WBC 3.2 (*)    RBC 5.24 (*)    Neutro Abs 1.6 (*)    All other components within normal limits  BASIC METABOLIC PANEL    EKG EKG Interpretation  Date/Time:  Tuesday December 13 2021 10:20:06 EDT Ventricular Rate:  63 PR Interval:  154 QRS Duration: 90 QT Interval:  401 QTC Calculation:  411 R Axis:   15 Text Interpretation: Sinus rhythm Abnormal R-wave progression, early transition new abnormal r wave progression from prior 4/13 Confirmed by Aletta Edouard [phone]) on 12/13/2021 6:23:19 PM  Radiology CT HEAD WO CONTRAST (5MM)  Result Date: 12/13/2021 CLINICAL DATA:  Nausea, headache and dizziness. Left leg numbness is morning EXAM: CT HEAD WITHOUT CONTRAST TECHNIQUE: Contiguous axial images were obtained from the base of the skull through the vertex without intravenous contrast. RADIATION DOSE REDUCTION: This exam was performed according to the departmental dose-optimization program which includes automated exposure control, adjustment of the mA and/or kV according to patient size and/or use of iterative reconstruction technique. COMPARISON:  12/25/2018. FINDINGS: Brain: No evidence of acute infarction, hemorrhage, hydrocephalus, extra-axial collection or mass lesion/mass effect. Vascular: No hyperdense vessel or unexpected calcification. Skull: Normal. Negative for fracture or focal lesion. Sinuses/Orbits: Globes and orbits are unremarkable. Visualized sinuses are clear. Other: None. IMPRESSION: Normal unenhanced CT scan of the brain. Electronically Signed   By: Lajean Manes M.D.   On: 12/13/2021 10:39     Procedures Procedures  {Document cardiac monitor, telemetry assessment procedure when appropriate:1}  Medications Ordered in ED Medications  meclizine (ANTIVERT) tablet 25 mg (has no administration in time range)    ED Course/ Medical Decision Making/ A&P                           Medical Decision Making Amount and/or Complexity of Data Reviewed Radiology: ordered.   This patient complains of ***; this involves an extensive number of treatment Options and is a complaint that carries with it a high risk of complications and morbidity. The differential includes ***  I ordered, reviewed and interpreted labs, which included *** I ordered medication *** and reviewed PMP when indicated. I ordered imaging studies which included *** and I independently    visualized and interpreted imaging which showed *** Additional history obtained from *** Previous records obtained and reviewed *** I consulted *** and discussed lab and imaging findings and discussed disposition.  Cardiac monitoring reviewed, *** Social determinants considered, *** Critical Interventions: ***  After the interventions stated above, I reevaluated the patient and found *** Admission and further testing considered, ***   {Document critical care time when appropriate:1} {Document review of labs and clinical decision tools ie heart score, Chads2Vasc2 etc:1}  {Document your independent review of radiology images, and any outside records:1} {Document your discussion with family members, caretakers, and with consultants:1} {Document social determinants of health affecting pt's care:1} {Document your decision making why or why not admission, treatments were needed:1} Final Clinical Impression(s) / ED Diagnoses Final diagnoses:  None    Rx / DC Orders ED Discharge Orders     None

## 2021-12-16 ENCOUNTER — Telehealth (HOSPITAL_BASED_OUTPATIENT_CLINIC_OR_DEPARTMENT_OTHER): Payer: Self-pay

## 2021-12-16 ENCOUNTER — Telehealth: Payer: Self-pay | Admitting: *Deleted

## 2021-12-16 ENCOUNTER — Encounter: Payer: Self-pay | Admitting: Internal Medicine

## 2021-12-16 NOTE — Telephone Encounter (Signed)
Tele visit pre op appt 01/09/22 @ 2 pm. Med rec and consent are done.Marland Kitchen

## 2021-12-16 NOTE — Telephone Encounter (Signed)
   Name: [name]  DOB: Apr 28, 1970  MRN: [id number]  Primary Cardiologist: None  Chart reviewed as part of pre-operative protocol coverage. Because of [name]'s past medical history and time since last visit, she will require a follow-up phone visit in order to better assess preoperative cardiovascular risk.  Will route to preop callback team for assistance in scheduling appointment.   Loel Dubonnet, NP  12/16/2021, 3:51 PM

## 2021-12-16 NOTE — Telephone Encounter (Signed)
Tele visit pre op appt 01/09/22 @ 2 pm. Med rec and consent are done..     Patient Consent for Virtual Visit        KAELEIGH WESTENDORF has provided verbal consent on 12/16/2021 for a virtual visit (video or telephone).   CONSENT FOR VIRTUAL VISIT FOR:  Keyna B Pitsenbarger  By participating in this virtual visit I agree to the following:  I hereby voluntarily request, consent and authorize [facility] and its employed or contracted physicians, physician assistants, nurse practitioners or other licensed health care professionals (the Practitioner), to provide me with telemedicine health care services (the "Services") as deemed necessary by the treating Practitioner. I acknowledge and consent to receive the Services by the Practitioner via telemedicine. I understand that the telemedicine visit will involve communicating with the Practitioner through live audiovisual communication technology and the disclosure of certain medical information by electronic transmission. I acknowledge that I have been given the opportunity to request an in-person assessment or other available alternative prior to the telemedicine visit and am voluntarily participating in the telemedicine visit.  I understand that I have the right to withhold or withdraw my consent to the use of telemedicine in the course of my care at any time, without affecting my right to future care or treatment, and that the Practitioner or I may terminate the telemedicine visit at any time. I understand that I have the right to inspect all information obtained and/or recorded in the course of the telemedicine visit and may receive copies of available information for a reasonable fee.  I understand that some of the potential risks of receiving the Services via telemedicine include:  Delay or interruption in medical evaluation due to technological equipment failure or disruption; Information transmitted may not be sufficient (e.g. poor  resolution of images) to allow for appropriate medical decision making by the Practitioner; and/or  In rare instances, security protocols could fail, causing a breach of personal health information.  Furthermore, I acknowledge that it is my responsibility to provide information about my medical history, conditions and care that is complete and accurate to the best of my ability. I acknowledge that Practitioner's advice, recommendations, and/or decision may be based on factors not within their control, such as incomplete or inaccurate data provided by me or distortions of diagnostic images or specimens that may result from electronic transmissions. I understand that the practice of medicine is not an exact science and that Practitioner makes no warranties or guarantees regarding treatment outcomes. I acknowledge that a copy of this consent can be made available to me via my patient portal ([facility]), or I can request a printed copy by calling the office of [facility].    I understand that my insurance will be billed for this visit.   I have read or had this consent read to me. I understand the contents of this consent, which adequately explains the benefits and risks of the Services being provided via telemedicine.  I have been provided ample opportunity to ask questions regarding this consent and the Services and have had my questions answered to my satisfaction. I give my informed consent for the services to be provided through the use of telemedicine in my medical care

## 2021-12-16 NOTE — Telephone Encounter (Signed)
   Pre-operative Risk Assessment    Patient Name: [name]  DOB: 01/07/1971 MRN: [id number]      Request for Surgical Clearance    Procedure:   Colonoscopy  Date of Surgery:  Clearance 01/18/22                                 Surgeon:  Dr. Collene Mares Surgeon's Group or Practice Name:  [facility] Phone number:  [phone] Fax number:  [phone]   Type of Clearance Requested:   - Medical    Type of Anesthesia:   Propofol   Additional requests/questions:   Please evaluate the patient's history and advise [place] of any special consideration that should be made.  Barbaraann Faster   12/16/2021, 3:46 PM

## 2021-12-19 ENCOUNTER — Encounter: Payer: Self-pay | Admitting: Internal Medicine

## 2021-12-19 ENCOUNTER — Telehealth: Payer: Self-pay

## 2021-12-19 ENCOUNTER — Ambulatory Visit: Payer: BC Managed Care – PPO | Admitting: Internal Medicine

## 2021-12-19 VITALS — BP 130/72 | HR 70 | Temp 98.4°F | Ht 69.0 in | Wt 232.2 lb

## 2021-12-19 DIAGNOSIS — R519 Headache, unspecified: Secondary | ICD-10-CM

## 2021-12-19 DIAGNOSIS — R233 Spontaneous ecchymoses: Secondary | ICD-10-CM

## 2021-12-19 DIAGNOSIS — Z2821 Immunization not carried out because of patient refusal: Secondary | ICD-10-CM | POA: Diagnosis not present

## 2021-12-19 DIAGNOSIS — R42 Dizziness and giddiness: Secondary | ICD-10-CM

## 2021-12-19 DIAGNOSIS — Z09 Encounter for follow-up examination after completed treatment for conditions other than malignant neoplasm: Secondary | ICD-10-CM

## 2021-12-19 MED ORDER — ATORVASTATIN CALCIUM 20 MG PO TABS: ORAL_TABLET | ORAL | 2 refills | Status: DC

## 2021-12-19 MED ORDER — BUTALBITAL-APAP-CAFFEINE 50-325-40 MG PO TABS: ORAL_TABLET | ORAL | 0 refills | Status: DC

## 2021-12-19 NOTE — Telephone Encounter (Signed)
Transition Care Management Follow-up Telephone Call Date of discharge and from where: 12/13/2021 [facility]  How have you been since you were released from the hospital? Pt states she is doing okay. She does notice bruising on her legs.  Any questions or concerns? No  Items Reviewed: Did the pt receive and understand the discharge instructions provided? Yes  Medications obtained and verified? Yes  Other? Yes  Any new allergies since your discharge? No  Dietary orders reviewed? Yes Do you have support at home? Yes   Home Care and Equipment/Supplies: Were home health services ordered? no If so, what is the name of the agency? N/a  Has the agency set up a time to come to the patient's home? no Were any new equipment or medical supplies ordered?  No What is the name of the medical supply agency? N/a Were you able to get the supplies/equipment? no Do you have any questions related to the use of the equipment or supplies? No  Functional Questionnaire: (I = Independent and D = Dependent) ADLs: i  Bathing/Dressing- i  Meal Prep- i  Eating- i  Maintaining continence- i  Transferring/Ambulation- i  Managing Meds- i  Follow up appointments reviewed:  PCP Hospital f/u appt confirmed? No  Scheduled to see robyn sanders on n/a @ n/a. [facility] f/u appt confirmed? No  Scheduled to see n/a on n/a @ n/a. Are transportation arrangements needed? No  If their condition worsens, is the pt aware to call PCP or go to the Emergency Dept.? Yes Was the patient provided with contact information for the PCP's office or ED? Yes Was to pt encouraged to call back with questions or concerns? Yes

## 2021-12-19 NOTE — Telephone Encounter (Signed)
She can come in today at 15

## 2021-12-19 NOTE — Progress Notes (Unsigned)
[name],acting as a Education administrator for Tanya Greenland, MD.,have documented all relevant documentation on the behalf of Tanya Greenland, MD,as directed by  Tanya Greenland, MD while in the presence of Tanya Greenland, MD.    Subjective:     Patient ID: [name] , female    DOB: 05-15-1970 , 51 y.o.   MRN: [id number]   Chief Complaint  Patient presents with   Follow-up    ER    HPI  Patient presents today for a ER follow up.  She went to the ER 12/13/2021 for further evaluation of dizziness and LE paresthesias. She took an aspirin, then her husband helped her to the ER. She states all her tests were negative. She states her head still hurts on and off, but she is now having bruising  on her legs. She states she is no longer feeling dizzy anymore and she has stopped all the medication for the dizziness.   BP Readings from Last 3 Encounters: 12/19/21 : 130/72 12/13/21 : (!) 164/102 11/03/21 : 124/80       Past Medical History:  Diagnosis Date   Abdominal pain    Allergy    Allergy-induced asthma    Angina 05/2011   NO HEART PROBLEM WENT TO ED 05/22/11 FOR CP STRESS TEST WAS NEGATIVE    Ankle fracture    Aortic atherosclerosis (HCC) 09/01/2021   Chronic back pain greater than 3 months duration 01/17/2011   injured lumbar and posterior cervical neck in fall    Constipation    DDD (degenerative disc disease), cervical    Diverticulitis    Gallbladder problem    GERD (gastroesophageal reflux disease)    H/O hiatal hernia    Hyperlipidemia    IBS (irritable bowel syndrome)    Nausea    Neuromuscular disorder ([place])    LUMBA BACK  TROUBLE    Pre-diabetes    Rash    UNDER BIL BREASTS   Wrist fracture      Family History  Problem Relation Nicholson of Onset   Diabetes Mother    Hyperlipidemia Mother    Cancer Mother    Sleep apnea Mother    Obesity Mother    Heart attack Father    Cancer Father        kidney and prostate   Hyperlipidemia Father    Other Father         gout and clogged arteries   Hypertension Father    Kidney disease Father    Alcoholism Father    Renal Disease Father    Hyperlipidemia Sister    Diabetes Sister    Diabetes Brother    Hyperlipidemia Brother    Coronary artery disease Brother    Heart disease Maternal Grandmother      Current Outpatient Medications:    butalbital-acetaminophen-caffeine (FIORICET) 50-325-40 MG tablet, One tab po q8h prn headache, Disp: 20 tablet, Rfl: 0   Cholecalciferol (VITAMIN D3) 250 MCG (10000 UT) TABS, Take 1 capsule by mouth daily., Disp: , Rfl:    meclizine (ANTIVERT) 25 MG tablet, Take 1 tablet (25 mg total) by mouth 3 (three) times daily as needed for dizziness., Disp: 30 tablet, Rfl: 0   nystatin powder, Apply 1 Application topically 3 (three) times daily., Disp: 15 g, Rfl: 0   albuterol (VENTOLIN HFA) 108 (90 Base) MCG/ACT inhaler, 2 puffs q 4 hours as neededFor shortness of breath Ok to sub with proair (covered rx) (Patient not taking: Reported  on 11/03/2021), Disp: 18 g, Rfl: 1   atorvastatin (LIPITOR) 20 MG tablet, One tab po M-F, skip weekends, Disp: 90 tablet, Rfl: 2   ELDERBERRY PO, Take by mouth. 1 per day (Patient not taking: Reported on 11/03/2021), Disp: , Rfl:    famotidine (PEPCID) 20 MG tablet, Take 20 mg by mouth as needed for heartburn or indigestion. (Patient not taking: Reported on 11/03/2021), Disp: , Rfl:    NON FORMULARY, 2 capsules daily. Optimal - M Bone, nerves, muscles (Patient not taking: Reported on 11/03/2021), Disp: , Rfl:    NON FORMULARY, 3 capsules daily. Rejuveniix Energy and Mental Alertness (Patient not taking: Reported on 11/03/2021), Disp: , Rfl:    NON FORMULARY, 2 capsules at bedtime. Omega-Q Heart and Brain Function (Patient not taking: Reported on 11/03/2021), Disp: , Rfl:    NON FORMULARY, 1 capsule. Vinali Heart, Eyes, Skin, Lungs (Patient not taking: Reported on 11/03/2021), Disp: , Rfl:    NON FORMULARY, 2 capsules. Porbiotiix (Patient not taking:  Reported on 11/03/2021), Disp: , Rfl:    zinc gluconate 50 MG tablet, Take 50 mg by mouth daily. (Patient not taking: Reported on 11/03/2021), Disp: , Rfl:    Allergies  Allergen Reactions   Amoxicillin Hives   Sulfa Antibiotics Hives   Penicillins Hives and Itching    Has patient had a PCN reaction causing immediate rash, facial/tongue/throat swelling, SOB or lightheadedness with hypotension: Yes Has patient had a PCN reaction causing severe rash involving mucus membranes or skin necrosis: No Has patient had a PCN reaction that required hospitalization No Has patient had a PCN reaction occurring within the last 10 years: Yes If all of the above answers are "NO", then may proceed with Cephalosporin use.    Tape     PLASTIC TAPE   (WHELPS, TORE SKIN)   Tramadol Hives     Review of Systems  Constitutional: Negative.   HENT: Negative.    Eyes: Negative.   Respiratory: Negative.    Cardiovascular: Negative.   Gastrointestinal: Negative.   Neurological:  Positive for headaches.     Today's Vitals   12/19/21 1626  BP: 130/72  Pulse: 70  Temp: 98.4 F (36.9 C)  TempSrc: Oral  Weight: 232 lb 3.2 oz (105.3 kg)  Height: '5\' 9"'$  (1.753 m)  PainSc: 6   PainLoc: Head   Body mass index is 34.29 kg/m.  Wt Readings from Last 3 Encounters:  12/19/21 232 lb 3.2 oz (105.3 kg)  12/13/21 235 lb (106.6 kg)  11/03/21 232 lb 6.4 oz (105.4 kg)    Objective:  Physical Exam Vitals and nursing note reviewed.  Constitutional:      Appearance: Normal appearance. She is obese.  HENT:     Head: Normocephalic and atraumatic.     Nose:     Comments: Masked     Mouth/Throat:     Comments: Masked  Eyes:     Extraocular Movements: Extraocular movements intact.  Cardiovascular:     Rate and Rhythm: Normal rate and regular rhythm.     Heart sounds: Normal heart sounds.  Pulmonary:     Effort: Pulmonary effort is normal.     Breath sounds: Normal breath sounds.  Musculoskeletal:     Cervical  back: Normal range of motion.  Skin:    General: Skin is warm.  Neurological:     General: No focal deficit present.     Mental Status: She is alert.  Psychiatric:  Mood and Affect: Mood normal.        Behavior: Behavior normal.      Assessment And Plan:     1. Vertigo Comments: ER records reviewed. She is encouraged to let me know if her sx recur.  2. Spontaneous ecchymoses Comments: ER labs reviewed, no thrombocytopenia noted. She is advised to start vitamin D 250-'500mg'$  daily.  3. Unilateral headache Comments: Suggestive of migraine. I will send rx Fioricet to use prn. Possibly tension HA, advised to massage temples with topical pain cream.     4. Immunization declined  5. Hospital discharge follow-up   Patient was given opportunity to ask questions. Patient verbalized understanding of the plan and was able to repeat key elements of the plan. All questions were answered to their satisfaction.   I, Tanya Greenland, MD, have reviewed all documentation for this visit. The documentation on 12/19/21 for the exam, diagnosis, procedures, and orders are all accurate and complete.   IF YOU HAVE BEEN REFERRED TO A SPECIALIST, IT MAY TAKE 1-2 WEEKS TO SCHEDULE/PROCESS THE REFERRAL. IF YOU HAVE NOT HEARD FROM US/SPECIALIST IN TWO WEEKS, PLEASE GIVE [place] A CALL AT [phone].   THE PATIENT IS ENCOURAGED TO PRACTICE SOCIAL DISTANCING DUE TO THE COVID-19 PANDEMIC.

## 2021-12-24 ENCOUNTER — Encounter: Payer: Self-pay | Admitting: Internal Medicine

## 2021-12-24 ENCOUNTER — Other Ambulatory Visit: Payer: Self-pay | Admitting: Internal Medicine

## 2021-12-24 DIAGNOSIS — L304 Erythema intertrigo: Secondary | ICD-10-CM

## 2021-12-26 DIAGNOSIS — R42 Dizziness and giddiness: Secondary | ICD-10-CM | POA: Insufficient documentation

## 2021-12-26 DIAGNOSIS — Z2821 Immunization not carried out because of patient refusal: Secondary | ICD-10-CM | POA: Insufficient documentation

## 2021-12-26 DIAGNOSIS — R519 Headache, unspecified: Secondary | ICD-10-CM | POA: Insufficient documentation

## 2021-12-26 MED ORDER — NYSTATIN 100000 UNIT/GM EX POWD: 1.0000 | Freq: Three times a day (TID) | CUTANEOUS | 0 refills | Status: DC

## 2022-01-09 ENCOUNTER — Ambulatory Visit: Payer: BC Managed Care – PPO | Attending: Cardiology | Admitting: General Practice

## 2022-01-09 DIAGNOSIS — Z01810 Encounter for preprocedural cardiovascular examination: Secondary | ICD-10-CM | POA: Diagnosis not present

## 2022-01-09 NOTE — Progress Notes (Signed)
Virtual Visit via Telephone Note   Because of [name]'s co-morbid illnesses, she is at least at moderate risk for complications without adequate follow up.  This format is felt to be most appropriate for this patient at this time.  The patient did not have access to video technology/had technical difficulties with video requiring transitioning to audio format only (telephone).  All issues noted in this document were discussed and addressed.  No physical exam could be performed with this format.  Please refer to the patient's chart for her consent to telehealth for [facility].  Evaluation Performed:  Preoperative cardiovascular risk assessment _____________   Date:  01/09/2022   Patient ID:  [name], DOB 09/14/1970, MRN [id number] Patient Location:  Home Provider location:   Office  Primary Care Provider:  Glendale Chard, MD Primary Cardiologist:  Skeet Latch, MD  Chief Complaint / Patient Profile   51 y.o. y/o female with a h/o HTN, GERD who is pending colonoscopy and presents today for telephonic preoperative cardiovascular risk assessment.  Past Medical History    Past Medical History:  Diagnosis Date   Abdominal pain    Allergy    Allergy-induced asthma    Angina 05/2011   NO HEART PROBLEM WENT TO ED 05/22/11 FOR CP STRESS TEST WAS NEGATIVE    Ankle fracture    Aortic atherosclerosis ([place]) 09/01/2021   Chronic back pain greater than 3 months duration 01/17/2011   injured lumbar and posterior cervical neck in fall    Constipation    DDD (degenerative disc disease), cervical    Diverticulitis    Gallbladder problem    GERD (gastroesophageal reflux disease)    H/O hiatal hernia    Hyperlipidemia    IBS (irritable bowel syndrome)    Nausea    Neuromuscular disorder ([place])    LUMBA BACK  TROUBLE    Pre-diabetes    Rash    UNDER BIL BREASTS   Wrist fracture    Past Surgical History:  Procedure Laterality Date   BREAST CYST EXCISION  Left ~ 1983   CHOLECYSTECTOMY  08/2010   COLONOSCOPY  07/2010   Removal of 3 polyps   DILATATION & CURETTAGE/HYSTEROSCOPY WITH MYOSURE N/A 09/03/2015   Procedure: [place];  Surgeon: Servando Salina, MD;  Location: [facility];  Service: Gynecology;  Laterality: N/A;   FRACTURE SURGERY  06/12/11   patient denies   HERNIA REPAIR  9983; 04/8248   umbilical corrected during tubal ligation; VHR w/mesh   TUBAL LIGATION  12/1995   VENTRAL HERNIA REPAIR  06/12/2011   Procedure: LAPAROSCOPIC VENTRAL HERNIA;  Surgeon: Gwenyth Ober, MD;  Location: [facility];  Service: General;  Laterality: N/A;   WISDOM TOOTH EXTRACTION  1990's    Allergies  Allergies  Allergen Reactions   Amoxicillin Hives   Sulfa Antibiotics Hives   Penicillins Hives and Itching    Has patient had a PCN reaction causing immediate rash, facial/tongue/throat swelling, SOB or lightheadedness with hypotension: Yes Has patient had a PCN reaction causing severe rash involving mucus membranes or skin necrosis: No Has patient had a PCN reaction that required hospitalization No Has patient had a PCN reaction occurring within the last 10 years: Yes If all of the above answers are "NO", then may proceed with Cephalosporin use.    Tape     PLASTIC TAPE   (WHELPS, TORE SKIN)   Tramadol Hives    History of Present Illness  [name] is a 51 y.o. female who presents via Engineer, civil (consulting) for a telehealth visit today.  Pt was last seen in cardiology clinic on 09/01/2021 by Dr. Oval Linsey.  At that time [name] was doing well .  The patient is now pending procedure as outlined above. Since her last visit, she she remains stable from a cardiac standpoint.  Today she denies chest pain, shortness of breath, lower extremity edema, fatigue, palpitations, melena, hemoptysis, diaphoresis, weakness, orthopnea, and PND.    Home Medications    Prior to Admission medications    Medication Sig Start Date End Date Taking? Authorizing Provider  albuterol (VENTOLIN HFA) 108 (90 Base) MCG/ACT inhaler 2 puffs q 4 hours as neededFor shortness of breath Ok to sub with proair (covered rx) Patient not taking: Reported on 11/03/2021 07/15/20   Bary Castilla, NP  atorvastatin (LIPITOR) 20 MG tablet One tab po M-F, skip weekends 12/19/21   Glendale Chard, MD  butalbital-acetaminophen-caffeine [facility]) [phone] MG tablet One tab po q8h prn headache 12/19/21   Glendale Chard, MD  Cholecalciferol (VITAMIN D3) 250 MCG (10000 UT) TABS Take 1 capsule by mouth daily.    [provider]  ELDERBERRY PO Take by mouth. 1 per day Patient not taking: Reported on 11/03/2021    [provider]  famotidine (PEPCID) 20 MG tablet Take 20 mg by mouth as needed for heartburn or indigestion. Patient not taking: Reported on 11/03/2021    [provider]  meclizine (ANTIVERT) 25 MG tablet Take 1 tablet (25 mg total) by mouth 3 (three) times daily as needed for dizziness. 12/13/21   Hayden Rasmussen, MD  NON FORMULARY 2 capsules daily. Optimal - M Bone, nerves, muscles Patient not taking: Reported on 11/03/2021    [provider]  NON FORMULARY 3 capsules daily. Rejuveniix Energy and Mental Alertness Patient not taking: Reported on 11/03/2021    [provider]  NON FORMULARY 2 capsules at bedtime. Omega-Q Heart and Brain Function Patient not taking: Reported on 11/03/2021    [provider]  NON FORMULARY 1 capsule. Vinali Heart, Eyes, Skin, Lungs Patient not taking: Reported on 11/03/2021    [provider]  NON FORMULARY 2 capsules. Porbiotiix Patient not taking: Reported on 11/03/2021    [provider]  nystatin powder Apply 1 Application topically 3 (three) times daily. 12/26/21   Glendale Chard, MD  zinc gluconate 50 MG tablet Take 50 mg by mouth daily. Patient not taking: Reported on 11/03/2021    [provider]    Physical Exam    Vital Signs:  Devetta B Inge does not have vital signs available for review today.  Given telephonic nature of communication, physical exam is limited. AAOx3. NAD. Normal affect.  Speech and respirations are unlabored.  Accessory Clinical Findings    None  Assessment & Plan    1.  Preoperative Cardiovascular Risk Assessment: Colonoscopy, Dr. Collene Mares, [facility]   Primary Cardiologist: Skeet Latch, MD  Chart reviewed as part of pre-operative protocol coverage. Given past medical history and time since last visit, based on ACC/AHA guidelines, Dim B Hladik would be at acceptable risk for the planned procedure without further cardiovascular testing.   Patient was advised that if she develops new symptoms prior to surgery to contact our office to arrange a follow-up appointment.  She verbalized understanding.  I will route this recommendation to the requesting party via Epic fax function and remove from pre-op pool.  Time:   Today, I have spent  5 minutes with the patient with telehealth technology discussing medical history, symptoms, and management plan.  Prior to her phone evaluation I spent greater than 10 minutes reviewing her past medical history and cardiac medications.   Deberah Pelton, NP  01/09/2022, 8:21 AM

## 2022-01-18 LAB — HM COLONOSCOPY

## 2022-02-06 ENCOUNTER — Encounter: Payer: Self-pay | Admitting: Internal Medicine

## 2022-03-01 ENCOUNTER — Encounter (HOSPITAL_BASED_OUTPATIENT_CLINIC_OR_DEPARTMENT_OTHER): Payer: Self-pay | Admitting: Cardiovascular Disease

## 2022-03-01 ENCOUNTER — Ambulatory Visit (INDEPENDENT_AMBULATORY_CARE_PROVIDER_SITE_OTHER): Payer: BC Managed Care – PPO | Admitting: Internal Medicine

## 2022-03-01 ENCOUNTER — Encounter: Payer: Self-pay | Admitting: Internal Medicine

## 2022-03-01 VITALS — BP 120/72 | HR 75 | Temp 98.6°F | Ht 68.0 in | Wt 246.8 lb

## 2022-03-01 DIAGNOSIS — E7800 Pure hypercholesterolemia, unspecified: Secondary | ICD-10-CM

## 2022-03-01 DIAGNOSIS — G9331 Postviral fatigue syndrome: Secondary | ICD-10-CM | POA: Diagnosis not present

## 2022-03-01 DIAGNOSIS — E6609 Other obesity due to excess calories: Secondary | ICD-10-CM

## 2022-03-01 DIAGNOSIS — L304 Erythema intertrigo: Secondary | ICD-10-CM | POA: Diagnosis not present

## 2022-03-01 DIAGNOSIS — R21 Rash and other nonspecific skin eruption: Secondary | ICD-10-CM

## 2022-03-01 DIAGNOSIS — Z6837 Body mass index (BMI) 37.0-37.9, adult: Secondary | ICD-10-CM

## 2022-03-01 MED ORDER — NYSTATIN 100000 UNIT/GM EX POWD: 1.0000 | Freq: Three times a day (TID) | CUTANEOUS | 0 refills | Status: DC

## 2022-03-01 MED ORDER — HYDROXYZINE HCL 10 MG PO TABS: 10.0000 mg | ORAL_TABLET | Freq: Three times a day (TID) | ORAL | 0 refills | Status: DC | PRN

## 2022-03-01 MED ORDER — CYANOCOBALAMIN 1000 MCG/ML IJ SOLN
1000.0000 ug | Freq: Once | INTRAMUSCULAR | Status: AC
  Administered 2022-03-01: 1000 ug via INTRAMUSCULAR

## 2022-03-01 MED ORDER — TRIAMCINOLONE ACETONIDE 0.1 % EX CREA: TOPICAL_CREAM | CUTANEOUS | 0 refills | Status: DC

## 2022-03-01 NOTE — Progress Notes (Signed)
[name],acting as a Education administrator for Tanya Greenland, MD.,have documented all relevant documentation on the behalf of Tanya Greenland, MD,as directed by  Tanya Greenland, MD while in the presence of Tanya Greenland, MD.    Subjective:     Patient ID: [name] Age , female    DOB: [birth year] , 52 y.o.   MRN: [id number]   Chief Complaint  Patient presents with   Rash    HPI  Patient presents today for a rash. She reports the rash started about 2 weeks ago. She states red, itchy rash appeared on her left shoulder right after Christmas. She initially thought this was due to her daughter's dog. She adds that a second rash showed up on her right lateral thigh. She states it is super itchy. She has not tried any OTC creams for relief. Her husband does not have a similar rash.  Denies associated fever/chills. She denies seeing blisters. She denies using any new detergents, softeners, soaps or lotions.   Rash This is a new problem. The current episode started 1 to 4 weeks ago. The problem is unchanged. The affected locations include the left shoulder and right upper leg. The rash is characterized by itchiness. It is unknown if there was an exposure to a precipitant. Treatments tried: benadryl cream. The treatment provided mild relief.     Past Medical History:  Diagnosis Date   Abdominal pain    Allergy    Allergy-induced asthma    Angina 05/2011   NO HEART PROBLEM WENT TO ED 05/22/11 FOR CP STRESS TEST WAS NEGATIVE    Ankle fracture    Aortic atherosclerosis ([place]) 09/01/2021   Chronic back pain greater than 3 months duration 01/17/2011   injured lumbar and posterior cervical neck in fall    Constipation    DDD (degenerative disc disease), cervical    Diverticulitis    Gallbladder problem    GERD (gastroesophageal reflux disease)    H/O hiatal hernia    Hyperlipidemia    IBS (irritable bowel syndrome)    Nausea    Neuromuscular disorder ([place])    LUMBA BACK  TROUBLE     Pre-diabetes    Rash    UNDER BIL BREASTS   Wrist fracture      Family History  Problem Relation Age of Onset   Diabetes Mother    Hyperlipidemia Mother    Cancer Mother    Sleep apnea Mother    Obesity Mother    Heart attack Father    Cancer Father        kidney and prostate   Hyperlipidemia Father    Other Father        gout and clogged arteries   Hypertension Father    Kidney disease Father    Alcoholism Father    Renal Disease Father    Hyperlipidemia Sister    Diabetes Sister    Diabetes Brother    Hyperlipidemia Brother    Coronary artery disease Brother    Heart disease Maternal Grandmother      Current Outpatient Medications:    atorvastatin (LIPITOR) 20 MG tablet, One tab po M-F, skip weekends, Disp: 90 tablet, Rfl: 2   butalbital-acetaminophen-caffeine (FIORICET) 50-325-40 MG tablet, One tab po q8h prn headache, Disp: 20 tablet, Rfl: 0   Cholecalciferol (VITAMIN D3) 250 MCG (10000 UT) TABS, Take 1 capsule by mouth daily., Disp: , Rfl:    hydrOXYzine (ATARAX) 10 MG tablet, Take 1 tablet (10 mg  total) by mouth 3 (three) times daily as needed., Disp: 30 tablet, Rfl: 0   meclizine (ANTIVERT) 25 MG tablet, Take 1 tablet (25 mg total) by mouth 3 (three) times daily as needed for dizziness., Disp: 30 tablet, Rfl: 0   triamcinolone cream (KENALOG) 0.1 %, APPLY TO AFFECTED AREA TWICE DAILY AS NEEDED, Disp: 45 g, Rfl: 0   albuterol (VENTOLIN HFA) 108 (90 Base) MCG/ACT inhaler, 2 puffs q 4 hours as neededFor shortness of breath Ok to sub with proair (covered rx) (Patient not taking: Reported on 11/03/2021), Disp: 18 g, Rfl: 1   nystatin powder, Apply 1 Application topically 3 (three) times daily., Disp: 45 g, Rfl: 0  Current Facility-Administered Medications:    cyanocobalamin (VITAMIN B12) injection 1,000 mcg, 1,000 mcg, Intramuscular, Once, [place] Chard, MD   Allergies  Allergen Reactions   Amoxicillin Hives   Sulfa Antibiotics Hives   Penicillins Hives and Itching     Has patient had a PCN reaction causing immediate rash, facial/tongue/throat swelling, SOB or lightheadedness with hypotension: Yes Has patient had a PCN reaction causing severe rash involving mucus membranes or skin necrosis: No Has patient had a PCN reaction that required hospitalization No Has patient had a PCN reaction occurring within the last 10 years: Yes If all of the above answers are "NO", then may proceed with Cephalosporin use.    Tape     PLASTIC TAPE   (WHELPS, TORE SKIN)   Tramadol Hives     Review of Systems  Constitutional: Negative.   Eyes: Negative.   Respiratory: Negative.    Cardiovascular: Negative.   Gastrointestinal: Negative.   Musculoskeletal: Negative.   Skin:  Positive for rash.  Neurological: Negative.   Hematological: Negative.   Psychiatric/Behavioral: Negative.       Today's Vitals   03/01/22 0954  BP: 120/72  Pulse: 75  Temp: 98.6 F (37 C)  Weight: 246 lb 12.8 oz (111.9 kg)  Height: '5\' 8"'$  (1.727 m)  PainSc: 0-No pain   Body mass index is 37.53 kg/m.  Wt Readings from Last 3 Encounters:  03/01/22 246 lb 12.8 oz (111.9 kg)  12/19/21 232 lb 3.2 oz (105.3 kg)  12/13/21 235 lb (106.6 kg)     Objective:  Physical Exam Vitals and nursing note reviewed.  Constitutional:      Appearance: Normal appearance.  HENT:     Head: Normocephalic and atraumatic.     Nose:     Comments: Masked     Mouth/Throat:     Comments: Masked  Eyes:     Extraocular Movements: Extraocular movements intact.  Cardiovascular:     Rate and Rhythm: Normal rate and regular rhythm.     Heart sounds: Normal heart sounds.  Pulmonary:     Effort: Pulmonary effort is normal.     Breath sounds: Normal breath sounds.  Musculoskeletal:     Cervical back: Normal range of motion.  Skin:    General: Skin is warm.     Findings: Rash present.     Comments: Healed, erythematous macular rash on left shoulder and posterior neck.     Neurological:     General: No  focal deficit present.     Mental Status: She is alert.  Psychiatric:        Mood and Affect: Mood normal.        Behavior: Behavior normal.       Assessment And Plan:     1. Rash and nonspecific skin eruption Comments: Pic  on phone appears to be urticarial. No swelling today. I will send rx triamcinolone cream to affected area prn. I will send rx hydroxyzine qhs prn.  2. Postviral syndrome Comments: She is still having vertigo, does have h/o previous COVID infection. - cyanocobalamin (VITAMIN B12) injection 1,000 mcg  3. Intertrigo Comments: I will send nystatin powder refill as requested. - nystatin powder; Apply 1 Application topically 3 (three) times daily.  Dispense: 45 g; Refill: 0  4. Class 2 obesity due to excess calories without serious comorbidity with body mass index (BMI) of 37.0 to 37.9 in adult Comments: She is aware of 14 lb weight gain since her last visit.   Patient was given opportunity to ask questions. Patient verbalized understanding of the plan and was able to repeat key elements of the plan. All questions were answered to their satisfaction.   I, Tanya Greenland, MD, have reviewed all documentation for this visit. The documentation on 03/01/22 for the exam, diagnosis, procedures, and orders are all accurate and complete.   IF YOU HAVE BEEN REFERRED TO A SPECIALIST, IT MAY TAKE 1-2 WEEKS TO SCHEDULE/PROCESS THE REFERRAL. IF YOU HAVE NOT HEARD FROM US/SPECIALIST IN TWO WEEKS, PLEASE GIVE [place] A CALL AT [phone] X [phone].   THE PATIENT IS ENCOURAGED TO PRACTICE SOCIAL DISTANCING DUE TO THE COVID-19 PANDEMIC.

## 2022-03-10 ENCOUNTER — Encounter: Payer: Self-pay | Admitting: Internal Medicine

## 2022-03-13 ENCOUNTER — Telehealth (INDEPENDENT_AMBULATORY_CARE_PROVIDER_SITE_OTHER): Payer: BC Managed Care – PPO | Admitting: Internal Medicine

## 2022-03-13 ENCOUNTER — Encounter: Payer: Self-pay | Admitting: Internal Medicine

## 2022-03-13 DIAGNOSIS — I700 Atherosclerosis of aorta: Secondary | ICD-10-CM | POA: Diagnosis not present

## 2022-03-13 DIAGNOSIS — L304 Erythema intertrigo: Secondary | ICD-10-CM

## 2022-03-13 DIAGNOSIS — R21 Rash and other nonspecific skin eruption: Secondary | ICD-10-CM | POA: Diagnosis not present

## 2022-03-13 MED ORDER — FLUCONAZOLE 100 MG PO TABS: 100.0000 mg | ORAL_TABLET | Freq: Every day | ORAL | 0 refills | Status: AC

## 2022-03-13 MED ORDER — NYSTATIN 100000 UNIT/GM EX POWD: 1.0000 | Freq: Three times a day (TID) | CUTANEOUS | 1 refills | Status: DC

## 2022-03-13 MED ORDER — HYDROXYZINE HCL 25 MG PO TABS: ORAL_TABLET | ORAL | 0 refills | Status: DC

## 2022-03-13 NOTE — Progress Notes (Signed)
Virtual Visit via Video   This visit type was conducted due to national recommendations for restrictions regarding the COVID-19 Pandemic (e.g. social distancing) in an effort to limit this patient's exposure and mitigate transmission in our community.  Due to her co-morbid illnesses, this patient is at least at moderate risk for complications without adequate follow up.  This format is felt to be most appropriate for this patient at this time.  All issues noted in this document were discussed and addressed.  A limited physical exam was performed with this format.    This visit type was conducted due to national recommendations for restrictions regarding the COVID-19 Pandemic (e.g. social distancing) in an effort to limit this patient's exposure and mitigate transmission in our community.  Patients identity confirmed using two different identifiers.  This format is felt to be most appropriate for this patient at this time.  All issues noted in this document were discussed and addressed.  No physical exam was performed (except for noted visual exam findings with Video Visits).    Date:  03/19/2022   ID:  [name] Age, DOB 09/12/1970, MRN [id number]  Patient Location:  Home  Provider location:   Office    Chief Complaint:  "I am still breaking out"  History of Present Illness:    [name] is a 52 y.o. female who presents via video conferencing for a telehealth visit today.    The patient does not have symptoms concerning for COVID-19 infection (fever, chills, cough, or new shortness of breath).   She presents today for virtual visit. She prefers this method of contact due to COVID-19 pandemic.  She presents today for f/u rash. She states rash initially resolved, but came back. She is not sure why. She has red, itchy rash on her neck. Admits she wore a scarf, that she wore previously. It has not been washed yet. She adds that rash underneath her breasts have persisted as well.         Past Medical History:  Diagnosis Date   Abdominal pain    Allergy    Allergy-induced asthma    Angina 05/2011   NO HEART PROBLEM WENT TO ED 05/22/11 FOR CP STRESS TEST WAS NEGATIVE    Ankle fracture    Aortic atherosclerosis ([place]) 09/01/2021   Chronic back pain greater than 3 months duration 01/17/2011   injured lumbar and posterior cervical neck in fall    Constipation    DDD (degenerative disc disease), cervical    Diverticulitis    Gallbladder problem    GERD (gastroesophageal reflux disease)    H/O hiatal hernia    Hyperlipidemia    IBS (irritable bowel syndrome)    Nausea    Neuromuscular disorder ([place])    LUMBA BACK  TROUBLE    Pre-diabetes    Rash    UNDER BIL BREASTS   Wrist fracture    Past Surgical History:  Procedure Laterality Date   BREAST CYST EXCISION Left ~ 1983   CHOLECYSTECTOMY  08/2010   COLONOSCOPY  07/2010   Removal of 3 polyps   DILATATION & CURETTAGE/HYSTEROSCOPY WITH MYOSURE N/A 09/03/2015   Procedure: [place];  Surgeon: Servando Salina, MD;  Location: [facility];  Service: Gynecology;  Laterality: N/A;   FRACTURE SURGERY  06/12/11   patient denies   HERNIA REPAIR  4174; 0/8144   umbilical corrected during tubal ligation; VHR w/mesh   TUBAL LIGATION  12/1995   VENTRAL HERNIA REPAIR  06/12/2011   Procedure: LAPAROSCOPIC VENTRAL HERNIA;  Surgeon: Gwenyth Ober, MD;  Location: [facility];  Service: General;  Laterality: N/A;   WISDOM TOOTH EXTRACTION  1990's     Current Meds  Medication Sig   albuterol (VENTOLIN HFA) 108 (90 Base) MCG/ACT inhaler 2 puffs q 4 hours as neededFor shortness of breath Ok to sub with proair (covered rx)   butalbital-acetaminophen-caffeine (FIORICET) 50-325-40 MG tablet One tab po q8h prn headache   Cholecalciferol (VITAMIN D3) 250 MCG (10000 UT) TABS Take 1 capsule by mouth daily.   fluconazole (DIFLUCAN) 100 MG tablet Take 1 tablet (100 mg total) by mouth daily.   hydrOXYzine  (ATARAX) 25 MG tablet ONE TAB PO BID PRN   meclizine (ANTIVERT) 25 MG tablet Take 1 tablet (25 mg total) by mouth 3 (three) times daily as needed for dizziness.   triamcinolone cream (KENALOG) 0.1 % APPLY TO AFFECTED AREA TWICE DAILY AS NEEDED   [DISCONTINUED] atorvastatin (LIPITOR) 20 MG tablet One tab po M-F, skip weekends   [DISCONTINUED] hydrOXYzine (ATARAX) 10 MG tablet Take 1 tablet (10 mg total) by mouth 3 (three) times daily as needed.   [DISCONTINUED] nystatin powder Apply 1 Application topically 3 (three) times daily.     Allergies:   Amoxicillin, Sulfa antibiotics, Penicillins, Tape, and Tramadol   Social History   Tobacco Use   Smoking status: Never   Smokeless tobacco: Never  Vaping Use   Vaping Use: Never used  Substance Use Topics   Alcohol use: Yes    Comment: ocass   Drug use: No     Family Hx: The patient's family history includes Alcoholism in her father; Cancer in her father and mother; Coronary artery disease in her brother; Diabetes in her brother, mother, and sister; Heart attack in her father; Heart disease in her maternal grandmother; Hyperlipidemia in her brother, father, mother, and sister; Hypertension in her father; Kidney disease in her father; Obesity in her mother; Other in her father; Renal Disease in her father; Sleep apnea in her mother.  ROS:   Please see the history of present illness.    Review of Systems  Constitutional: Negative.   Respiratory: Negative.    Cardiovascular: Negative.   Gastrointestinal: Negative.   Neurological: Negative.   Psychiatric/Behavioral: Negative.      All other systems reviewed and are negative.   Labs/Other Tests and Data Reviewed:    Recent Labs: 11/03/2021: ALT 14; TSH 0.932 12/13/2021: BUN 10; Creatinine, Ser 0.61; Hemoglobin 14.1; Platelets 205; Potassium 4.0; Sodium 142   Recent Lipid Panel Lab Results  Component Value Date/Time   CHOL 232 (H) 11/03/2021 11:10 AM   TRIG 64 11/03/2021 11:10 AM    HDL 59 11/03/2021 11:10 AM   CHOLHDL 3.9 11/03/2021 11:10 AM   LDLCALC 162 (H) 11/03/2021 11:10 AM    Wt Readings from Last 3 Encounters:  03/01/22 246 lb 12.8 oz (111.9 kg)  12/19/21 232 lb 3.2 oz (105.3 kg)  12/13/21 235 lb (106.6 kg)     Exam:    Vital Signs:  LMP 11/05/2015 Comment: tubal ligation    Physical Exam Vitals and nursing note reviewed.  Constitutional:      Appearance: Normal appearance.  HENT:     Head: Normocephalic and atraumatic.  Eyes:     Extraocular Movements: Extraocular movements intact.  Pulmonary:     Effort: Pulmonary effort is normal.  Musculoskeletal:     Cervical back: Normal range of motion.  Skin:  Findings: Rash present.  Neurological:     Mental Status: She is alert and oriented to person, place, and time.  Psychiatric:        Mood and Affect: Affect normal.     ASSESSMENT & PLAN:    1. Rash and nonspecific skin eruption Comments: She is advised to wash scarf. She can apply topical Benadryl and/or HC cream to affected area BID prn. - hydrOXYzine (ATARAX) 25 MG tablet; ONE TAB PO BID PRN  Dispense: 30 tablet; Refill: 0  2. Intertrigo Comments: Persistent. I will send refill of nystatin powder. I will also send rx diflucan,'100mg'$  to take twice weekly. I will refer her to Derm if sx are persistent. - fluconazole (DIFLUCAN) 100 MG tablet; Take 1 tablet (100 mg total) by mouth daily.  Dispense: 10 tablet; Refill: 0 - nystatin powder; Apply 1 Application topically 3 (three) times daily.  Dispense: 45 g; Refill: 1  3. Aortic atherosclerosis ([facility]) Comments: Chronic, previously intolerant of atorvastatin. Seen on CT coronary in Spring 2023. She is followed by Cardiology, needs PharmD eval for statin alternatives.  COVID-19 Education: The signs and symptoms of COVID-19 were discussed with the patient and how to seek care for testing (follow up with PCP or arrange E-visit).  The importance of social distancing was discussed  today.  Patient Risk:   After full review of this patients clinical status, I feel that they are at least moderate risk at this time.  Time:   Today, I have spent 12 minutes/ seconds with the patient with telehealth technology discussing above diagnoses.     Medication Adjustments/Labs and Tests Ordered: Current medicines are reviewed at length with the patient today.  Concerns regarding medicines are outlined above.   Tests Ordered: No orders of the defined types were placed in this encounter.   Medication Changes: Meds ordered this encounter  Medications   fluconazole (DIFLUCAN) 100 MG tablet    Sig: Take 1 tablet (100 mg total) by mouth daily.    Dispense:  10 tablet    Refill:  0   hydrOXYzine (ATARAX) 25 MG tablet    Sig: ONE TAB PO BID PRN    Dispense:  30 tablet    Refill:  0   nystatin powder    Sig: Apply 1 Application topically 3 (three) times daily.    Dispense:  45 g    Refill:  1    Disposition:  Follow up prn  Signed, [name], MD

## 2022-03-19 DIAGNOSIS — L304 Erythema intertrigo: Secondary | ICD-10-CM | POA: Insufficient documentation

## 2022-03-19 DIAGNOSIS — R21 Rash and other nonspecific skin eruption: Secondary | ICD-10-CM | POA: Insufficient documentation

## 2022-03-24 ENCOUNTER — Encounter: Payer: Self-pay | Admitting: Internal Medicine

## 2022-03-29 ENCOUNTER — Ambulatory Visit: Payer: BC Managed Care – PPO

## 2022-03-29 NOTE — Progress Notes (Deleted)
Patient ID: [name]                 DOB: Apr 02, 1970                    MRN: [id number]     HPI: [name] is a 52 y.o. female patient referred to lipid clinic by ***. PMH is significant for   Current Medications:  Intolerances:  Risk Factors:  LDL goal:   Diet:   Exercise:   Family History:   Social History:   Labs:  Past Medical History:  Diagnosis Date   Abdominal pain    Allergy    Allergy-induced asthma    Angina 05/2011   NO HEART PROBLEM WENT TO ED 05/22/11 FOR CP STRESS TEST WAS NEGATIVE    Ankle fracture    Aortic atherosclerosis ([place]) 09/01/2021   Chronic back pain greater than 3 months duration 01/17/2011   injured lumbar and posterior cervical neck in fall    Constipation    DDD (degenerative disc disease), cervical    Diverticulitis    Gallbladder problem    GERD (gastroesophageal reflux disease)    H/O hiatal hernia    Hyperlipidemia    IBS (irritable bowel syndrome)    Nausea    Neuromuscular disorder ([facility])    LUMBA BACK  TROUBLE    Pre-diabetes    Rash    UNDER BIL BREASTS   Wrist fracture     Current Outpatient Medications on File Prior to Visit  Medication Sig Dispense Refill   albuterol (VENTOLIN HFA) 108 (90 Base) MCG/ACT inhaler 2 puffs q 4 hours as neededFor shortness of breath Ok to sub with proair (covered rx) 18 g 1   butalbital-acetaminophen-caffeine (FIORICET) 50-325-40 MG tablet One tab po q8h prn headache 20 tablet 0   Cholecalciferol (VITAMIN D3) 250 MCG (10000 UT) TABS Take 1 capsule by mouth daily.     fluconazole (DIFLUCAN) 100 MG tablet Take 1 tablet (100 mg total) by mouth daily. 10 tablet 0   hydrOXYzine (ATARAX) 25 MG tablet ONE TAB PO BID PRN 30 tablet 0   meclizine (ANTIVERT) 25 MG tablet Take 1 tablet (25 mg total) by mouth 3 (three) times daily as needed for dizziness. 30 tablet 0   nystatin powder Apply 1 Application topically 3 (three) times daily. 45 g 1   triamcinolone cream (KENALOG) 0.1 % APPLY TO  AFFECTED AREA TWICE DAILY AS NEEDED 45 g 0   No current facility-administered medications on file prior to visit.    Allergies  Allergen Reactions   Amoxicillin Hives   Sulfa Antibiotics Hives   Penicillins Hives and Itching    Has patient had a PCN reaction causing immediate rash, facial/tongue/throat swelling, SOB or lightheadedness with hypotension: Yes Has patient had a PCN reaction causing severe rash involving mucus membranes or skin necrosis: No Has patient had a PCN reaction that required hospitalization No Has patient had a PCN reaction occurring within the last 10 years: Yes If all of the above answers are "NO", then may proceed with Cephalosporin use.    Tape     PLASTIC TAPE   (WHELPS, TORE SKIN)   Tramadol Hives    Assessment/Plan:  1. Hyperlipidemia -

## 2022-03-31 ENCOUNTER — Encounter: Payer: Self-pay | Admitting: Internal Medicine

## 2022-03-31 ENCOUNTER — Telehealth (INDEPENDENT_AMBULATORY_CARE_PROVIDER_SITE_OTHER): Payer: BC Managed Care – PPO | Admitting: Internal Medicine

## 2022-03-31 VITALS — Ht 68.0 in

## 2022-03-31 DIAGNOSIS — L03311 Cellulitis of abdominal wall: Secondary | ICD-10-CM | POA: Diagnosis not present

## 2022-03-31 DIAGNOSIS — L304 Erythema intertrigo: Secondary | ICD-10-CM | POA: Diagnosis not present

## 2022-03-31 MED ORDER — DOXYCYCLINE HYCLATE 100 MG PO TABS: 100.0000 mg | ORAL_TABLET | Freq: Two times a day (BID) | ORAL | 0 refills | Status: DC

## 2022-03-31 NOTE — Progress Notes (Unsigned)
Virtual Visit via Video   This visit type was conducted due to national recommendations for restrictions regarding the COVID-19 Pandemic (e.g. social distancing) in an effort to limit this patient's exposure and mitigate transmission in our community.  Due to her co-morbid illnesses, this patient is at least at moderate risk for complications without adequate follow up.  This format is felt to be most appropriate for this patient at this time.  All issues noted in this document were discussed and addressed.  A limited physical exam was performed with this format.    This visit type was conducted due to national recommendations for restrictions regarding the COVID-19 Pandemic (e.g. social distancing) in an effort to limit this patient's exposure and mitigate transmission in our community.  Patients identity confirmed using two different identifiers.  This format is felt to be most appropriate for this patient at this time.  All issues noted in this document were discussed and addressed.  No physical exam was performed (except for noted visual exam findings with Video Visits).    Date:  04/01/2022   ID:  [name], DOB 1970/04/10, MRN [id number]  Patient Location:  In hair salon, earbud in place Pt is comfortable proceeding w/ exam in this environment  Provider location:   Office    Chief Complaint:  "I have a new rash"  History of Present Illness:    [name] is a 52 y.o. female who presents via video conferencing for a telehealth visit today.    The patient does not have symptoms concerning for COVID-19 infection (fever, chills, cough, or new shortness of breath).   She presents today for virtual visit. She prefers this method of contact due to COVID-19 pandemic.  She presents today for rash under her stomach. She first noticed the rash two days ago. She states when she noticed the wound was open, she scheduled this appt. She states it is now an 1 inch open wound.  She  has used an antibiotic cream at home. Patient also states sending a picture via mychart.   Rash This is a recurrent problem. The current episode started in the past 7 days. The problem has been gradually worsening since onset. The affected locations include the abdomen and chest. Pertinent negatives include no congestion, cough, fever or joint pain. Past treatments include antibiotic cream. The treatment provided mild relief.     Past Medical History:  Diagnosis Date   Abdominal pain    Allergy    Allergy-induced asthma    Angina 05/2011   NO HEART PROBLEM WENT TO ED 05/22/11 FOR CP STRESS TEST WAS NEGATIVE    Ankle fracture    Aortic atherosclerosis ([place]) 09/01/2021   Chronic back pain greater than 3 months duration 01/17/2011   injured lumbar and posterior cervical neck in fall    Constipation    DDD (degenerative disc disease), cervical    Diverticulitis    Gallbladder problem    GERD (gastroesophageal reflux disease)    H/O hiatal hernia    Hyperlipidemia    IBS (irritable bowel syndrome)    Nausea    Neuromuscular disorder ([place])    LUMBA BACK  TROUBLE    Pre-diabetes    Rash    UNDER BIL BREASTS   Wrist fracture    Past Surgical History:  Procedure Laterality Date   BREAST CYST EXCISION Left ~ 1983   CHOLECYSTECTOMY  08/2010   COLONOSCOPY  07/2010   Removal of 3 polyps  DILATATION & CURETTAGE/HYSTEROSCOPY WITH MYOSURE N/A 09/03/2015   Procedure: DILATATION & CURETTAGE/HYSTEROSCOPY WITH MYOSURE;  Surgeon: Servando Salina, MD;  Location: [facility];  Service: Gynecology;  Laterality: N/A;   FRACTURE SURGERY  06/12/11   patient denies   HERNIA REPAIR  0000000; [id number]   umbilical corrected during tubal ligation; VHR w/mesh   TUBAL LIGATION  12/1995   VENTRAL HERNIA REPAIR  06/12/2011   Procedure: LAPAROSCOPIC VENTRAL HERNIA;  Surgeon: Gwenyth Ober, MD;  Location: [facility];  Service: General;  Laterality: N/A;   WISDOM TOOTH EXTRACTION  1990's     Current Meds  Medication Sig    albuterol (VENTOLIN HFA) 108 (90 Base) MCG/ACT inhaler 2 puffs q 4 hours as neededFor shortness of breath Ok to sub with proair (covered rx)   butalbital-acetaminophen-caffeine (FIORICET) 50-325-40 MG tablet One tab po q8h prn headache   Cholecalciferol (VITAMIN D3) 250 MCG (10000 UT) TABS Take 1 capsule by mouth daily.   doxycycline (VIBRA-TABS) 100 MG tablet Take 1 tablet (100 mg total) by mouth 2 (two) times daily.   fluconazole (DIFLUCAN) 100 MG tablet Take 1 tablet (100 mg total) by mouth daily.   hydrOXYzine (ATARAX) 25 MG tablet ONE TAB PO BID PRN   meclizine (ANTIVERT) 25 MG tablet Take 1 tablet (25 mg total) by mouth 3 (three) times daily as needed for dizziness.   nystatin powder Apply 1 Application topically 3 (three) times daily.   triamcinolone cream (KENALOG) 0.1 % APPLY TO AFFECTED AREA TWICE DAILY AS NEEDED     Allergies:   Amoxicillin, Sulfa antibiotics, Penicillins, Tape, and Tramadol   Social History   Tobacco Use   Smoking status: Never   Smokeless tobacco: Never  Vaping Use   Vaping Use: Never used  Substance Use Topics   Alcohol use: Yes    Comment: ocass   Drug use: No     Family Hx: The patient's family history includes Alcoholism in her father; Cancer in her father and mother; Coronary artery disease in her brother; Diabetes in her brother, mother, and sister; Heart attack in her father; Heart disease in her maternal grandmother; Hyperlipidemia in her brother, father, mother, and sister; Hypertension in her father; Kidney disease in her father; Obesity in her mother; Other in her father; Renal Disease in her father; Sleep apnea in her mother.  ROS:   Please see the history of present illness.    Review of Systems  Constitutional:  Negative for fever.  HENT: Negative.  Negative for congestion.   Eyes: Negative.   Respiratory: Negative.  Negative for cough.   Cardiovascular: Negative.   Musculoskeletal:  Negative for joint pain.  Skin:  Positive for  rash.  Neurological: Negative.   Endo/Heme/Allergies: Negative.   Psychiatric/Behavioral: Negative.      All other systems reviewed and are negative.   Labs/Other Tests and Data Reviewed:    Recent Labs: 11/03/2021: ALT 14; TSH 0.932 12/13/2021: BUN 10; Creatinine, Ser 0.61; Hemoglobin 14.1; Platelets 205; Potassium 4.0; Sodium 142   Recent Lipid Panel Lab Results  Component Value Date/Time   CHOL 232 (H) 11/03/2021 11:10 AM   TRIG 64 11/03/2021 11:10 AM   HDL 59 11/03/2021 11:10 AM   CHOLHDL 3.9 11/03/2021 11:10 AM   LDLCALC 162 (H) 11/03/2021 11:10 AM    Wt Readings from Last 3 Encounters:  03/01/22 246 lb 12.8 oz (111.9 kg)  12/19/21 232 lb 3.2 oz (105.3 kg)  12/13/21 235 lb (106.6 kg)     Exam:  Vital Signs:  Ht 5' 8"$  (1.727 m)   LMP 11/05/2015 Comment: tubal ligation  BMI 37.53 kg/m     Physical Exam Vitals and nursing note reviewed.  HENT:     Head: Normocephalic and atraumatic.  Eyes:     Extraocular Movements: Extraocular movements intact.  Pulmonary:     Effort: Pulmonary effort is normal.  Musculoskeletal:     Cervical back: Normal range of motion.  Skin:    Comments: Pt sent photos prior to appt. Above her right groin, there is open wound, no drainage. No erythema.   Neurological:     Mental Status: She is alert and oriented to person, place, and time.  Psychiatric:        Mood and Affect: Mood and affect normal.        Behavior: Behavior normal.     ASSESSMENT & PLAN:     1. Cellulitis of abdominal wall Comments: I will send rx doxycycline to take twice daily. She is encouraged to take full course of abx. Also advised to use OTC Bacitracin daily. - Ambulatory referral to Dermatology  2. Intertrigo Comments: Recurrent. I will refer her to Derm for further evaluation since we are having difficulty getting this to clear up completely. - Ambulatory referral to Dermatology    COVID-19 Education: The signs and symptoms of COVID-19 were  discussed with the patient and how to seek care for testing (follow up with PCP or arrange E-visit).  The importance of social distancing was discussed today.  Patient Risk:   After full review of this patients clinical status, I feel that they are at least moderate risk at this time.  Time:   Today, I have spent 10 minutes/ seconds with the patient with telehealth technology discussing above diagnoses.     Medication Adjustments/Labs and Tests Ordered: Current medicines are reviewed at length with the patient today.  Concerns regarding medicines are outlined above.   Tests Ordered: Orders Placed This Encounter  Procedures   Ambulatory referral to Dermatology    Medication Changes: Meds ordered this encounter  Medications   doxycycline (VIBRA-TABS) 100 MG tablet    Sig: Take 1 tablet (100 mg total) by mouth 2 (two) times daily.    Dispense:  20 tablet    Refill:  0    Disposition:  Follow up prn  Signed, [name], MD

## 2022-03-31 NOTE — Patient Instructions (Addendum)
bacitracin Non-stick gauze  Cellulitis, Adult  Cellulitis is a skin infection. The infected area is often warm, red, swollen, and sore. It occurs most often in the arms and lower legs. It is very important to get treated for this condition. What are the causes? This condition is caused by bacteria. The bacteria enter through a break in the skin, such as a cut, burn, insect bite, open sore, or crack. What increases the risk? This condition is more likely to occur in people who: Have a weak body defense system (immune system). Have open cuts, burns, bites, or scrapes on the skin. Are older than 54 years of age. Have a blood sugar problem (diabetes). Have a long-lasting (chronic) liver disease (cirrhosis) or kidney disease. Are very overweight (obese). Have a skin problem, such as: Itchy rash (eczema). Slow movement of blood in the veins (venous stasis). Fluid buildup below the skin (edema). Have been treated with high-energy rays (radiation). Use IV drugs. What are the signs or symptoms? Symptoms of this condition include: Skin that is: Red. Streaking. Spotting. Swollen. Sore or painful when you touch it. Warm. A fever. Chills. Blisters. How is this diagnosed? This condition is diagnosed based on: Medical history. Physical exam. Blood tests. Imaging tests. How is this treated? Treatment for this condition may include: Medicines to treat infections or allergies. Home care, such as: Rest. Placing cold or warm cloths (compresses) on the skin. Hospital care, if the condition is very bad. Follow these instructions at home: Medicines Take over-the-counter and prescription medicines only as told by your doctor. If you were prescribed an antibiotic medicine, take it as told by your doctor. Do not stop taking it even if you start to feel better. General instructions  Drink enough fluid to keep your pee (urine) pale yellow. Do not touch or rub the infected area. Raise  (elevate) the infected area above the level of your heart while you are sitting or lying down. Place cold or warm cloths on the area as told by your doctor. Keep all follow-up visits as told by your doctor. This is important. Contact a doctor if: You have a fever. You do not start to get better after 1-2 days of treatment. Your bone or joint under the infected area starts to hurt after the skin has healed. Your infection comes back. This can happen in the same area or another area. You have a swollen bump in the area. You have new symptoms. You feel ill and have muscle aches and pains. Get help right away if: Your symptoms get worse. You feel very sleepy. You throw up (vomit) or have watery poop (diarrhea) for a long time. You see red streaks coming from the area. Your red area gets larger. Your red area turns dark in color. These symptoms may represent a serious problem that is an emergency. Do not wait to see if the symptoms will go away. Get medical help right away. Call your local emergency services (911 in the [place] Do not drive yourself to the hospital. Summary Cellulitis is a skin infection. The area is often warm, red, swollen, and sore. This condition is treated with medicines, rest, and cold and warm cloths. Take all medicines only as told by your doctor. Tell your doctor if symptoms do not start to get better after 1-2 days of treatment. This information is not intended to replace advice given to you by your health care provider. Make sure you discuss any questions you have with your health care provider.  Document Revised: 11/17/2020 Document Reviewed: 11/18/2020 Elsevier Patient Education  [place].

## 2022-04-03 ENCOUNTER — Ambulatory Visit: Payer: BC Managed Care – PPO | Admitting: Internal Medicine

## 2022-04-07 ENCOUNTER — Encounter: Payer: Self-pay | Admitting: Internal Medicine

## 2022-04-21 ENCOUNTER — Ambulatory Visit: Payer: BC Managed Care – PPO | Admitting: Student

## 2022-04-21 NOTE — Progress Notes (Deleted)
Patient ID: [name]                 DOB: 19-Sep-1970                    MRN: [id number]      HPI: [name] is a 52 y.o. female patient referred to lipid clinic by Dr. Oval Nicholson. PMH is significant for HTN, CAD, asthma, prediabetes, GERD, obesity,   Reviewed options for lowering LDL cholesterol, including ezetimibe, PCSK-9 inhibitors, bempedoic acid and inclisiran.  Discussed mechanisms of action, dosing, side effects and potential decreases in LDL cholesterol.  Also reviewed cost information and potential options for patient assistance.   Current Medications: none  Intolerances: Lipitor 20 mg, Crestor 5 mg - myalgia  Risk Factors: prediabetes, CAD, HTN, Obesity  LDL goal: <70 mg/dl  LDLc:114 TC:185, TG:66, HDL 69 (08/2021) Last lab LDLc:162, TC:232, TG:64, HDL:59   Diet:   Exercise:   Family History:   Social History:   Labs: Lipid Panel     Component Value Date/Time   CHOL 232 (H) 11/03/2021 1110   TRIG 64 11/03/2021 1110   HDL 59 11/03/2021 1110   CHOLHDL 3.9 11/03/2021 1110   LDLCALC 162 (H) 11/03/2021 1110   LABVLDL 11 11/03/2021 1110    Past Medical History:  Diagnosis Date   Abdominal pain    Allergy    Allergy-induced asthma    Angina 05/2011   NO HEART PROBLEM WENT TO ED 05/22/11 FOR CP STRESS TEST WAS NEGATIVE    Ankle fracture    Aortic atherosclerosis ([place]) 09/01/2021   Chronic back pain greater than 3 months duration 01/17/2011   injured lumbar and posterior cervical neck in fall    Constipation    DDD (degenerative disc disease), cervical    Diverticulitis    Gallbladder problem    GERD (gastroesophageal reflux disease)    H/O hiatal hernia    Hyperlipidemia    IBS (irritable bowel syndrome)    Nausea    Neuromuscular disorder ([facility])    LUMBA BACK  TROUBLE    Pre-diabetes    Rash    UNDER BIL BREASTS   Wrist fracture     Current Outpatient Medications on File Prior to Visit  Medication Sig Dispense Refill   albuterol  (VENTOLIN HFA) 108 (90 Base) MCG/ACT inhaler 2 puffs q 4 hours as neededFor shortness of breath Ok to sub with proair (covered rx) 18 g 1   butalbital-acetaminophen-caffeine (FIORICET) 50-325-40 MG tablet One tab po q8h prn headache 20 tablet 0   Cholecalciferol (VITAMIN D3) 250 MCG (10000 UT) TABS Take 1 capsule by mouth daily.     doxycycline (VIBRA-TABS) 100 MG tablet Take 1 tablet (100 mg total) by mouth 2 (two) times daily. 20 tablet 0   hydrOXYzine (ATARAX) 25 MG tablet ONE TAB PO BID PRN 30 tablet 0   meclizine (ANTIVERT) 25 MG tablet Take 1 tablet (25 mg total) by mouth 3 (three) times daily as needed for dizziness. 30 tablet 0   nystatin powder Apply 1 Application topically 3 (three) times daily. 45 g 1   triamcinolone cream (KENALOG) 0.1 % APPLY TO AFFECTED AREA TWICE DAILY AS NEEDED 45 g 0   No current facility-administered medications on file prior to visit.    Allergies  Allergen Reactions   Amoxicillin Hives   Sulfa Antibiotics Hives   Penicillins Hives and Itching    Has patient had a PCN reaction causing immediate rash, facial/tongue/throat  swelling, SOB or lightheadedness with hypotension: Yes Has patient had a PCN reaction causing severe rash involving mucus membranes or skin necrosis: No Has patient had a PCN reaction that required hospitalization No Has patient had a PCN reaction occurring within the last 10 years: Yes If all of the above answers are "NO", then may proceed with Cephalosporin use.    Tape     PLASTIC TAPE   (WHELPS, TORE SKIN)   Tramadol Hives    Assessment/Plan:  1. Hyperlipidemia -  No problems updated. No problem-specific Assessment & Plan notes found for this encounter.    Thank you,  [name], Pharm.D [facility] of [facility] [address], [place], [place] [address]  Phone: [phone]; Fax: [phone]

## 2022-05-07 ENCOUNTER — Ambulatory Visit
Admission: EM | Admit: 2022-05-07 | Discharge: 2022-05-07 | Disposition: A | Discharge status: Home / Self Care | Payer: BC Managed Care – PPO | Attending: Urgent Care | Admitting: Urgent Care

## 2022-05-07 DIAGNOSIS — J4530 Mild persistent asthma, uncomplicated: Secondary | ICD-10-CM | POA: Diagnosis not present

## 2022-05-07 DIAGNOSIS — B3789 Other sites of candidiasis: Secondary | ICD-10-CM | POA: Diagnosis not present

## 2022-05-07 DIAGNOSIS — J069 Acute upper respiratory infection, unspecified: Secondary | ICD-10-CM | POA: Diagnosis not present

## 2022-05-07 DIAGNOSIS — Z1152 Encounter for screening for COVID-19: Secondary | ICD-10-CM | POA: Diagnosis not present

## 2022-05-07 MED ORDER — CETIRIZINE HCL 10 MG PO TABS: 10.0000 mg | ORAL_TABLET | Freq: Every day | ORAL | 0 refills | Status: AC

## 2022-05-07 MED ORDER — ALBUTEROL SULFATE HFA 108 (90 BASE) MCG/ACT IN AERS: 1.0000 | INHALATION_SPRAY | Freq: Four times a day (QID) | RESPIRATORY_TRACT | 0 refills | Status: DC | PRN

## 2022-05-07 MED ORDER — KETOCONAZOLE 2 % EX CREA: 1.0000 | TOPICAL_CREAM | Freq: Every day | CUTANEOUS | 0 refills | Status: AC

## 2022-05-07 MED ORDER — PROMETHAZINE-DM 6.25-15 MG/5ML PO SYRP: 5.0000 mL | ORAL_SOLUTION | Freq: Three times a day (TID) | ORAL | 0 refills | Status: DC | PRN

## 2022-05-07 MED ORDER — PSEUDOEPHEDRINE HCL 60 MG PO TABS: 60.0000 mg | ORAL_TABLET | Freq: Three times a day (TID) | ORAL | 0 refills | Status: DC | PRN

## 2022-05-07 MED ORDER — FLUCONAZOLE 150 MG PO TABS: 150.0000 mg | ORAL_TABLET | ORAL | 0 refills | Status: DC

## 2022-05-07 NOTE — Discharge Instructions (Addendum)
We will notify you of your test results as they arrive and may take between about 24 hours.  I encourage you to sign up for MyChart if you have not already done so as this can be the easiest way for [place] to communicate results to you online or through a phone app.  Generally, we only contact you if it is a positive test result.  In the meantime, if you develop worsening symptoms including fever, chest pain, shortness of breath despite our current treatment plan then please report to the emergency room as this may be a sign of worsening status from possible viral infection.  Otherwise, we will manage this as a viral syndrome. For sore throat or cough try using a honey-based tea. Use 3 teaspoons of honey with juice squeezed from half lemon. Place shaved pieces of ginger into 1/2-1 cup of water and warm over stove top. Then mix the ingredients and repeat every 4 hours as needed. Please take Tylenol 500mg -650mg  every 6 hours for aches and pains, fevers. Hydrate very well with at least 2 liters of water. Eat light meals such as soups to replenish electrolytes and soft fruits, veggies. Start an antihistamine like Zyrtec (10mg  daily) for postnasal drainage, sinus congestion.  You can take this together with pseudoephedrine (Sudafed) at a dose of 60 mg 2-3 times a day as needed for the same kind of congestion.  Use the cough medications as needed. Use albuterol once every 6 hours as needed.

## 2022-05-07 NOTE — ED Notes (Signed)
In with [place], [place] for exam

## 2022-05-07 NOTE — ED Triage Notes (Signed)
Patient here today due to a productive cough, wheeze, SOB X 4 days. She has taken Nyquil cold and flu and Claritin which helped some. 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 She did visit someone that had Pneumonia on Thursday but started feeling ill before visiting them. No recent travel.   She is also here for a rash under her breast X 2-3 months. She has been her PCP and has tried a Nystatin powder and a Triamcinolone cream but not helping. She states that the rash is worsening. She describes it as raw and occasional itching.

## 2022-05-07 NOTE — ED Provider Notes (Addendum)
[facility]  Note:  This document was prepared using Systems analyst and may include unintentional dictation errors.  MRN: [id number] DOB: 1970/09/08  Subjective:   [name] is a 52 y.o. female presenting for 2 chief complaints.    Reports 4-day history of acute onset productive cough, wheezing and shortness of breath.  She is also had a lot of sinus pressure, bilateral ear fullness, sinus drainage, scratchy throat.  She has had multiple sick contacts.  She is not opposed to COVID testing.  No overt fever, body pains, chest pain.  She has a history of allergic asthma but has not needed an inhaler consistently.  Does not take her allergy medications daily either.  Only as needed. Reports 27-month history of persistent irritating rash under both of her breasts.  Has undergone treatment with nystatin powder and triamcinolone cream but is not helping at all.  Feels like the rashes worsening.  No drainage of pus or bleeding.  No current facility-administered medications for this encounter.  Current Outpatient Medications:    hydrOXYzine (ATARAX) 25 MG tablet, ONE TAB PO BID PRN, Disp: 30 tablet, Rfl: 0   meclizine (ANTIVERT) 25 MG tablet, Take 1 tablet (25 mg total) by mouth 3 (three) times daily as needed for dizziness., Disp: 30 tablet, Rfl: 0   nystatin powder, Apply 1 Application topically 3 (three) times daily., Disp: 45 g, Rfl: 1   triamcinolone cream (KENALOG) 0.1 %, APPLY TO AFFECTED AREA TWICE DAILY AS NEEDED, Disp: 45 g, Rfl: 0   albuterol (VENTOLIN HFA) 108 (90 Base) MCG/ACT inhaler, 2 puffs q 4 hours as neededFor shortness of breath Ok to sub with proair (covered rx), Disp: 18 g, Rfl: 1   butalbital-acetaminophen-caffeine (FIORICET) 50-325-40 MG tablet, One tab po q8h prn headache, Disp: 20 tablet, Rfl: 0   Cholecalciferol (VITAMIN D3) 250 MCG (10000 UT) TABS, Take 1 capsule by mouth daily., Disp: , Rfl:    doxycycline (VIBRA-TABS) 100  MG tablet, Take 1 tablet (100 mg total) by mouth 2 (two) times daily., Disp: 20 tablet, Rfl: 0   Allergies  Allergen Reactions   Amoxicillin Hives   Sulfa Antibiotics Hives   Penicillins Hives and Itching    Has patient had a PCN reaction causing immediate rash, facial/tongue/throat swelling, SOB or lightheadedness with hypotension: Yes Has patient had a PCN reaction causing severe rash involving mucus membranes or skin necrosis: No Has patient had a PCN reaction that required hospitalization No Has patient had a PCN reaction occurring within the last 10 years: Yes If all of the above answers are "NO", then may proceed with Cephalosporin use.    Tape     PLASTIC TAPE   (WHELPS, TORE SKIN)   Tramadol Hives    Past Medical History:  Diagnosis Date   Abdominal pain    Allergy    Allergy-induced asthma    Angina 05/2011   NO HEART PROBLEM WENT TO ED 05/22/11 FOR CP STRESS TEST WAS NEGATIVE    Ankle fracture    Aortic atherosclerosis ([place]) 09/01/2021   Chronic back pain greater than 3 months duration 01/17/2011   injured lumbar and posterior cervical neck in fall    Constipation    DDD (degenerative disc disease), cervical    Diverticulitis    Gallbladder problem    GERD (gastroesophageal reflux disease)    H/O hiatal hernia    Hyperlipidemia    IBS (irritable bowel syndrome)    Nausea  Neuromuscular disorder ([place])    LUMBA BACK  TROUBLE    Pre-diabetes    Rash    UNDER BIL BREASTS   Wrist fracture      Past Surgical History:  Procedure Laterality Date   BREAST CYST EXCISION Left ~ 1983   CHOLECYSTECTOMY  08/2010   COLONOSCOPY  07/2010   Removal of 3 polyps   DILATATION & CURETTAGE/HYSTEROSCOPY WITH MYOSURE N/A 09/03/2015   Procedure: Naomi;  Surgeon: Servando Salina, MD;  Location: [facility];  Service: Gynecology;  Laterality: N/A;   FRACTURE SURGERY  06/12/11   patient denies   HERNIA REPAIR  0000000; [id number]   umbilical  corrected during tubal ligation; VHR w/mesh   TUBAL LIGATION  12/1995   VENTRAL HERNIA REPAIR  06/12/2011   Procedure: LAPAROSCOPIC VENTRAL HERNIA;  Surgeon: Gwenyth Ober, MD;  Location: [facility];  Service: General;  Laterality: N/A;   WISDOM TOOTH EXTRACTION  13's    Family History  Problem Relation Age of Onset   Diabetes Mother    Hyperlipidemia Mother    Cancer Mother    Sleep apnea Mother    Obesity Mother    Heart attack Father    Cancer Father        kidney and prostate   Hyperlipidemia Father    Other Father        gout and clogged arteries   Hypertension Father    Kidney disease Father    Alcoholism Father    Renal Disease Father    Hyperlipidemia Sister    Diabetes Sister    Diabetes Brother    Hyperlipidemia Brother    Coronary artery disease Brother    Heart disease Maternal Grandmother     Social History   Tobacco Use   Smoking status: Never   Smokeless tobacco: Never  Vaping Use   Vaping Use: Never used  Substance Use Topics   Alcohol use: Yes    Comment: ocass   Drug use: No    ROS   Objective:   Vitals: BP 138/83 (BP Location: Right Arm)   Pulse 95   Temp 99.9 F (37.7 C) (Oral)   Resp 20   Ht 5\' 9"  (1.753 m)   Wt 242 lb (109.8 kg)   LMP 11/05/2015 Comment: tubal ligation  SpO2 93%   BMI 35.74 kg/m   Physical Exam Exam conducted with a chaperone present Investment banker, [employer] [place]).  Constitutional:      General: She is not in acute distress.    Appearance: Normal appearance. She is well-developed and normal weight. She is not ill-appearing, toxic-appearing or diaphoretic.  HENT:     Head: Normocephalic and atraumatic.     Right Ear: Tympanic membrane, ear canal and external ear normal. No drainage or tenderness. No middle ear effusion. There is no impacted cerumen. Tympanic membrane is not erythematous or bulging.     Left Ear: Tympanic membrane, ear canal and external ear normal. No drainage or tenderness.  No middle ear effusion. There is no  impacted cerumen. Tympanic membrane is not erythematous or bulging.     Nose: Nose normal. No congestion or rhinorrhea.     Mouth/Throat:     Mouth: Mucous membranes are moist. No oral lesions.     Pharynx: No pharyngeal swelling, oropharyngeal exudate, posterior oropharyngeal erythema or uvula swelling.     Tonsils: No tonsillar exudate or tonsillar abscesses.  Eyes:     General: No scleral icterus.  Right eye: No discharge.        Left eye: No discharge.     Extraocular Movements: Extraocular movements intact.     Right eye: Normal extraocular motion.     Left eye: Normal extraocular motion.     Conjunctiva/sclera: Conjunctivae normal.  Cardiovascular:     Rate and Rhythm: Normal rate and regular rhythm.     Heart sounds: Normal heart sounds. No murmur heard.    No friction rub. No gallop.  Pulmonary:     Effort: Pulmonary effort is normal. No respiratory distress.     Breath sounds: No stridor. No wheezing, rhonchi or rales.  Chest:     Chest wall: No tenderness.    Musculoskeletal:     Cervical back: Normal range of motion and neck supple.  Lymphadenopathy:     Cervical: No cervical adenopathy.  Skin:    General: Skin is warm and dry.  Neurological:     General: No focal deficit present.     Mental Status: She is alert and oriented to person, place, and time.  Psychiatric:        Mood and Affect: Mood normal.        Behavior: Behavior normal.     Assessment and Plan :   PDMP not reviewed this encounter.  1. Viral upper respiratory infection   2. Candidiasis of breast   3. 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  As we are managing that particular diagnosis, recommended against oral prednisone.  She does have clear lung sounds and therefore do not believe that she would benefit from prednisone and comes with the risk of worsening her breast candidiasis.  Patient  verbalized understanding and is in agreement.   Deferred imaging given clear cardiopulmonary exam, hemodynamically stable vital signs. Will manage for viral illness such as viral URI, viral syndrome, viral rhinitis, COVID-19. Recommended supportive care. Offered scripts for symptomatic relief. Testing is pending. Counseled patient on potential for adverse effects with medications prescribed/recommended today, ER and return-to-clinic precautions discussed, patient verbalized understanding.   She should undergo Paxlovid treatment should she test positive for COVID-19.      Jaynee Eagles, [name] 05/07/22 1133

## 2022-05-08 LAB — SARS CORONAVIRUS 2 (TAT 6-24 HRS): SARS Coronavirus 2: NEGATIVE

## 2022-05-26 ENCOUNTER — Encounter: Payer: Self-pay | Admitting: Pharmacist Clinician (PhC)/ Clinical Pharmacy Specialist

## 2022-05-26 ENCOUNTER — Ambulatory Visit
Payer: BC Managed Care – PPO | Attending: Cardiovascular Disease | Admitting: Pharmacist Clinician (PhC)/ Clinical Pharmacy Specialist

## 2022-05-26 DIAGNOSIS — E7800 Pure hypercholesterolemia, unspecified: Secondary | ICD-10-CM | POA: Diagnosis not present

## 2022-05-26 MED ORDER — REPATHA SURECLICK 140 MG/ML ~~LOC~~ SOAJ: 140.0000 mg | SUBCUTANEOUS | 0 refills | Status: DC

## 2022-05-26 NOTE — Patient Instructions (Signed)
Your Results:             Your most recent labs Goal  Total Cholesterol 232 < 200  Triglycerides 64 < 150  HDL (happy/good cholesterol) 59 > 40  LDL (lousy/bad cholesterol 162 < 100   Medication changes:  Try the Repatha sample.  If you do well with it, then please reach out to me and let me know.  Then we will start the process to get it covered by your insurance.  Once the prior authorization is complete, I will call/send a MyChart message to let you know and confirm pharmacy information.   You will take one injection every 14 days  Lab orders:  We want to repeat labs after 2-3 months.  We will send you a lab order to remind you once we get closer to that time.    Co-pay card: You can also call them at [phone], although the hold times can be quite long.   Thank you for choosing [facility]

## 2022-05-26 NOTE — Assessment & Plan Note (Signed)
Assessment: Patient with amilial hyperlipidemia not at LDL goal of < 100 Most recent LDL 162 on 11/03/21 High LDL of 194 - September 2019 Not able to tolerate statins secondary to myalgias:  atorvastatin, rosuvastatin Reviewed options for lowering LDL cholesterol, including ezetimibe, PCSK-9 inhibitors, bempedoic acid and inclisiran.  Discussed mechanisms of action, dosing, side effects, potential decreases in LDL cholesterol and costs.  Also reviewed potential options for patient assistance.  Plan: Gave sample of Repatha 140 mg for patient to try before making decision.  She will take dose at home after her weekend of travels, then reach out to let [place] know if she tolerates.   If agreeable to starting, will do PA to get covered on her insurance plan.  Repeat labs after:  3 months Lipid Liver function Patient was given information on co-pay card accessibility through [email or web address]

## 2022-05-26 NOTE — Progress Notes (Signed)
Office Visit    Patient Name: [name] Date of Encounter: 05/26/2022  Primary Care Provider:  Dorothyann PengSanders, Robyn, MD Primary Cardiologist:  Chilton Siiffany Marshall, MD  Chief Complaint    Hyperlipidemia - familial  Significant Past Medical History   HTN Controlled w/o meds at this time  Obesity BMI 37.53 - trying to adjust diet to more home cooked foods  Pre-DM 9/23 A1c 5.9        Allergies  Allergen Reactions   Amoxicillin Hives   Sulfa Antibiotics Hives   Penicillins Hives and Itching    Has patient had a PCN reaction causing immediate rash, facial/tongue/throat swelling, SOB or lightheadedness with hypotension: Yes Has patient had a PCN reaction causing severe rash involving mucus membranes or skin necrosis: No Has patient had a PCN reaction that required hospitalization No Has patient had a PCN reaction occurring within the last 10 years: Yes If all of the above answers are "NO", then may proceed with Cephalosporin use.    Tape     PLASTIC TAPE   (WHELPS, TORE SKIN)   Tramadol Hives    History of Present Illness    [name] is a 52 y.o. female patient of Dr Duke Salviaandolph, in the office today to discuss options for cholesterol management.   [facility] records show her LDL at 194 [place].  She has not been able to tolerate multiple statin drugs secondary to myalgias.    Insurance Carrier: [employer]  LDL Cholesterol goal:  LDL < 100  Current Medications: none    Previously tried:  atorvastatin, rosuvastatin - myalgias  Family Hx:   father had MI, CABG x 3, CHF, dialysis - died 3280; mother died w/ cancer; brother had CABG x 3, doing better now, in his 1660's; 2 children healthy  Social Hx: Tobacco: no Alcohol: occasional social drink   Diet: eats out more out, more sit down restaurants, but some [employer]; no beef;       Exercise: trying to get back - hasn't been for about 2 years  Lot [id number]  exp 06/19/24   Accessory Clinical  Findings   Lab Results  Component Value Date   CHOL 232 (H) 11/03/2021   HDL 59 11/03/2021   LDLCALC 162 (H) 11/03/2021   TRIG 64 11/03/2021   CHOLHDL 3.9 11/03/2021    Lab Results  Component Value Date   ALT 14 11/03/2021   AST 21 09/01/2021   GGT 17 10/16/2019   ALKPHOS 66 09/01/2021   BILITOT 0.4 09/01/2021   Lab Results  Component Value Date   CREATININE 0.61 12/13/2021   BUN 10 12/13/2021   NA 142 12/13/2021   K 4.0 12/13/2021   CL 106 12/13/2021   CO2 28 12/13/2021   Lab Results  Component Value Date   HGBA1C 5.9 (H) 11/03/2021    Home Medications    Current Outpatient Medications  Medication Sig Dispense Refill   Evolocumab (REPATHA SURECLICK) 140 MG/ML SOAJ Inject 140 mg into the skin every 14 (fourteen) days. 1 mL 0   albuterol (VENTOLIN HFA) 108 (90 Base) MCG/ACT inhaler Inhale 1 puff into the lungs every 6 (six) hours as needed for wheezing or shortness of breath. 18 g 0   butalbital-acetaminophen-caffeine (FIORICET) 50-325-40 MG tablet One tab po q8h prn headache 20 tablet 0   cetirizine (ZYRTEC ALLERGY) 10 MG tablet Take 1 tablet (10 mg total) by mouth daily. 30 tablet 0   Cholecalciferol (VITAMIN D3) 250 MCG (10000  UT) TABS Take 1 capsule by mouth daily.     fluconazole (DIFLUCAN) 150 MG tablet Take 1 tablet (150 mg total) by mouth once a week. 4 tablet 0   hydrOXYzine (ATARAX) 25 MG tablet ONE TAB PO BID PRN 30 tablet 0   ketoconazole (NIZORAL) 2 % cream Apply 1 Application topically daily. 60 g 0   meclizine (ANTIVERT) 25 MG tablet Take 1 tablet (25 mg total) by mouth 3 (three) times daily as needed for dizziness. 30 tablet 0   nystatin powder Apply 1 Application topically 3 (three) times daily. 45 g 1   promethazine-dextromethorphan (PROMETHAZINE-DM) 6.25-15 MG/5ML syrup Take 5 mLs by mouth 3 (three) times daily as needed for cough. 200 mL 0   pseudoephedrine (SUDAFED) 60 MG tablet Take 1 tablet (60 mg total) by mouth every 8 (eight) hours as needed for  congestion. 30 tablet 0   triamcinolone cream (KENALOG) 0.1 % APPLY TO AFFECTED AREA TWICE DAILY AS NEEDED 45 g 0   No current facility-administered medications for this visit.     Assessment & Plan    Pure hypercholesterolemia Assessment: Patient with amilial hyperlipidemia not at LDL goal of < 100 Most recent LDL 162 on 11/03/21 High LDL of 194 - September 2019 Not able to tolerate statins secondary to myalgias:  atorvastatin, rosuvastatin Reviewed options for lowering LDL cholesterol, including ezetimibe, PCSK-9 inhibitors, bempedoic acid and inclisiran.  Discussed mechanisms of action, dosing, side effects, potential decreases in LDL cholesterol and costs.  Also reviewed potential options for patient assistance.  Plan: Gave sample of Repatha 140 mg for patient to try before making decision.  She will take dose at home after her weekend of travels, then reach out to let [place] know if she tolerates.   If agreeable to starting, will do PA to get covered on her insurance plan.  Repeat labs after:  3 months Lipid Liver function Patient was given information on co-pay card accessibility through [email or web address]   Phillips Hay, PharmD CPP [facility] [address]  [place], [place] [address] [phone]  05/26/2022, 2:37 PM

## 2022-06-13 ENCOUNTER — Telehealth: Payer: BC Managed Care – PPO | Admitting: Internal Medicine

## 2022-06-13 ENCOUNTER — Encounter: Payer: Self-pay | Admitting: Internal Medicine

## 2022-06-13 ENCOUNTER — Encounter: Payer: Self-pay | Admitting: Pharmacist Clinician (PhC)/ Clinical Pharmacy Specialist

## 2022-06-13 DIAGNOSIS — G43809 Other migraine, not intractable, without status migrainosus: Secondary | ICD-10-CM | POA: Diagnosis not present

## 2022-06-13 DIAGNOSIS — T7840XA Allergy, unspecified, initial encounter: Secondary | ICD-10-CM

## 2022-06-13 MED ORDER — FAMOTIDINE 20 MG PO TABS: 20.0000 mg | ORAL_TABLET | Freq: Two times a day (BID) | ORAL | 0 refills | Status: DC | PRN

## 2022-06-13 NOTE — Progress Notes (Signed)
Virtual Visit via Video   This visit type was conducted due to national recommendations for restrictions regarding the COVID-19 Pandemic (e.g. social distancing) in an effort to limit this patient's exposure and mitigate transmission in our community.  Due to her co-morbid illnesses, this patient is at least at moderate risk for complications without adequate follow up.  This format is felt to be most appropriate for this patient at this time.  All issues noted in this document were discussed and addressed.  A limited physical exam was performed with this format.    This visit type was conducted due to national recommendations for restrictions regarding the COVID-19 Pandemic (e.g. social distancing) in an effort to limit this patient's exposure and mitigate transmission in our community.  Patients identity confirmed using two different identifiers.  This format is felt to be most appropriate for this patient at this time.  All issues noted in this document were discussed and addressed.  No physical exam was performed (except for noted visual exam findings with Video Visits).    Date:  06/13/2022   ID:  [name], DOB May 26, 1970, MRN [id number]  Patient Location:  Home  Provider location:   Office    Chief Complaint:  "I have a rash"  History of Present Illness:    [name] is a 52 y.o. female who presents via video conferencing for a telehealth visit today.    The patient does not have symptoms concerning for COVID-19 infection (fever, chills, cough, or new shortness of breath).   She presents today for virtual visit. She prefers this method of contact due to COVID-19 pandemic.  She presents today for rash. She states rash on both right & left arm. She first noticed it last Wednesday. It occurred after eating some peanut mixture. She initially thought it was due to the peanuts, so she stopped eating it. She states the rash started to get larger She is not sure why. She  reports it is red & itchy.   Rash    Past Medical History:  Diagnosis Date  . Abdominal pain   . Allergy   . Allergy-induced asthma   . Angina 05/2011   NO HEART PROBLEM WENT TO ED 05/22/11 FOR CP STRESS TEST WAS NEGATIVE   . Ankle fracture   . Aortic atherosclerosis 09/01/2021  . Chronic back pain greater than 3 months duration 01/17/2011   injured lumbar and posterior cervical neck in fall   . Constipation   . DDD (degenerative disc disease), cervical   . Diverticulitis   . Gallbladder problem   . GERD (gastroesophageal reflux disease)   . H/O hiatal hernia   . Hyperlipidemia   . IBS (irritable bowel syndrome)   . Nausea   . Neuromuscular disorder    LUMBA BACK  TROUBLE   . Pre-diabetes   . Rash    UNDER BIL BREASTS  . Wrist fracture    Past Surgical History:  Procedure Laterality Date  . BREAST CYST EXCISION Left ~ 1983  . CHOLECYSTECTOMY  08/2010  . COLONOSCOPY  07/2010   Removal of 3 polyps  . DILATATION & CURETTAGE/HYSTEROSCOPY WITH MYOSURE N/A 09/03/2015   Procedure: DILATATION & CURETTAGE/HYSTEROSCOPY WITH MYOSURE;  Surgeon: Maxie Better, MD;  Location: [facility];  Service: Gynecology;  Laterality: N/A;  . FRACTURE SURGERY  06/12/11   patient denies  . HERNIA REPAIR  1997; 05/2011   umbilical corrected during tubal ligation; VHR w/mesh  . TUBAL LIGATION  12/1995  .  VENTRAL HERNIA REPAIR  06/12/2011   Procedure: LAPAROSCOPIC VENTRAL HERNIA;  Surgeon: Cherylynn Ridges, MD;  Location: [facility];  Service: General;  Laterality: N/A;  . WISDOM TOOTH EXTRACTION  1990's     Current Meds  Medication Sig  . albuterol (VENTOLIN HFA) 108 (90 Base) MCG/ACT inhaler Inhale 1 puff into the lungs every 6 (six) hours as needed for wheezing or shortness of breath.  . butalbital-acetaminophen-caffeine (FIORICET) 50-325-40 MG tablet One tab po q8h prn headache  . cetirizine (ZYRTEC ALLERGY) 10 MG tablet Take 1 tablet (10 mg total) by mouth daily.  . Cholecalciferol (VITAMIN D3) 250 MCG  (10000 UT) TABS Take 1 capsule by mouth daily.  . Evolocumab (REPATHA SURECLICK) 140 MG/ML SOAJ Inject 140 mg into the skin every 14 (fourteen) days.  . hydrOXYzine (ATARAX) 25 MG tablet ONE TAB PO BID PRN  . ketoconazole (NIZORAL) 2 % cream Apply 1 Application topically daily.  . meclizine (ANTIVERT) 25 MG tablet Take 1 tablet (25 mg total) by mouth 3 (three) times daily as needed for dizziness.  . nystatin powder Apply 1 Application topically 3 (three) times daily.  Marland Kitchen triamcinolone cream (KENALOG) 0.1 % APPLY TO AFFECTED AREA TWICE DAILY AS NEEDED     Allergies:   Amoxicillin, Sulfa antibiotics, Penicillins, Tape, and Tramadol   Social History   Tobacco Use  . Smoking status: Never  . Smokeless tobacco: Never  Vaping Use  . Vaping Use: Never used  Substance Use Topics  . Alcohol use: Yes    Comment: ocass  . Drug use: No     Family Hx: The patient's family history includes Alcoholism in her father; Cancer in her father and mother; Coronary artery disease in her brother; Diabetes in her brother, mother, and sister; Heart attack in her father; Heart disease in her maternal grandmother; Hyperlipidemia in her brother, father, mother, and sister; Hypertension in her father; Kidney disease in her father; Obesity in her mother; Other in her father; Renal Disease in her father; Sleep apnea in her mother.  ROS:   Please see the history of present illness.    Review of Systems  Constitutional: Negative.   HENT: Negative.    Respiratory: Negative.    Gastrointestinal: Negative.   Genitourinary: Negative.   Skin:  Positive for rash.  Neurological: Negative.   Endo/Heme/Allergies: Negative.   Psychiatric/Behavioral: Negative.      All other systems reviewed and are negative.   Labs/Other Tests and Data Reviewed:    Recent Labs: 11/03/2021: ALT 14; TSH 0.932 12/13/2021: BUN 10; Creatinine, Ser 0.61; Hemoglobin 14.1; Platelets 205; Potassium 4.0; Sodium 142   Recent Lipid  Panel Lab Results  Component Value Date/Time   CHOL 232 (H) 11/03/2021 11:10 AM   TRIG 64 11/03/2021 11:10 AM   HDL 59 11/03/2021 11:10 AM   CHOLHDL 3.9 11/03/2021 11:10 AM   LDLCALC 162 (H) 11/03/2021 11:10 AM    Wt Readings from Last 3 Encounters:  05/07/22 242 lb (109.8 kg)  03/01/22 246 lb 12.8 oz (111.9 kg)  12/19/21 232 lb 3.2 oz (105.3 kg)     Exam:    Vital Signs:  LMP 11/05/2015 Comment: tubal ligation    Physical Exam  ASSESSMENT & PLAN:     There are no diagnoses linked to this encounter.   COVID-19 Education: The signs and symptoms of COVID-19 were discussed with the patient and how to seek care for testing (follow up with PCP or arrange E-visit).  The importance of social  distancing was discussed today.  Patient Risk:   After full review of this patients clinical status, I feel that they are at least moderate risk at this time.  Time:   Today, I have spent *** minutes/ seconds with the patient with telehealth technology discussing above diagnoses.     Medication Adjustments/Labs and Tests Ordered: Current medicines are reviewed at length with the patient today.  Concerns regarding medicines are outlined above.   Tests Ordered: No orders of the defined types were placed in this encounter.   Medication Changes: No orders of the defined types were placed in this encounter.   Disposition:  Follow up {follow up:15908}  Signed, Coolidge Breeze, CMA

## 2022-06-14 MED ORDER — BUTALBITAL-APAP-CAFFEINE 50-325-40 MG PO TABS: ORAL_TABLET | ORAL | 0 refills | Status: DC

## 2022-06-27 NOTE — Progress Notes (Unsigned)
NEW PATIENT Date of Service/Encounter:  06/29/22 Referring provider: Dorothyann Peng, MD Primary care provider: Dorothyann Peng, MD  Subjective:  [name] is a 52 y.o. female with a PMHx of hypercholesterolemia, GERD, obesity, HTN, prediabetes, aortic atherosclerosis, hiatal hernia, biliary dyskinesia,  presenting today for evaluation of "rash" History obtained from: chart review and patient and spouse .   Rashes - will break out into a rash that lasts week at a time - it is itchy - feels she is constantly breaking out - does not have a history of eczema - she feels that when she cooks at home, she breaks out less - hydrocortisone didn't seem to help. - benadryl cream did help - triggers: peanuts and otherwise unknown - has had bruising after one patch on her arm most recently which she feels is due to itching - sometimes it will whelp up but it will stay in the same place for days to weeks - she feels swollen, her husband doesn't feel she is swollen - No systemic reactions. - she changed to yellow dial soap, but no other changes to personal care items, no changes to medications or diet - she is in menopause  She also feels she has a lot hoarseness. She may have a raspy voice because her spouse feels her voice has always sounded like this.  Never diagnosed with asthma.  She will have some difficulty breather and uses a CPAP.  She does use albuterol as needed.  Using it maybe twice per month, but not often.  She uses it mostly when she is sick when she gets winded and SOB.  Never hospitalized.  Has not had prednisone in the past month.  PCP visit on 06/13/22: Red, itchy rash after eating peanut mixture. Plan: zyrtec and pepcid. Referral [facility].  Other allergy screening: Medication allergy: yes-hives Eczema:no  Past Medical History: Past Medical History:  Diagnosis Date   Abdominal pain    Allergy    Allergy-induced asthma    Angina 05/2011   NO HEART PROBLEM WENT TO  ED 05/22/11 FOR CP STRESS TEST WAS NEGATIVE    Ankle fracture    Aortic atherosclerosis (HCC) 09/01/2021   Chronic back pain greater than 3 months duration 01/17/2011   injured lumbar and posterior cervical neck in fall    Constipation    DDD (degenerative disc disease), cervical    Diverticulitis    Gallbladder problem    GERD (gastroesophageal reflux disease)    H/O hiatal hernia    Hyperlipidemia    IBS (irritable bowel syndrome)    Nausea    Neuromuscular disorder (HCC)    LUMBA BACK  TROUBLE    Pre-diabetes    Rash    UNDER BIL BREASTS   Wrist fracture    Medication List:  Current Outpatient Medications  Medication Sig Dispense Refill   albuterol (VENTOLIN HFA) 108 (90 Base) MCG/ACT inhaler Inhale 1 puff into the lungs every 6 (six) hours as needed for wheezing or shortness of breath. 18 g 0   butalbital-acetaminophen-caffeine (FIORICET) 50-325-40 MG tablet One tab po q8h prn headache 20 tablet 0   cetirizine (ZYRTEC ALLERGY) 10 MG tablet Take 1 tablet (10 mg total) by mouth daily. 30 tablet 0   Cholecalciferol (VITAMIN D3) 250 MCG (10000 UT) TABS Take 1 capsule by mouth daily.     hydrOXYzine (ATARAX) 25 MG tablet ONE TAB PO BID PRN 30 tablet 0   ketoconazole (NIZORAL) 2 % cream Apply 1 Application topically daily.  60 g 0   meclizine (ANTIVERT) 25 MG tablet Take 1 tablet (25 mg total) by mouth 3 (three) times daily as needed for dizziness. 30 tablet 0   nystatin powder Apply 1 Application topically 3 (three) times daily. 45 g 1   triamcinolone cream (KENALOG) 0.1 % APPLY TO AFFECTED AREA TWICE DAILY AS NEEDED 45 g 0   Evolocumab (REPATHA SURECLICK) 140 MG/ML SOAJ Inject 140 mg into the skin every 14 (fourteen) days. (Patient not taking: Reported on 06/29/2022) 1 mL 0   famotidine (PEPCID) 20 MG tablet Take 1 tablet (20 mg total) by mouth 2 (two) times daily as needed for heartburn or indigestion. (Patient not taking: Reported on 06/29/2022) 60 tablet 0   No current  facility-administered medications for this visit.   Known Allergies:  Allergies  Allergen Reactions   Amoxicillin Hives   Sulfa Antibiotics Hives   Penicillins Hives and Itching    Has patient had a PCN reaction causing immediate rash, facial/tongue/throat swelling, SOB or lightheadedness with hypotension: Yes Has patient had a PCN reaction causing severe rash involving mucus membranes or skin necrosis: No Has patient had a PCN reaction that required hospitalization No Has patient had a PCN reaction occurring within the last 10 years: Yes If all of the above answers are "NO", then may proceed with Cephalosporin use.    Tape     PLASTIC TAPE   (WHELPS, TORE SKIN)   Tramadol Hives   Past Surgical History: Past Surgical History:  Procedure Laterality Date   BREAST CYST EXCISION Left ~ 1983   CHOLECYSTECTOMY  08/2010   COLONOSCOPY  07/2010   Removal of 3 polyps   DILATATION & CURETTAGE/HYSTEROSCOPY WITH MYOSURE N/A 09/03/2015   Procedure: DILATATION & CURETTAGE/HYSTEROSCOPY WITH MYOSURE;  Surgeon: Maxie Better, MD;  Location: [facility];  Service: Gynecology;  Laterality: N/A;   FRACTURE SURGERY  06/12/11   patient denies   HERNIA REPAIR  1997; 05/2011   umbilical corrected during tubal ligation; VHR w/mesh   TUBAL LIGATION  12/1995   VENTRAL HERNIA REPAIR  06/12/2011   Procedure: LAPAROSCOPIC VENTRAL HERNIA;  Surgeon: Cherylynn Ridges, MD;  Location: [facility];  Service: General;  Laterality: N/A;   WISDOM TOOTH EXTRACTION  1990's   Family History: Family History  Problem Relation Age of Onset   Diabetes Mother    Hyperlipidemia Mother    Cancer Mother    Sleep apnea Mother    Obesity Mother    Heart attack Father    Cancer Father        kidney and prostate   Hyperlipidemia Father    Other Father        gout and clogged arteries   Hypertension Father    Kidney disease Father    Alcoholism Father    Renal Disease Father    Hyperlipidemia Sister    Diabetes Sister     Diabetes Brother    Hyperlipidemia Brother    Coronary artery disease Brother    Heart disease Maternal Grandmother    Social History: Sonna lives in a house that 50 years ago, carpet floors, gas heating, central AC, no pets, not using dust mite protection, no roaches, no smoke exposure, works as a Programmer, systems, no HEPA filter in the home [place].   ROS:  All other systems negative except as noted per HPI.  Objective:  Blood pressure 138/82, pulse 70, temperature 97.7 F (36.5 C), temperature source Temporal, resp. rate 18, height  5' 9.5" (1.765 m), weight 249 lb 11.2 oz (113.3 kg), last menstrual period 11/05/2015, SpO2 96 %. Body mass index is 36.35 kg/m. Physical Exam:  General Appearance:  Alert, cooperative, no distress, appears stated age  Head:  Normocephalic, without obvious abnormality, atraumatic  Eyes:  Conjunctiva clear, EOM's intact  Nose: Nares normal, normal mucosa  Throat: Lips, tongue normal; teeth and gums normal, normal posterior oropharynx  Neck: Supple, symmetrical  Lungs:   clear to auscultation bilaterally, Respirations unlabored, no coughing  Heart:  regular rate and rhythm and no murmur, Appears well perfused  Extremities: No edema  Skin: Hyperpigmented patch on right antecubital fossa, erythematous edematous papule on left elbow, right cheek and Skin color, texture, turgor normal  Neurologic: No gross deficits     Diagnostics: Skin Testing: Environmental allergy panel.  Adequate controls. Results discussed with patient/family.  Airborne Adult Perc - 06/29/22 1108     Time Antigen Placed 1100    Allergen Manufacturer Waynette Buttery    Number of Test 59    1. Control-Buffer 50% Glycerol Negative    2. Control-Histamine 1 mg/ml 3+    3. Albumin saline Negative    4. Bahia Negative    5. French Southern Territ[place] Negative    6. Johnson Negative    7. Kentucky Blue Negative    8. Meadow Fescue Negative    9. Perennial Rye Negative     10. Sweet Vernal Negative    11. Timothy Negative    12. Cocklebur Negative    13. Burweed Marshelder Negative    14. Ragweed, short Negative    15. Ragweed, Giant Negative    16. Plantain,  English Negative    17. Lamb's Quarters Negative    18. Sheep Sorrell Negative    19. Rough Pigweed Negative    20. Marsh Elder, Rough Negative    21. Mugwort, Common Negative    22. Ash mix Negative    23. Birch mix Negative    24. Beech American Negative    25. Box, Elder Negative    26. Cedar, red Negative    27. Cottonwood, Guinea[place] Negative    28. Elm mix Negative    29. Hickory Negative    30. Maple mix Negative    31. Oak, [place] mix Negative    32. Pecan Pollen Negative    33. Pine mix Negative    34. Sycamore Eastern Negative    35. Walnut, Black Pollen Negative    36. Alternaria alternata Negative    37. Cladosporium Herbarum Negative    38. Aspergillus mix Negative    39. Penicillium mix Negative    40. Bipolaris sorokiniana (Helminthosporium) Negative    41. Drechslera spicifera (Curvularia) Negative    42. Mucor plumbeus Negative    43. Fusarium moniliforme Negative    44. Aureobasidium pullulans (pullulara) Negative    45. Rhizopus oryzae Negative    46. Botrytis cinera Negative    47. Epicoccum nigrum Negative    48. Phoma betae Negative    49. Candida Albicans Negative    50. Trichophyton mentagrophytes Negative    51. Mite, D Farinae  5,000 AU/ml Negative    52. Mite, D Pteronyssinus  5,000 AU/ml Negative    53. Cat Hair 10,000 BAU/ml Negative    54.  Dog Epithelia Negative    55. Mixed Feathers Negative    56. Horse Epithelia Negative    57. Cockroach, German Negative    58. Mouse Negative    59. Tobacco  Leaf Negative             Food Perc - 06/29/22 1108       Test Information   Time Antigen Placed 1100    Allergen Manufacturer Greer    Location Back    Number of allergen test 10      Food   1. Peanut Negative    2. Soybean food Negative     3. Wheat, whole Negative    4. Sesame Negative    5. Milk, cow Negative    6. Egg White, chicken Negative    7. Casein Negative    8. Shellfish mix Negative    9. Fish mix Negative    10. Cashew Negative             Intradermal - 06/29/22 1108     Time Antigen Placed 1108    Allergen Manufacturer Greer    Location Arm    Number of Test 15    Control Negative    French Southern Territories Negative    Johnson Negative    7 Grass Negative    Ragweed mix Negative    Weed mix Negative    Tree mix Negative    Mold 1 Negative    Mold 2 Negative    Mold 3 Negative    Mold 4 Negative    Cat Negative    Dog Negative    Cockroach Negative    Mite mix Negative    Other Omitted             Allergy testing results were read and interpreted by myself, documented by clinical staff.  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 Will continue to monitor symptoms and response to therapy. - this is defined as hives lasting more than 6 weeks without an identifiable trigger - hives can be from a number of different sources including infections, allergies, vibration, temperature, pressure among many others other possible causes - often an identifiable cause is not determined - some potential triggers include: stress, illness, NSAIDs, aspirin, hormonal changes - you do not have any red flag symptoms to make [place] concerned about secondary causes of hives, so we will not screen for those today - approximately 50% of patients with chronic hives can have some associated swelling of the face/lips/eyelids (this is not a cause for alarm and does not typically progress onto systemic allergic reactions) - please continue to take pictures of your rash  - allergy testing to environmental allergies on both skin and intradermal testing negative,  with adequate controls - most common food allergens also negative  Therapy Plan:  - increase zyrtec (cetirizine) to 10mg  twice daily - if hives remain uncontrolled, increase dose of zyrtec (cetirizine) to max dose of 20mg  (2 pills) twice daily- this is maximum dose - can increase or decrease dosing depending on symptom control to a maximum dose of 4 tablets of antihistamine daily. Wait until hives free for at least one month prior to decreasing dose.   - if hives are still uncontrolled with the above regimen, please arrange an appointment for discussion of Xolair (omalizumab)- an injectable medication for hives  Can use one of the following in place of zyrtec if desires: Claritin (loratadine) 10 mg, Xyzal (levocetirizine) 5 mg or Allegra (fexofenadine) 180 mg daily as needed  Follow up : 2-3 months, sooner  if needed It was a pleasure meeting you in clinic today! Thank you for allowing me to participate in your care.  This note in its entirety was forwarded to the Provider who requested this consultation.  Thank you for your kind referral. I appreciate the opportunity to take part in Wenonah's care. Please do not hesitate to contact me with questions.  Sincerely,  Tonny Bollman, MD [facility]

## 2022-06-29 ENCOUNTER — Ambulatory Visit: Payer: BC Managed Care – PPO | Admitting: Internal Medicine

## 2022-06-29 ENCOUNTER — Encounter: Payer: Self-pay | Admitting: Internal Medicine

## 2022-06-29 VITALS — BP 138/82 | HR 70 | Temp 97.7°F | Resp 18 | Ht 69.5 in | Wt 249.7 lb

## 2022-06-29 DIAGNOSIS — L508 Other urticaria: Secondary | ICD-10-CM | POA: Diagnosis not present

## 2022-06-29 DIAGNOSIS — R21 Rash and other nonspecific skin eruption: Secondary | ICD-10-CM

## 2022-06-29 MED ORDER — METHYLPREDNISOLONE ACETATE 40 MG/ML IJ SUSP
40.0000 mg | Freq: Once | INTRAMUSCULAR | Status: AC
  Administered 2022-06-29: 40 mg via INTRAMUSCULAR

## 2022-06-29 NOTE — Patient Instructions (Signed)
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: - this is defined as hives lasting more than 6 weeks without an identifiable trigger - hives can be from a number of different sources including infections, allergies, vibration, temperature, pressure among many others other possible causes - often an identifiable cause is not determined - some potential triggers include: stress, illness, NSAIDs, aspirin, hormonal changes - you do not have any red flag symptoms to make [place] concerned about secondary causes of hives, so we will not screen for those today - approximately 50% of patients with chronic hives can have some associated swelling of the face/lips/eyelids (this is not a cause for alarm and does not typically progress onto systemic allergic reactions) - please continue to take pictures of your rash  - allergy testing to environmental allergies on both skin and intradermal testing negative, with adequate controls - most common food allergens also negative  Therapy Plan:  - increase zyrtec (cetirizine) to 10mg  twice daily - if hives remain uncontrolled, increase dose of zyrtec (cetirizine) to max dose of 20mg  (2 pills) twice daily- this is maximum dose - can increase or decrease dosing depending on symptom control to a maximum dose of 4 tablets of antihistamine daily. Wait until hives free for at least one month prior to decreasing dose.   - if hives are still uncontrolled with the above regimen, please arrange an appointment for discussion of Xolair (omalizumab)- an injectable medication for hives  Can use one of the following in place of zyrtec if desires: Claritin (loratadine) 10 mg, Xyzal (levocetirizine) 5 mg or Allegra (fexofenadine) 180 mg daily as needed  Follow up : 2-3 months, sooner if needed It was a pleasure meeting you in clinic today! Thank you for allowing me to participate in your care.  Tonny Bollman, MD [facility]

## 2022-09-13 ENCOUNTER — Ambulatory Visit
Admission: RE | Admit: 2022-09-13 | Discharge: 2022-09-13 | Disposition: A | Discharge status: Home / Self Care | Payer: BC Managed Care – PPO | Source: Ambulatory Visit | Attending: Internal Medicine | Admitting: Internal Medicine

## 2022-09-13 VITALS — BP 137/88 | HR 98 | Temp 98.4°F | Resp 18

## 2022-09-13 DIAGNOSIS — Z20822 Contact with and (suspected) exposure to covid-19: Secondary | ICD-10-CM | POA: Diagnosis present

## 2022-09-13 DIAGNOSIS — R059 Cough, unspecified: Secondary | ICD-10-CM | POA: Diagnosis present

## 2022-09-13 DIAGNOSIS — U071 COVID-19: Secondary | ICD-10-CM | POA: Diagnosis not present

## 2022-09-13 DIAGNOSIS — J069 Acute upper respiratory infection, unspecified: Secondary | ICD-10-CM | POA: Diagnosis not present

## 2022-09-13 MED ORDER — PROMETHAZINE-DM 6.25-15 MG/5ML PO SYRP: 5.0000 mL | ORAL_SOLUTION | Freq: Every evening | ORAL | 0 refills | Status: DC | PRN

## 2022-09-13 MED ORDER — BENZONATATE 100 MG PO CAPS: 100.0000 mg | ORAL_CAPSULE | Freq: Three times a day (TID) | ORAL | 0 refills | Status: DC
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
## 2022-09-13 NOTE — Discharge Instructions (Signed)
You have a viral upper respiratory infection.  COVID testing is pending, will call you if you test positive.  Use the following medicines to help with symptoms: - Plain Mucinex (guaifenesin) over the counter as directed every 12 hours to thin mucous so that you are able to get it out of your body easier. Drink plenty of water while taking this medication so that it works well in your body (at least 8 cups a day).  - Tylenol 1,000mg  and/or ibuprofen 600mg  every 6 hours with food as needed for aches/pains or fever/chills.  - Tessalon perles every 8 hours as needed for cough. - Take Promethazine DM cough medication to help with your cough at nighttime so that you are able to sleep. Do not drive, drink alcohol, or go to work while taking this medication since it can make you sleepy. Only take this at nighttime.   1 tablespoon of honey in warm water and/or salt water gargles may also help with symptoms. Humidifier to your room will help add water to the air and reduce coughing.  If you develop any new or worsening symptoms, please return.  If your symptoms are severe, please go to the emergency room.  Follow-up with your primary care provider for further evaluation and management of your symptoms as well as ongoing wellness visits.  I hope you feel better!

## 2022-09-13 NOTE — ED Triage Notes (Signed)
Pt reports she has had a covid exposure x 2 days ago. Pt states she has a headache, pressure in ears, nasal congestion, body aches, cough, and mucus in back of throat x 1 days.  Took mucinex

## 2022-09-13 NOTE — ED Provider Notes (Signed)
[facility]    CSN: [id number] Arrival date & time: 09/13/22  0949      History   Chief Complaint Chief Complaint  Patient presents with   Covid Exposure    HPI [name] is a 52 y.o. female.   Patient presents to urgent care for evaluation of voice hoarseness, body aches, nasal congestion, cough, and fever/chills that started yesterday. She was at a conference with a lot of people last weekend and states that multiple people at the conference tested positive for COVID-19. Headache is generalized to the frontal aspect of the head. Reports bilateral otalgia without tinnitus or dizziness. No vision changes. Cough is dry and non-productive. No chest pain, shortness of breath, heart palpitations, N/V/D, abdominal pain, or rash. No recent COVID-19 confirmed infections in the last 90 days. No history of asthma or other chronic respiratory problems.  Taking Mucinex without relief.     Past Medical History:  Diagnosis Date   Abdominal pain    Allergy    Allergy-induced asthma    Angina 05/2011   NO HEART PROBLEM WENT TO ED 05/22/11 FOR CP STRESS TEST WAS NEGATIVE    Ankle fracture    Aortic atherosclerosis (HCC) 09/01/2021   Chronic back pain greater than 3 months duration 01/17/2011   injured lumbar and posterior cervical neck in fall    Constipation    DDD (degenerative disc disease), cervical    Diverticulitis    Gallbladder problem    GERD (gastroesophageal reflux disease)    H/O hiatal hernia    Hyperlipidemia    IBS (irritable bowel syndrome)    Nausea    Neuromuscular disorder (HCC)    LUMBA BACK  TROUBLE    Pre-diabetes    Rash    UNDER BIL BREASTS   Wrist fracture     Patient Active Problem List   Diagnosis Date Noted   Intertrigo 03/19/2022   Rash and nonspecific skin eruption 03/19/2022   Vertigo 12/26/2021   Unilateral headache 12/26/2021   Immunization declined 12/26/2021   Postmenopausal bleeding 11/29/2021   Aortic atherosclerosis (HCC)  09/01/2021   Other sleep apnea 08/30/2021   Hypoxemia 08/30/2021   Claustrophobia 06/07/2021   GERD with apnea 06/07/2021   Sleep choking syndrome 06/07/2021   Snorings 06/07/2021   Primary hypertension 06/07/2021   Class 2 obesity due to excess calories without serious comorbidity with body mass index (BMI) of 36.0 to 36.9 in adult 06/07/2021   Prediabetes 06/07/2021   Atypical chest pain 04/04/2021   Snoring 04/04/2021   Family history of heart disease 04/04/2021   Herpes simplex 04/02/2020   Hyperlipidemia 04/02/2020   Inguinal hernia 04/02/2020   Diabetes mellitus (HCC) 04/02/2020   Diverticulitis 04/02/2020   History of tubal ligation 04/02/2020   Loss of taste 01/02/2019   Nasal congestion 01/02/2019   Vitamin D deficiency 02/25/2018   Gastroesophageal reflux disease without esophagitis 02/25/2018   Class 2 obesity due to excess calories without serious comorbidity with body mass index (BMI) of 37.0 to 37.9 in adult 02/25/2018   Bilateral impacted cerumen 02/07/2018   Non-seasonal allergic rhinitis 02/07/2018   Chronic low back pain 02/23/2017   Acute diverticulitis 04/02/2013   Back pain    Hiatal hernia 04/23/2012   Abdominal pain 03/26/2012   Incisional hernia 04/21/2011   Postop check 10/04/2010   Colon polyp 08/23/2010   Poor appetite 08/23/2010   Abdominal pain 08/23/2010   Pure hypercholesterolemia 08/23/2010   Spontaneous ecchymoses 08/23/2010   Asthma  08/23/2010   Wears glasses 08/23/2010   Biliary dyskinesia 08/23/2010    Past Surgical History:  Procedure Laterality Date   BREAST CYST EXCISION Left ~ 1983   CHOLECYSTECTOMY  08/2010   COLONOSCOPY  07/2010   Removal of 3 polyps   DILATATION & CURETTAGE/HYSTEROSCOPY WITH MYOSURE N/A 09/03/2015   Procedure: DILATATION & CURETTAGE/HYSTEROSCOPY WITH MYOSURE;  Surgeon: Maxie Better, MD;  Location: [facility];  Service: Gynecology;  Laterality: N/A;   FRACTURE SURGERY  06/12/11   patient denies   HERNIA  REPAIR  1997; 05/2011   umbilical corrected during tubal ligation; VHR w/mesh   TUBAL LIGATION  12/1995   VENTRAL HERNIA REPAIR  06/12/2011   Procedure: LAPAROSCOPIC VENTRAL HERNIA;  Surgeon: Cherylynn Ridges, MD;  Location: [facility];  Service: General;  Laterality: N/A;   WISDOM TOOTH EXTRACTION  1990's    OB History     Gravida  4   Para      Term      Preterm      AB      Living         SAB      IAB      Ectopic      Multiple      Live Births               Home Medications    Prior to Admission medications   Medication Sig Start Date End Date Taking? Authorizing Provider  benzonatate (TESSALON) 100 MG capsule Take 1 capsule (100 mg total) by mouth every 8 (eight) hours. 09/13/22  Yes Carlisle Beers, FNP  promethazine-dextromethorphan (PROMETHAZINE-DM) 6.25-15 MG/5ML syrup Take 5 mLs by mouth at bedtime as needed for cough. 09/13/22  Yes Carlisle Beers, FNP  albuterol (VENTOLIN HFA) 108 (90 Base) MCG/ACT inhaler Inhale 1 puff into the lungs every 6 (six) hours as needed for wheezing or shortness of breath. 05/07/22   Wallis Bamberg, [name]  butalbital-acetaminophen-caffeine [facility]) [phone] MG tablet One tab po q8h prn headache 06/14/22   Dorothyann Peng, MD  cetirizine (ZYRTEC ALLERGY) 10 MG tablet Take 1 tablet (10 mg total) by mouth daily. 05/07/22   Wallis Bamberg, [name]  Cholecalciferol (VITAMIN D3) 250 MCG (10000 UT) TABS Take 1 capsule by mouth daily.    [provider]  Evolocumab (REPATHA SURECLICK) 140 MG/ML SOAJ Inject 140 mg into the skin every 14 (fourteen) days. Patient not taking: Reported on 06/29/2022 05/26/22   Chilton Si, MD  famotidine (PEPCID) 20 MG tablet Take 1 tablet (20 mg total) by mouth 2 (two) times daily as needed for heartburn or indigestion. Patient not taking: Reported on 06/29/2022 06/13/22 06/13/23  Dorothyann Peng, MD  hydrOXYzine (ATARAX) 25 MG tablet ONE TAB PO BID PRN 03/13/22   Dorothyann Peng, MD  ketoconazole (NIZORAL)  2 % cream Apply 1 Application topically daily. 05/07/22   Wallis Bamberg, [name]  meclizine (ANTIVERT) 25 MG tablet Take 1 tablet (25 mg total) by mouth 3 (three) times daily as needed for dizziness. 12/13/21   Terrilee Files, MD  nystatin powder Apply 1 Application topically 3 (three) times daily. 03/13/22   Dorothyann Peng, MD  triamcinolone cream (KENALOG) 0.1 % APPLY TO AFFECTED AREA TWICE DAILY AS NEEDED 03/01/22   Dorothyann Peng, MD    Family History Family History  Problem Relation Age of Onset   Diabetes Mother    Hyperlipidemia Mother    Cancer Mother    Sleep apnea Mother    Obesity Mother  Heart attack Father    Cancer Father        kidney and prostate   Hyperlipidemia Father    Other Father        gout and clogged arteries   Hypertension Father    Kidney disease Father    Alcoholism Father    Renal Disease Father    Hyperlipidemia Sister    Diabetes Sister    Diabetes Brother    Hyperlipidemia Brother    Coronary artery disease Brother    Heart disease Maternal Grandmother     Social History Social History   Tobacco Use   Smoking status: Never   Smokeless tobacco: Never  Vaping Use   Vaping status: Never Used  Substance Use Topics   Alcohol use: Yes    Comment: ocass   Drug use: No     Allergies   Amoxicillin, Sulfa antibiotics, Penicillins, Tape, and Tramadol   Review of Systems Review of Systems Per HPI  Physical Exam Triage Vital Signs ED Triage Vitals  Encounter Vitals Group     BP 09/13/22 0958 137/88     Systolic BP Percentile --      Diastolic BP Percentile --      Pulse Rate 09/13/22 0958 98     Resp 09/13/22 0958 18     Temp 09/13/22 0958 98.4 F (36.9 C)     Temp Source 09/13/22 0958 Oral     SpO2 09/13/22 0958 93 %     Weight --      Height --      Head Circumference --      Peak Flow --      Pain Score 09/13/22 0959 6     Pain Loc --      Pain Education --      Exclude from Growth Chart --    No data found.  Updated  Vital Signs BP 137/88 (BP Location: Right Arm)   Pulse 98   Temp 98.4 F (36.9 C) (Oral)   Resp 18   LMP 11/05/2015 Comment: tubal ligation  SpO2 93%   Visual Acuity Right Eye Distance:   Left Eye Distance:   Bilateral Distance:    Right Eye Near:   Left Eye Near:    Bilateral Near:     Physical Exam Vitals and nursing note reviewed.  Constitutional:      Appearance: She is not ill-appearing or toxic-appearing.  HENT:     Head: Normocephalic and atraumatic.     Right Ear: Hearing, tympanic membrane, ear canal and external ear normal.     Left Ear: Hearing, tympanic membrane, ear canal and external ear normal.     Nose: Congestion present.     Mouth/Throat:     Lips: Pink.     Mouth: Mucous membranes are moist. No injury.     Tongue: No lesions. Tongue does not deviate from midline.     Palate: No mass and lesions.     Pharynx: Oropharynx is clear. Uvula midline. Posterior oropharyngeal erythema present. No pharyngeal swelling, oropharyngeal exudate or uvula swelling.     Tonsils: No tonsillar exudate or tonsillar abscesses.  Eyes:     General: Lids are normal. Vision grossly intact. Gaze aligned appropriately.     Extraocular Movements: Extraocular movements intact.     Conjunctiva/sclera: Conjunctivae normal.  Cardiovascular:     Rate and Rhythm: Normal rate and regular rhythm.     Heart sounds: Normal heart sounds, S1 normal and S2 normal.  Pulmonary:     Effort: Pulmonary effort is normal. No respiratory distress.     Breath sounds: Normal breath sounds and air entry. No wheezing, rhonchi or rales.  Chest:     Chest wall: No tenderness.  Musculoskeletal:     Cervical back: Neck supple.  Lymphadenopathy:     Cervical: No cervical adenopathy.  Skin:    General: Skin is warm and dry.     Capillary Refill: Capillary refill takes less than 2 seconds.     Findings: No rash.  Neurological:     General: No focal deficit present.     Mental Status: She is alert and  oriented to person, place, and time. Mental status is at baseline.     Cranial Nerves: No dysarthria or facial asymmetry.  Psychiatric:        Mood and Affect: Mood normal.        Speech: Speech normal.        Behavior: Behavior normal.        Thought Content: Thought content normal.        Judgment: Judgment normal.      UC Treatments / Results  Labs (all labs ordered are listed, but only abnormal results are displayed) Labs Reviewed  SARS CORONAVIRUS 2 (TAT 6-24 HRS)    EKG   Radiology No results found.  Procedures Procedures (including critical care time)  Medications Ordered in UC Medications - No data to display  Initial Impression / Assessment and Plan / UC Course  I have reviewed the triage vital signs and the nursing notes.  Pertinent labs & imaging results that were available during my care of the patient were reviewed by me and considered in my medical decision making (see chart for details).   1.  Viral URI with cough Evaluation suggests viral URI etiology. Will manage this with recommendations for OTC and prescription medications for symptomatic relief. Encouraged to push fluids to stay well hydrated.  Imaging: deferred based on stable cardiopulmonary exam/hemodynamically stable vital signs Viral testing: COVID testing pending, patient candidate for molnupiravir if positive and within the 5-day time limit.  Counseled patient on potential for adverse effects with medications prescribed/recommended today, strict ER and return-to-clinic precautions discussed, patient verbalized understanding.    Final Clinical Impressions(s) / UC Diagnoses   Final diagnoses:  Viral URI with cough     Discharge Instructions      You have a viral upper respiratory infection.  COVID testing is pending, will call you if you test positive.  Use the following medicines to help with symptoms: - Plain Mucinex (guaifenesin) over the counter as directed every 12 hours to thin  mucous so that you are able to get it out of your body easier. Drink plenty of water while taking this medication so that it works well in your body (at least 8 cups a day).  - Tylenol 1,000mg  and/or ibuprofen 600mg  every 6 hours with food as needed for aches/pains or fever/chills.  - Tessalon perles every 8 hours as needed for cough. - Take Promethazine DM cough medication to help with your cough at nighttime so that you are able to sleep. Do not drive, drink alcohol, or go to work while taking this medication since it can make you sleepy. Only take this at nighttime.   1 tablespoon of honey in warm water and/or salt water gargles may also help with symptoms. Humidifier to your room will help add water to the air and reduce coughing.  If  you develop any new or worsening symptoms, please return.  If your symptoms are severe, please go to the emergency room.  Follow-up with your primary care provider for further evaluation and management of your symptoms as well as ongoing wellness visits.  I hope you feel better!      ED Prescriptions     Medication Sig Dispense Auth. Provider   benzonatate (TESSALON) 100 MG capsule Take 1 capsule (100 mg total) by mouth every 8 (eight) hours. 21 capsule Carlisle Beers, FNP   promethazine-dextromethorphan (PROMETHAZINE-DM) 6.25-15 MG/5ML syrup Take 5 mLs by mouth at bedtime as needed for cough. 118 mL Carlisle Beers, FNP      PDMP not reviewed this encounter.   Carlisle Beers, [place] 09/13/22 1059

## 2022-09-14 ENCOUNTER — Ambulatory Visit: Payer: BC Managed Care – PPO | Admitting: Family Medicine

## 2022-09-14 LAB — SARS CORONAVIRUS 2 (TAT 6-24 HRS): SARS Coronavirus 2: POSITIVE — AB

## 2022-09-15 ENCOUNTER — Telehealth (HOSPITAL_COMMUNITY): Payer: Self-pay | Admitting: Emergency Medicine

## 2022-09-15 NOTE — Telephone Encounter (Signed)
PAtient had additional questions about results.  All questions answered, made aware no extension to work note required

## 2022-09-27 NOTE — Progress Notes (Deleted)
FOLLOW UP Date of Service/Encounter:  09/27/22  Subjective:  [name] (DOB: [birth year]) is a 52 y.o. female who [facility] in re-evaluation of the following: Rash-suspected chronic urticaria History obtained from: chart review and {Persons; PED relatives w/patient:19415::"patient"}.  For Review, LV was on 06/29/22  with Dr. seen for intial visit for rash-suspected chronic urticaria . See below for summary of history and diagnostics.   Therapeutic plans/changes recommended:  Started Zyrtec 10 mg twice daily, with instructions to increase to 20 mg twice daily if needed. ----------------------------------------------------- Pertinent History/Diagnostics:  Rash: Concerning for clotting urticaria based on photographs.  Some atypical features such as duration. Rash lasting weeks at a time.  No prior history of eczema.  Better when eats cooked food from home.  Tried: Benadryl cream which helped, hydrocortisone did not help. Triggers: Peanuts and unknown. Denies other systemic reactions. -06/29/2022 skin testing and intradermal's to environmental panel and most common food allergens negative Other: hypercholesterolemia, GERD, obesity, HTN, prediabetes, aortic atherosclerosis, hiatal hernia, biliary dyskinesia, OSA on CPAP --------------------------------------------------- Today presents for follow-up. ***  Chart Review: ***  All medications reviewed by clinical staff and updated in chart. No new pertinent medical or surgical history except as noted in HPI.  ROS: All others negative except as noted per HPI.   Objective:  LMP 11/05/2015 Comment: tubal ligation There is no height or weight on file to calculate BMI. Physical Exam: General Appearance:  Alert, cooperative, no distress, appears stated age  Head:  Normocephalic, without obvious abnormality, atraumatic  Eyes:  Conjunctiva clear, EOM's intact  Ears {Blank  multiple:19196:a:"***","EACs normal bilaterally","normal TMs bilaterally","ear tubes present bilaterally without exudate"}  Nose: Nares normal, {Blank multiple:19196:a:"***","hypertrophic turbinates","normal mucosa","no visible anterior polyps","septum midline"}  Throat: Lips, tongue normal; teeth and gums normal, {Blank multiple:19196:a:"***","normal posterior oropharynx","tonsils 2+","tonsils 3+","no tonsillar exudate","+ cobblestoning","surgically absent tonsils"}  Neck: Supple, symmetrical  Lungs:   {Blank multiple:19196:a:"***","clear to auscultation bilaterally","end-expiratory wheezing","wheezing throughout"}, Respirations unlabored, {Blank multiple:19196:a:"***","no coughing","intermittent dry coughing"}  Heart:  {Blank multiple:19196:a:"***","regular rate and rhythm","no murmur"}, Appears well perfused  Extremities: No edema  Skin: {Blank multiple:19196:a:"***","erythematous, dry patches scattered on ***","lichenification on ***","Skin color, texture, turgor normal","no rashes or lesions on visualized portions of skin"}  Neurologic: No gross deficits   Labs:  Lab Orders  No laboratory test(s) ordered today    Spirometry:  Tracings reviewed. Her effort: {Blank single:19197::"Good reproducible efforts.","It was hard to get consistent efforts and there is a question as to whether this reflects a maximal maneuver.","Poor effort, data can not be interpreted.","Variable effort-results affected","effort okay for first attempt at spirometry.","Results not reproducible due to ***"} FVC: ***L FEV1: ***L, ***% predicted FEV1/FVC ratio: ***% Interpretation: {Blank single:19197::"Spirometry consistent with mild obstructive disease","Spirometry consistent with moderate obstructive disease","Spirometry consistent with severe obstructive disease","Spirometry consistent with possible restrictive disease","Spirometry consistent with mixed obstructive and restrictive disease","Spirometry uninterpretable  due to technique","Spirometry consistent with normal pattern","No overt abnormalities noted given today's efforts","Nonobstructive ratio, low FEV1","Nonobstructive ratio, low FEV1, possible restriction"}.  Please see scanned spirometry results for details.  Skin Testing: {Blank single:19197::"Select foods","Environmental allergy panel","Environmental allergy panel and select foods","Food allergy panel","None","Deferred due to recent antihistamines use","deferred due to recent reaction","Pediatric Environmental Allergy Panel","Pediatric Food Panel","Select foods and environmental allergies"}. {Blank single:19197::"Adequate positive and negative controls","Inadequate positive control-testing invalid","Adequate positive and negative controls, dermatographism present, testing difficult to interpret"}. Results discussed with patient/family.   {Blank single:19197::"Allergy testing results were read and interpreted by myself, documented by clinical staff.","Allergy testing results were read by ***,FNP, documented by  clinical staff"}  Assessment/Plan   ***  Other: {Blank multiple:19196:a:"***","samples provided of: ***","spacer provided in clinic","nebulizer machine provided in clinic","school forms provided","reviewed spirometry technique","reviewed inhaler technique","allergy injection given in clinic today","biologic given in clinic today"}  Tonny Bollman, MD  [facility]

## 2022-09-28 ENCOUNTER — Ambulatory Visit: Payer: BC Managed Care – PPO | Admitting: Internal Medicine

## 2022-09-29 ENCOUNTER — Ambulatory Visit: Payer: BC Managed Care – PPO | Admitting: Internal Medicine

## 2022-10-02 ENCOUNTER — Ambulatory Visit: Payer: BC Managed Care – PPO | Admitting: Family Medicine

## 2022-10-02 ENCOUNTER — Encounter: Payer: Self-pay | Admitting: Family Medicine
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
## 2022-10-03 NOTE — Progress Notes (Unsigned)
FOLLOW UP Date of Service/Encounter:  10/03/22  Subjective:  [name] (DOB: 10-May-1970) is a 52 y.o. female who [facility] in re-evaluation of the following: Rash-suspected chronic urticaria History obtained from: chart review and {Persons; PED relatives w/patient:19415::"patient"}.  For Review, LV was on 06/29/22  with Dr.Aishia Barkey seen for intial visit for rash-suspected chronic urticaria . See below for summary of history and diagnostics.   Therapeutic plans/changes recommended:  Started Zyrtec 10 mg twice daily, with instructions to increase to 20 mg twice daily if needed. ----------------------------------------------------- Pertinent History/Diagnostics:  Rash: Concerning for clotting urticaria based on photographs.  Some atypical features such as duration. Rash lasting weeks at a time.  No prior history of eczema.  Better when eats cooked food from home.  Tried: Benadryl cream which helped, hydrocortisone did not help. Triggers: Peanuts and unknown. Denies other systemic reactions. -06/29/2022 skin testing and intradermal's to environmental panel and most common food allergens negative Other: hypercholesterolemia, GERD, obesity, HTN, prediabetes, aortic atherosclerosis, hiatal hernia, biliary dyskinesia, OSA on CPAP --------------------------------------------------- Today presents for follow-up. ***  Chart Review: ***  All medications reviewed by clinical staff and updated in chart. No new pertinent medical or surgical history except as noted in HPI.  ROS: All others negative except as noted per HPI.   Objective:  LMP 11/05/2015 Comment: tubal ligation There is no height or weight on file to calculate BMI. Physical Exam: General Appearance:  Alert, cooperative, no distress, appears stated age  Head:  Normocephalic, without obvious abnormality, atraumatic  Eyes:  Conjunctiva clear, EOM's intact  Ears {Blank  multiple:19196:a:"***","EACs normal bilaterally","normal TMs bilaterally","ear tubes present bilaterally without exudate"}  Nose: Nares normal, {Blank multiple:19196:a:"***","hypertrophic turbinates","normal mucosa","no visible anterior polyps","septum midline"}  Throat: Lips, tongue normal; teeth and gums normal, {Blank multiple:19196:a:"***","normal posterior oropharynx","tonsils 2+","tonsils 3+","no tonsillar exudate","+ cobblestoning","surgically absent tonsils"}  Neck: Supple, symmetrical  Lungs:   {Blank multiple:19196:a:"***","clear to auscultation bilaterally","end-expiratory wheezing","wheezing throughout"}, Respirations unlabored, {Blank multiple:19196:a:"***","no coughing","intermittent dry coughing"}  Heart:  {Blank multiple:19196:a:"***","regular rate and rhythm","no murmur"}, Appears well perfused  Extremities: No edema  Skin: {Blank multiple:19196:a:"***","erythematous, dry patches scattered on ***","lichenification on ***","Skin color, texture, turgor normal","no rashes or lesions on visualized portions of skin"}  Neurologic: No gross deficits   Labs:  Lab Orders  No laboratory test(s) ordered today    Spirometry:  Tracings reviewed. Her effort: {Blank single:19197::"Good reproducible efforts.","It was hard to get consistent efforts and there is a question as to whether this reflects a maximal maneuver.","Poor effort, data can not be interpreted.","Variable effort-results affected","effort okay for first attempt at spirometry.","Results not reproducible due to ***"} FVC: ***L FEV1: ***L, ***% predicted FEV1/FVC ratio: ***% Interpretation: {Blank single:19197::"Spirometry consistent with mild obstructive disease","Spirometry consistent with moderate obstructive disease","Spirometry consistent with severe obstructive disease","Spirometry consistent with possible restrictive disease","Spirometry consistent with mixed obstructive and restrictive disease","Spirometry uninterpretable  due to technique","Spirometry consistent with normal pattern","No overt abnormalities noted given today's efforts","Nonobstructive ratio, low FEV1","Nonobstructive ratio, low FEV1, possible restriction"}.  Please see scanned spirometry results for details.  Skin Testing: {Blank single:19197::"Select foods","Environmental allergy panel","Environmental allergy panel and select foods","Food allergy panel","None","Deferred due to recent antihistamines use","deferred due to recent reaction","Pediatric Environmental Allergy Panel","Pediatric Food Panel","Select foods and environmental allergies"}. {Blank single:19197::"Adequate positive and negative controls","Inadequate positive control-testing invalid","Adequate positive and negative controls, dermatographism present, testing difficult to interpret"}. Results discussed with patient/family.   {Blank single:19197::"Allergy testing results were read and interpreted by myself, documented by clinical staff.","Allergy testing results were read by ***,FNP, documented by  clinical staff"}  Assessment/Plan   ***  Other: {Blank multiple:19196:a:"***","samples provided of: ***","spacer provided in clinic","nebulizer machine provided in clinic","school forms provided","reviewed spirometry technique","reviewed inhaler technique","allergy injection given in clinic today","biologic given in clinic today"}  Tonny Bollman, MD  [facility]

## 2022-10-04 ENCOUNTER — Ambulatory Visit (INDEPENDENT_AMBULATORY_CARE_PROVIDER_SITE_OTHER): Payer: BC Managed Care – PPO | Admitting: Internal Medicine

## 2022-10-04 ENCOUNTER — Other Ambulatory Visit: Payer: Self-pay

## 2022-10-04 ENCOUNTER — Encounter: Payer: Self-pay | Admitting: Internal Medicine

## 2022-10-04 VITALS — BP 128/70 | HR 88 | Temp 98.2°F | Resp 20 | Wt 256.8 lb

## 2022-10-04 DIAGNOSIS — L508 Other urticaria: Secondary | ICD-10-CM | POA: Diagnosis not present

## 2022-10-04 DIAGNOSIS — R490 Dysphonia: Secondary | ICD-10-CM

## 2022-10-04 DIAGNOSIS — K219 Gastro-esophageal reflux disease without esophagitis: Secondary | ICD-10-CM

## 2022-10-04 DIAGNOSIS — J4520 Mild intermittent asthma, uncomplicated: Secondary | ICD-10-CM

## 2022-10-04 DIAGNOSIS — R21 Rash and other nonspecific skin eruption: Secondary | ICD-10-CM

## 2022-10-04 MED ORDER — OMEPRAZOLE 40 MG PO CPDR: 40.0000 mg | DELAYED_RELEASE_CAPSULE | Freq: Every day | ORAL | 3 refills | Status: DC

## 2022-10-04 NOTE — Patient Instructions (Addendum)
Rash under arm pit:  - suspect hidradenitis suppurativa; refer to dermatology  Raspy voice/drainage:  - start omeprazole 40 mg daily, take 30 minutes prior to breakfast - will refer to ENT who specialize in voice issues - zyrtec as below  Intermittent  Asthma: - Rescue Inhaler: Albuterol (Proair/Ventolin) 2 puffs . Use  every 4-6 hours as needed for chest tightness, wheezing, or coughing.   Can also use 15 minutes prior to exercise if you have symptoms with activity. - Asthma is not controlled if:  - Symptoms are occurring >2 times a week OR  - >2 times a month nighttime awakenings  - You are requiring systemic steroids (prednisone/steroid injections) more than once per year  - Your require hospitalization for your asthma.  - Please call the clinic to schedule a follow up if these symptoms arise Avoid smoke exposure Stay up-to-date with your annual flu vaccines, COVID vaccines and pneumonia vaccines when indicated.   Chronic urticaria-controlled on high-dose antihistamines - allergy testing to environmental allergies on both skin and intradermal testing negative, with adequate controls - most common food allergens also negative  Therapy Plan:  - continue zyrtec (cetirizine) 10 mg twice daily - if hives remain uncontrolled, increase dose of zyrtec (cetirizine) to max dose of 20mg  (2 pills) twice daily- this is maximum dose - can increase or decrease dosing depending on symptom control to a maximum dose of 4 tablets of antihistamine daily. Wait until hives free for at least one month prior to decreasing dose.   - if hives are still uncontrolled with the above regimen, please arrange an appointment for discussion of Xolair (omalizumab)- an injectable medication for hives  Can use one of the following in place of zyrtec if desires: Claritin (loratadine) 10 mg, Xyzal (levocetirizine) 5 mg or Allegra (fexofenadine) 180 mg daily as needed  Follow up : 12 months, sooner if needed It was a  pleasure meeting you in clinic today! Thank you for allowing me to participate in your care.  Tonny Bollman, MD [facility]

## 2022-10-12 ENCOUNTER — Telehealth: Payer: Self-pay

## 2022-10-12 NOTE — Telephone Encounter (Signed)
Patient is scheduled to see Dr. Lisette Abu on 01/16/2023 @ 2:20 P.M. Shortly after she will have a voice evaluation.   She is scheduled to see Dr. Nita Sells dermatology on 11/15/2022 @ 9:30 AM. [facility] is booking into January 2025 @ this time.   Patient informed via MyChart.

## 2022-10-12 NOTE — Telephone Encounter (Signed)
-----   Message from Verlee Monte sent at 10/04/2022  5:58 PM EDT ----- I need to place 2 referrals please.  1 to dermatology for suspected hidradenitis suppurativa.  Another to ENT-specifically Dr. Rubye Oaks for voice eval.

## 2022-11-20 ENCOUNTER — Encounter: Payer: Self-pay | Admitting: Internal Medicine

## 2022-11-20 ENCOUNTER — Ambulatory Visit (INDEPENDENT_AMBULATORY_CARE_PROVIDER_SITE_OTHER): Payer: BC Managed Care – PPO | Admitting: Internal Medicine

## 2022-11-20 VITALS — BP 122/80 | HR 83 | Temp 98.1°F | Ht 69.5 in | Wt 260.0 lb

## 2022-11-20 DIAGNOSIS — E66812 Obesity, class 2: Secondary | ICD-10-CM

## 2022-11-20 DIAGNOSIS — Z0000 Encounter for general adult medical examination without abnormal findings: Secondary | ICD-10-CM | POA: Diagnosis not present

## 2022-11-20 DIAGNOSIS — E7800 Pure hypercholesterolemia, unspecified: Secondary | ICD-10-CM | POA: Diagnosis not present

## 2022-11-20 DIAGNOSIS — G720 Drug-induced myopathy: Secondary | ICD-10-CM | POA: Insufficient documentation

## 2022-11-20 DIAGNOSIS — Z23 Encounter for immunization: Secondary | ICD-10-CM | POA: Diagnosis not present

## 2022-11-20 DIAGNOSIS — I700 Atherosclerosis of aorta: Secondary | ICD-10-CM

## 2022-11-20 DIAGNOSIS — Z6837 Body mass index (BMI) 37.0-37.9, adult: Secondary | ICD-10-CM

## 2022-11-20 DIAGNOSIS — E6609 Other obesity due to excess calories: Secondary | ICD-10-CM

## 2022-11-20 DIAGNOSIS — T466X5A Adverse effect of antihyperlipidemic and antiarteriosclerotic drugs, initial encounter: Secondary | ICD-10-CM

## 2022-11-20 NOTE — Assessment & Plan Note (Addendum)
She is encouraged to strive for BMI less than 30 to decrease cardiac risk. Advised to aim for at least 150 minutes of exercise per week. She has upcoming appt with Nutritionist. We discussed also incorporating strength training into her exercise routine.

## 2022-11-20 NOTE — Assessment & Plan Note (Signed)
Seen on coronary CTA. She is no longer on statin therapy due to statin therapy.  She has been prescribed Repatha by [facility]. She is encouraged to follow a heart healthy lifestyle.

## 2022-11-20 NOTE — Assessment & Plan Note (Signed)

## 2022-11-20 NOTE — Assessment & Plan Note (Signed)
Chronic, LDL goal is less than 70.  She is not on statin therapy per her request. 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  She has been evaluated by the Lip[facility] clinic and prescribed Repatha. She has yet to start this medication.

## 2022-11-20 NOTE — Progress Notes (Signed)
I,[name], CMA,acting as a Neurosurgeon for [name], MD.,have documented all relevant documentation on the behalf of [name], MD,as directed by  [name], MD while in the presence of [name], MD.  Subjective:    Patient ID: [name] , female    DOB: 07/07/1970 , 52 y.o.   MRN: [id number]  Chief Complaint  Patient presents with   Annual Exam    HPI  Patient is here for physical. She is followed by [facility] for her pap smears. She  admits she has yet to start Repatha to address her cholesterol. States she has to get the nerve up to inject herself. She is also concerned about her weight gain.      Past Medical History:  Diagnosis Date   Abdominal pain    Allergy    Allergy-induced asthma    Angina 05/2011   NO HEART PROBLEM WENT TO ED 05/22/11 FOR CP STRESS TEST WAS NEGATIVE    Ankle fracture    Aortic atherosclerosis (HCC) 09/01/2021   Chronic back pain greater than 3 months duration 01/17/2011   injured lumbar and posterior cervical neck in fall    Constipation    DDD (degenerative disc disease), cervical    Diverticulitis    Gallbladder problem    GERD (gastroesophageal reflux disease)    H/O hiatal hernia    Hyperlipidemia    IBS (irritable bowel syndrome)    Nausea    Neuromuscular disorder (HCC)    LUMBA BACK  TROUBLE    Pre-diabetes    Rash    UNDER BIL BREASTS   Wrist fracture      Family History  Problem Relation Age of Onset   Diabetes Mother    Hyperlipidemia Mother    Cancer Mother    Sleep apnea Mother    Obesity Mother    Heart attack Father    Cancer Father        kidney and prostate   Hyperlipidemia Father    Other Father        gout and clogged arteries   Hypertension Father    Kidney disease Father    Alcoholism Father    Renal Disease Father    Hyperlipidemia Sister    Diabetes Sister    Diabetes Brother    Hyperlipidemia Brother    Coronary artery disease Brother    Heart disease  Maternal Grandmother      Current Outpatient Medications:    albuterol (VENTOLIN HFA) 108 (90 Base) MCG/ACT inhaler, Inhale 1 puff into the lungs every 6 (six) hours as needed for wheezing or shortness of breath., Disp: 18 g, Rfl: 0   butalbital-acetaminophen-caffeine (FIORICET) 50-325-40 MG tablet, One tab po q8h prn headache, Disp: 20 tablet, Rfl: 0   cetirizine (ZYRTEC ALLERGY) 10 MG tablet, Take 1 tablet (10 mg total) by mouth daily., Disp: 30 tablet, Rfl: 0   hydrOXYzine (ATARAX) 25 MG tablet, ONE TAB PO BID PRN, Disp: 30 tablet, Rfl: 0   omeprazole (PRILOSEC) 40 MG capsule, Take 1 capsule (40 mg total) by mouth daily. Take 30 minutes prior to breakfast., Disp: 30 capsule, Rfl: 3   ketoconazole (NIZORAL) 2 % cream, Apply 1 Application topically daily. (Patient not taking: Reported on 11/20/2022), Disp: 60 g, Rfl: 0   nystatin powder, Apply 1 Application topically 3 (three) times daily. (Patient not taking: Reported on 11/20/2022), Disp: 45 g, Rfl: 1   triamcinolone cream (KENALOG) 0.1 %, APPLY TO  AFFECTED AREA TWICE DAILY AS NEEDED (Patient not taking: Reported on 11/20/2022), Disp: 45 g, Rfl: 0   Allergies  Allergen Reactions   Amoxicillin Hives   Sulfa Antibiotics Hives   Penicillins Hives and Itching    Has patient had a PCN reaction causing immediate rash, facial/tongue/throat swelling, SOB or lightheadedness with hypotension: Yes Has patient had a PCN reaction causing severe rash involving mucus membranes or skin necrosis: No Has patient had a PCN reaction that required hospitalization No Has patient had a PCN reaction occurring within the last 10 years: Yes If all of the above answers are "NO", then may proceed with Cephalosporin use.    Tape     PLASTIC TAPE   (WHELPS, TORE SKIN)   Tramadol Hives      The patient states she uses post menopausal status for birth control. Patient's last menstrual period was 11/05/2015.. Negative for Dysmenorrhea. Negative for: breast discharge,  breast lump(s), breast pain and breast self exam. Associated symptoms include abnormal vaginal bleeding. Pertinent negatives include abnormal bleeding (hematology), anxiety, decreased libido, depression, difficulty falling sleep, dyspareunia, history of infertility, nocturia, sexual dysfunction, sleep disturbances, urinary incontinence, urinary urgency, vaginal discharge and vaginal itching. Diet regular.The patient states her exercise level is  intermittent.  . The patient's tobacco use is:  Social History   Tobacco Use  Smoking Status Never  Smokeless Tobacco Never  . She has been exposed to passive smoke. The patient's alcohol use is:  Social History   Substance and Sexual Activity  Alcohol Use Yes   Comment: ocass    Review of Systems  Constitutional:  Positive for unexpected weight change.  HENT: Negative.    Eyes: Negative.   Respiratory: Negative.    Cardiovascular: Negative.   Gastrointestinal: Negative.   Endocrine: Negative.   Genitourinary: Negative.   Musculoskeletal: Negative.   Skin: Negative.   Allergic/Immunologic: Negative.   Neurological: Negative.   Hematological: Negative.   Psychiatric/Behavioral: Negative.       Today's Vitals   11/20/22 0902  BP: 122/80  Pulse: 83  Temp: 98.1 F (36.7 C)  Weight: 260 lb (117.9 kg)  Height: 5' 9.5" (1.765 m)  PainSc: 0-No pain   Body mass index is 37.84 kg/m.  Wt Readings from Last 3 Encounters:  11/20/22 260 lb (117.9 kg)  10/04/22 256 lb 12.8 oz (116.5 kg)  06/29/22 249 lb 11.2 oz (113.3 kg)     Objective:  Physical Exam Vitals and nursing note reviewed.  Constitutional:      Appearance: Normal appearance.  HENT:     Head: Normocephalic and atraumatic.     Right Ear: Tympanic membrane, ear canal and external ear normal.     Left Ear: Tympanic membrane, ear canal and external ear normal.     Nose: Nose normal.     Mouth/Throat:     Mouth: Mucous membranes are moist.     Pharynx: Oropharynx is clear.   Eyes:     Extraocular Movements: Extraocular movements intact.     Conjunctiva/sclera: Conjunctivae normal.     Pupils: Pupils are equal, round, and reactive to light.  Cardiovascular:     Rate and Rhythm: Normal rate and regular rhythm.     Pulses: Normal pulses.     Heart sounds: Normal heart sounds.  Pulmonary:     Effort: Pulmonary effort is normal.     Breath sounds: Normal breath sounds.  Abdominal:     General: Abdomen is flat. Bowel sounds are normal.  Palpations: Abdomen is soft.  Genitourinary:    Comments: deferred Musculoskeletal:        General: Normal range of motion.     Cervical back: Normal range of motion and neck supple.  Skin:    General: Skin is warm and dry.  Neurological:     General: No focal deficit present.     Mental Status: She is alert and oriented to person, place, and time.  Psychiatric:        Mood and Affect: Mood normal.        Behavior: Behavior normal.         Assessment And Plan:     Encounter for general adult medical examination w/o abnormal findings Assessment & Plan: A full exam was performed.  Importance of monthly self breast exams was discussed with the patient.  She is advised to get 30-45 minutes of regular exercise, no less than four to five days per week. Both weight-bearing and aerobic exercises are recommended.  She is advised to follow a healthy diet with at least six fruits/veggies per day, decrease intake of red meat and other saturated fats and to increase fish intake to twice weekly.  Meats/fish should not be fried -- baked, boiled or broiled is preferable. It is also important to cut back on your sugar intake.  Be sure to read labels - try to avoid anything with added sugar, high fructose corn syrup or other sweeteners.  If you must use a sweetener, you can try stevia or monkfruit.  It is also important to avoid artificially sweetened foods/beverages and diet drinks. Lastly, wear SPF 50 sunscreen on exposed skin and when  in direct sunlight for an extended period of time.  Be sure to avoid fast food restaurants and aim for at least 60 ounces of water daily.      Orders: -     CBC -     Hemoglobin A1c -     Lipid panel -     CMP14+EGFR -     TSH  Aortic atherosclerosis (HCC) Assessment & Plan: Seen on coronary CTA. She is no longer on statin therapy due to statin therapy.  She has been prescribed Repatha by [facility]. She is encouraged to follow a heart healthy lifestyle.    Pure hypercholesterolemia Assessment & Plan: Chronic, LDL goal is less than 70.  She is not on statin therapy per her request. 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  She has been evaluated by the Lipid clinic and prescribed Repatha. She has yet to start this medication.    Statin myopathy  Class 2 obesity due to excess calories without serious comorbidity with body mass index (BMI) of 37.0 to 37.9 in adult Assessment & Plan: She is encouraged to strive for BMI less than 30 to decrease cardiac risk. Advised to aim for at least 150 minutes of exercise per week. She has upcoming appt with Nutritionist. We discussed also incorporating strength training into her exercise routine.    Immunization due -     Flu vaccine trivalent PF, 6mos and older(Flulaval,Afluria,Fluarix,Fluzone)     Return for 1 year physical, 6 month bp. Patient was given opportunity to ask questions. Patient verbalized understanding of the plan and was able to repeat key elements of the plan. All questions were answered to their satisfaction.   I, [name], MD, have reviewed all documentation for this visit. The documentation on 11/20/22 for the exam, diagnosis, procedures, and orders are all accurate and  complete.

## 2022-11-21 LAB — HEMOGLOBIN A1C
Est. average glucose Bld gHb Est-mCnc: 134 mg/dL
Hgb A1c MFr Bld: 6.3 % — ABNORMAL HIGH (ref 4.8–5.6)

## 2022-11-21 LAB — CMP14+EGFR
ALT: 15 [IU]/L (ref 0–32)
AST: 23 [IU]/L (ref 0–40)
Albumin: 4.2 g/dL (ref 3.8–4.9)
Alkaline Phosphatase: 69 [IU]/L (ref 44–121)
BUN/Creatinine Ratio: 10 (ref 9–23)
BUN: 7 mg/dL (ref 6–24)
Bilirubin Total: 0.3 mg/dL (ref 0.0–1.2)
CO2: 25 mmol/L (ref 20–29)
Calcium: 9.5 mg/dL (ref 8.7–10.2)
Chloride: 104 mmol/L (ref 96–106)
Creatinine, Ser: 0.72 mg/dL (ref 0.57–1.00)
Globulin, Total: 2.7 g/dL (ref 1.5–4.5)
Glucose: 94 mg/dL (ref 70–99)
Potassium: 4.4 mmol/L (ref 3.5–5.2)
Sodium: 143 mmol/L (ref 134–144)
Total Protein: 6.9 g/dL (ref 6.0–8.5)
eGFR: 100 mL/min/{1.73_m2} (ref >59)

## 2022-11-21 LAB — CBC
Hematocrit: 42.7 % (ref 34.0–46.6)
Hemoglobin: 13.6 g/dL (ref 11.1–15.9)
MCH: 26.5 pg — ABNORMAL LOW (ref 26.6–33.0)
MCHC: 31.9 g/dL (ref 31.5–35.7)
MCV: 83 fL (ref 79–97)
Platelets: 213 10*3/uL (ref 150–450)
RBC: 5.14 x10E6/uL (ref 3.77–5.28)
RDW: 12.6 % (ref 11.7–15.4)
WBC: 3.4 10*3/uL (ref 3.4–10.8)

## 2022-11-21 LAB — LIPID PANEL
Chol/HDL Ratio: 4.7 {ratio} — ABNORMAL HIGH (ref 0.0–4.4)
Cholesterol, Total: 232 mg/dL — ABNORMAL HIGH (ref 100–199)
HDL: 49 mg/dL (ref >39)
LDL Chol Calc (NIH): 166 mg/dL — ABNORMAL HIGH (ref 0–99)
Triglycerides: 96 mg/dL (ref 0–149)
VLDL Cholesterol Cal: 17 mg/dL (ref 5–40)

## 2022-11-21 LAB — TSH: TSH: 1.48 u[IU]/mL (ref 0.450–4.500)

## 2023-01-04 ENCOUNTER — Ambulatory Visit
Admission: EM | Admit: 2023-01-04 | Discharge: 2023-01-04 | Disposition: A | Discharge status: Home / Self Care | Payer: BC Managed Care – PPO | Attending: Internal Medicine | Admitting: Internal Medicine

## 2023-01-04 ENCOUNTER — Encounter: Payer: Self-pay | Admitting: Emergency Medicine

## 2023-01-04 DIAGNOSIS — J309 Allergic rhinitis, unspecified: Secondary | ICD-10-CM | POA: Diagnosis not present

## 2023-01-04 DIAGNOSIS — G43809 Other migraine, not intractable, without status migrainosus: Secondary | ICD-10-CM

## 2023-01-04 DIAGNOSIS — J018 Other acute sinusitis: Secondary | ICD-10-CM | POA: Diagnosis not present

## 2023-01-04 MED ORDER — DEXAMETHASONE SODIUM PHOSPHATE 10 MG/ML IJ SOLN
10.0000 mg | Freq: Once | INTRAMUSCULAR | Status: AC
  Administered 2023-01-04: 10 mg via INTRAMUSCULAR

## 2023-01-04 MED ORDER — DOXYCYCLINE HYCLATE 100 MG PO CAPS: 100.0000 mg | ORAL_CAPSULE | Freq: Two times a day (BID) | ORAL | 0 refills | Status: DC

## 2023-01-04 NOTE — Discharge Instructions (Signed)
We will manage this as a sinus infection with doxycycline. For sore throat or cough try using a honey-based tea. Use 3 teaspoons of honey with juice squeezed from half lemon. Place shaved pieces of ginger into 1/2-1 cup of water and warm over stove top. Then mix the ingredients and repeat every 4 hours as needed. Please take ibuprofen 600mg  every 6 hours with food alternating with OR taken together with Tylenol 500mg -650mg  every 6 hours for throat pain, fevers, aches and pains. Hydrate very well with at least 2 liters of water. Eat light meals such as soups (chicken and noodles, vegetable, chicken and wild rice).  Do not eat foods that you are allergic to.  Taking an antihistamine like Zyrtec can help against postnasal drainage, sinus congestion which can cause sinus pain, sinus headaches, throat pain, painful swallowing, coughing.  You can take this together with pseudoephedrine (Sudafed) at a dose of 60 mg 3 times a day or twice daily as needed for the same kind of nasal drip, congestion.  Use cough medication as needed.

## 2023-01-04 NOTE — ED Provider Notes (Signed)
[facility]  Note:  This document was prepared using Conservation officer, historic buildings and may include unintentional dictation errors.  MRN: [id number] DOB: 1970/09/16  Subjective:   PA COLMENERO is a 52 y.o. female presenting for 5-day history of acute onset persistent headaches, frontal headaches, pressure about both of her ears.  Has a history of migraines when she was younger but this headache is different in nature.  She did use Fioricet with some temporary relief last night.  No confusion, photophobia, phonophobia, numbness or tingling, weakness, shortness of breath, nausea, vomiting, abdominal pain.  Has a history of allergic rhinitis, asthma.  No smoking of any kind including cigarettes, cigars, vaping, marijuana use.    No current facility-administered medications for this encounter.  Current Outpatient Medications:    albuterol (VENTOLIN HFA) 108 (90 Base) MCG/ACT inhaler, Inhale 1 puff into the lungs every 6 (six) hours as needed for wheezing or shortness of breath., Disp: 18 g, Rfl: 0   butalbital-acetaminophen-caffeine (FIORICET) 50-325-40 MG tablet, One tab po q8h prn headache, Disp: 20 tablet, Rfl: 0   cetirizine (ZYRTEC ALLERGY) 10 MG tablet, Take 1 tablet (10 mg total) by mouth daily., Disp: 30 tablet, Rfl: 0   omeprazole (PRILOSEC) 40 MG capsule, Take 1 capsule (40 mg total) by mouth daily. Take 30 minutes prior to breakfast., Disp: 30 capsule, Rfl: 3   hydrOXYzine (ATARAX) 25 MG tablet, ONE TAB PO BID PRN, Disp: 30 tablet, Rfl: 0   ketoconazole (NIZORAL) 2 % cream, Apply 1 Application topically daily. (Patient not taking: Reported on 11/20/2022), Disp: 60 g, Rfl: 0   nystatin powder, Apply 1 Application topically 3 (three) times daily. (Patient not taking: Reported on 11/20/2022), Disp: 45 g, Rfl: 1   triamcinolone cream (KENALOG) 0.1 %, APPLY TO AFFECTED AREA TWICE DAILY AS NEEDED (Patient not taking: Reported on 11/20/2022), Disp: 45 g, Rfl: 0    Allergies  Allergen Reactions   Amoxicillin Hives   Sulfa Antibiotics Hives   Penicillins Hives and Itching    Has patient had a PCN reaction causing immediate rash, facial/tongue/throat swelling, SOB or lightheadedness with hypotension: Yes Has patient had a PCN reaction causing severe rash involving mucus membranes or skin necrosis: No Has patient had a PCN reaction that required hospitalization No Has patient had a PCN reaction occurring within the last 10 years: Yes If all of the above answers are "NO", then may proceed with Cephalosporin use.    Tape     PLASTIC TAPE   (WHELPS, TORE SKIN)   Tramadol Hives    Past Medical History:  Diagnosis Date   Abdominal pain    Allergy    Allergy-induced asthma    Angina 05/2011   NO HEART PROBLEM WENT TO ED 05/22/11 FOR CP STRESS TEST WAS NEGATIVE    Ankle fracture    Aortic atherosclerosis (HCC) 09/01/2021   Chronic back pain greater than 3 months duration 01/17/2011   injured lumbar and posterior cervical neck in fall    Constipation    DDD (degenerative disc disease), cervical    Diverticulitis    Gallbladder problem    GERD (gastroesophageal reflux disease)    H/O hiatal hernia    Hyperlipidemia    IBS (irritable bowel syndrome)    Nausea    Neuromuscular disorder (HCC)    LUMBA BACK  TROUBLE    Pre-diabetes    Rash    UNDER BIL BREASTS   Wrist fracture  Past Surgical History:  Procedure Laterality Date   BREAST CYST EXCISION Left ~ 1983   CHOLECYSTECTOMY  08/2010   COLONOSCOPY  07/2010   Removal of 3 polyps   DILATATION & CURETTAGE/HYSTEROSCOPY WITH MYOSURE N/A 09/03/2015   Procedure: DILATATION & CURETTAGE/HYSTEROSCOPY WITH MYOSURE;  Surgeon: Maxie Better, MD;  Location: [facility];  Service: Gynecology;  Laterality: N/A;   FRACTURE SURGERY  06/12/11   patient denies   HERNIA REPAIR  1997; 05/2011   umbilical corrected during tubal ligation; VHR w/mesh   TUBAL LIGATION  12/1995   VENTRAL HERNIA REPAIR   06/12/2011   Procedure: LAPAROSCOPIC VENTRAL HERNIA;  Surgeon: Cherylynn Ridges, MD;  Location: [facility];  Service: General;  Laterality: N/A;   WISDOM TOOTH EXTRACTION  1990's    Family History  Problem Relation Age of Onset   Diabetes Mother    Hyperlipidemia Mother    Cancer Mother    Sleep apnea Mother    Obesity Mother    Heart attack Father    Cancer Father        kidney and prostate   Hyperlipidemia Father    Other Father        gout and clogged arteries   Hypertension Father    Kidney disease Father    Alcoholism Father    Renal Disease Father    Hyperlipidemia Sister    Diabetes Sister    Diabetes Brother    Hyperlipidemia Brother    Coronary artery disease Brother    Heart disease Maternal Grandmother     Social History   Tobacco Use   Smoking status: Never   Smokeless tobacco: Never  Vaping Use   Vaping status: Never Used  Substance Use Topics   Alcohol use: Yes    Comment: ocass   Drug use: No    ROS   Objective:   Vitals: BP (!) 140/84 (BP Location: Right Arm)   Pulse 77   Temp 97.7 F (36.5 C) (Oral)   Resp 16   LMP 11/05/2015 Comment: tubal ligation  SpO2 95%   Physical Exam Constitutional:      General: She is not in acute distress.    Appearance: Normal appearance. She is well-developed and normal weight. She is not ill-appearing, toxic-appearing or diaphoretic.  HENT:     Head: Normocephalic and atraumatic.     Right Ear: Tympanic membrane, ear canal and external ear normal. No drainage or tenderness. No middle ear effusion. There is no impacted cerumen. Tympanic membrane is not erythematous or bulging.     Left Ear: Tympanic membrane, ear canal and external ear normal. No drainage or tenderness.  No middle ear effusion. There is no impacted cerumen. Tympanic membrane is not erythematous or bulging.     Nose: Congestion present. No rhinorrhea.     Mouth/Throat:     Mouth: Mucous membranes are moist. No oral lesions.     Pharynx: No  pharyngeal swelling, oropharyngeal exudate, posterior oropharyngeal erythema or uvula swelling.     Tonsils: No tonsillar exudate or tonsillar abscesses.  Eyes:     General: Lids are normal. Lids are everted, no foreign bodies appreciated. Vision grossly intact. No scleral icterus.       Right eye: No foreign body, discharge or hordeolum.        Left eye: No foreign body, discharge or hordeolum.     Extraocular Movements: Extraocular movements intact.     Right eye: Normal extraocular motion.     Left eye:  Normal extraocular motion and no nystagmus.     Conjunctiva/sclera: Conjunctivae normal.     Right eye: Right conjunctiva is not injected. No chemosis, exudate or hemorrhage.    Left eye: Left conjunctiva is not injected. No chemosis, exudate or hemorrhage.    Pupils: Pupils are equal, round, and reactive to light.  Neck:     Meningeal: Brudzinski's sign and Kernig's sign absent.  Cardiovascular:     Rate and Rhythm: Normal rate and regular rhythm.     Heart sounds: Normal heart sounds. No murmur heard.    No friction rub. No gallop.  Pulmonary:     Effort: Pulmonary effort is normal. No respiratory distress.     Breath sounds: No stridor. No wheezing, rhonchi or rales.  Chest:     Chest wall: No tenderness.  Musculoskeletal:     Cervical back: Normal range of motion and neck supple.  Lymphadenopathy:     Cervical: No cervical adenopathy.  Skin:    General: Skin is warm and dry.  Neurological:     General: No focal deficit present.     Mental Status: She is alert and oriented to person, place, and time.     Cranial Nerves: No cranial nerve deficit, dysarthria or facial asymmetry.     Motor: No weakness or pronator drift.     Coordination: Romberg sign negative. Coordination normal. Finger-Nose-Finger Test and Heel to [facility] Test normal. Rapid alternating movements normal.     Gait: Gait and tandem walk normal.     Deep Tendon Reflexes: Reflexes normal.  Psychiatric:        Mood  and Affect: Mood normal.        Behavior: Behavior normal.        Thought Content: Thought content normal.        Judgment: Judgment normal.    IM dexamethasone 10 mg administered in clinic.  Assessment and Plan :   PDMP not reviewed this encounter.  1. Other acute sinusitis, recurrence not specified   2. Allergic rhinitis, unspecified seasonality, unspecified trigger   3. Other migraine without status migrainosus, not intractable    Suspect primary issues sinusitis and recommended doxycycline for this.  IM steroid as above for allergic rhinitis and atypical migraine.  Continue migraine medications at home.  Use supportive care otherwise.  Low suspicion for an acute encephalopathy.  Counseled patient on potential for adverse effects with medications prescribed/recommended today, ER and return-to-clinic precautions discussed, patient verbalized understanding.    Wallis Bamberg, [place] 01/04/23 [phone]

## 2023-01-04 NOTE — ED Triage Notes (Signed)
Headache x 5 days. Head movement, turning, coughing all worsens. Reports hx of migraines when she was younger, but states this feels different. Points to area between eyebrows extending back along the midline of her head, also reports pressure in ears bilaterally. States the pain has been constant over the last five days, eases with occasional tylenol use. Fioricet helped her to go to sleep last night. Denies any recent head trauma, sensitivity to light and sound. Does report occasional dizziness and nausea since onset of symptoms.

## 2023-01-11 ENCOUNTER — Ambulatory Visit: Payer: BC Managed Care – PPO | Admitting: Internal Medicine

## 2023-01-11 ENCOUNTER — Encounter: Payer: Self-pay | Admitting: Internal Medicine

## 2023-01-11 VITALS — BP 120/82 | HR 76 | Temp 97.8°F | Ht 69.0 in | Wt 264.4 lb

## 2023-01-11 DIAGNOSIS — E7800 Pure hypercholesterolemia, unspecified: Secondary | ICD-10-CM

## 2023-01-11 DIAGNOSIS — Z566 Other physical and mental strain related to work: Secondary | ICD-10-CM | POA: Diagnosis not present

## 2023-01-11 DIAGNOSIS — J0110 Acute frontal sinusitis, unspecified: Secondary | ICD-10-CM | POA: Insufficient documentation

## 2023-01-11 DIAGNOSIS — G44209 Tension-type headache, unspecified, not intractable: Secondary | ICD-10-CM | POA: Diagnosis not present

## 2023-01-11 MED ORDER — CYCLOBENZAPRINE HCL 5 MG PO TABS: 5.0000 mg | ORAL_TABLET | Freq: Three times a day (TID) | ORAL | 0 refills | Status: AC | PRN

## 2023-01-11 NOTE — Progress Notes (Signed)
I,Victoria T Deloria Lair, CMA,acting as a Neurosurgeon for [name], MD.,have documented all relevant documentation on the behalf of [name], MD,as directed by  [name], MD while in the presence of [name], MD.  Subjective:  Patient ID: [name] , female    DOB: May 15, 1970 , 52 y.o.   MRN: [id number]  Chief Complaint  Patient presents with   Headache    HPI  Patient presents today for evaluation of a headache. She was seen at Urgent Care and diagnosed with sinus infection. She is concerned because her sx have persisted. She was unsure of diagnosis because she never had fever, chills, cough or congestion.  Denies dizziness, chest pain & sob. She has had no relief despite compliance with antibiotics.  She admits she is under a lot of stress. There have been a lot of changes at work.     Headache  This is a recurrent problem. The current episode started 1 to 4 weeks ago. The problem occurs intermittently. The problem has been gradually improving. The pain is located in the Bilateral, frontal and occipital region. The pain does not radiate. The quality of the pain is described as aching and dull. The pain is at a severity of 6/10. The pain is moderate. Pertinent negatives include no facial sweating, hearing loss or scalp tenderness.     Past Medical History:  Diagnosis Date   Abdominal pain    Allergy    Allergy-induced asthma    Angina 05/2011   NO HEART PROBLEM WENT TO ED 05/22/11 FOR CP STRESS TEST WAS NEGATIVE    Ankle fracture    Aortic atherosclerosis (HCC) 09/01/2021   Chronic back pain greater than 3 months duration 01/17/2011   injured lumbar and posterior cervical neck in fall    Constipation    DDD (degenerative disc disease), cervical    Diverticulitis    Gallbladder problem    GERD (gastroesophageal reflux disease)    H/O hiatal hernia    Hyperlipidemia    IBS (irritable bowel syndrome)    Nausea    Neuromuscular disorder (HCC)    LUMBA  BACK  TROUBLE    Pre-diabetes    Rash    UNDER BIL BREASTS   Wrist fracture      Family History  Problem Relation Age of Onset   Diabetes Mother    Hyperlipidemia Mother    Cancer Mother    Sleep apnea Mother    Obesity Mother    Heart attack Father    Cancer Father        kidney and prostate   Hyperlipidemia Father    Other Father        gout and clogged arteries   Hypertension Father    Kidney disease Father    Alcoholism Father    Renal Disease Father    Hyperlipidemia Sister    Diabetes Sister    Diabetes Brother    Hyperlipidemia Brother    Coronary artery disease Brother    Heart disease Maternal Grandmother      Current Outpatient Medications:    albuterol (VENTOLIN HFA) 108 (90 Base) MCG/ACT inhaler, Inhale 1 puff into the lungs every 6 (six) hours as needed for wheezing or shortness of breath., Disp: 18 g, Rfl: 0   butalbital-acetaminophen-caffeine (FIORICET) 50-325-40 MG tablet, One tab po q8h prn headache, Disp: 20 tablet, Rfl: 0   cetirizine (ZYRTEC ALLERGY) 10 MG tablet, Take 1 tablet (10 mg total) by mouth  daily., Disp: 30 tablet, Rfl: 0   cyclobenzaprine (FLEXERIL) 5 MG tablet, Take 1 tablet (5 mg total) by mouth 3 (three) times daily as needed for muscle spasms., Disp: 30 tablet, Rfl: 0   doxycycline (VIBRAMYCIN) 100 MG capsule, Take 1 capsule (100 mg total) by mouth 2 (two) times daily., Disp: 20 capsule, Rfl: 0   hydrOXYzine (ATARAX) 25 MG tablet, ONE TAB PO BID PRN, Disp: 30 tablet, Rfl: 0   omeprazole (PRILOSEC) 40 MG capsule, Take 1 capsule (40 mg total) by mouth daily. Take 30 minutes prior to breakfast., Disp: 30 capsule, Rfl: 3   ketoconazole (NIZORAL) 2 % cream, Apply 1 Application topically daily. (Patient not taking: Reported on 11/20/2022), Disp: 60 g, Rfl: 0   nystatin powder, Apply 1 Application topically 3 (three) times daily. (Patient not taking: Reported on 11/20/2022), Disp: 45 g, Rfl: 1   triamcinolone cream (KENALOG) 0.1 %, APPLY TO  AFFECTED AREA TWICE DAILY AS NEEDED (Patient not taking: Reported on 11/20/2022), Disp: 45 g, Rfl: 0   Allergies  Allergen Reactions   Amoxicillin Hives   Sulfa Antibiotics Hives   Penicillins Hives and Itching    Has patient had a PCN reaction causing immediate rash, facial/tongue/throat swelling, SOB or lightheadedness with hypotension: Yes Has patient had a PCN reaction causing severe rash involving mucus membranes or skin necrosis: No Has patient had a PCN reaction that required hospitalization No Has patient had a PCN reaction occurring within the last 10 years: Yes If all of the above answers are "NO", then may proceed with Cephalosporin use.    Tape     PLASTIC TAPE   (WHELPS, TORE SKIN)   Tramadol Hives     Review of Systems  Constitutional: Negative.   HENT:  Negative for hearing loss.   Respiratory: Negative.    Cardiovascular: Negative.   Gastrointestinal: Negative.   Neurological:  Positive for headaches.  Psychiatric/Behavioral: Negative.       Today's Vitals   01/11/23 0828  BP: 120/82  Pulse: 76  Temp: 97.8 F (36.6 C)  SpO2: 98%  Weight: 264 lb 6.4 oz (119.9 kg)  Height: 5\' 9"  (1.753 m)   Body mass index is 39.05 kg/m.  Wt Readings from Last 3 Encounters:  01/11/23 264 lb 6.4 oz (119.9 kg)  11/20/22 260 lb (117.9 kg)  10/04/22 256 lb 12.8 oz (116.5 kg)     Objective:  Physical Exam Vitals and nursing note reviewed.  Constitutional:      Appearance: Normal appearance. She is well-developed. She is obese.  HENT:     Head: Normocephalic and atraumatic.  Eyes:     Pupils: Pupils are equal, round, and reactive to light.  Cardiovascular:     Rate and Rhythm: Normal rate and regular rhythm.     Heart sounds: Normal heart sounds.  Pulmonary:     Effort: Pulmonary effort is normal.     Breath sounds: Normal breath sounds.  Musculoskeletal:     Comments: Trapezius tenderness to deep palpation  Skin:    General: Skin is warm.  Neurological:      General: No focal deficit present.     Mental Status: She is alert.  Psychiatric:        Mood and Affect: Mood normal.        Behavior: Behavior normal.         Assessment And Plan:  Tension headache Assessment & Plan: She was given rx cyclobenzaprine nightly prn. She will let me know  if her sx persist. She would also benefit from therapeutic massages prn.    Acute non-recurrent frontal sinusitis Assessment & Plan: Urgent care notes reviewed. She is encouraged to complete full antibiotic course.  I think this is resolving and headaches are unrelated to this infection.    Pure hypercholesterolemia Assessment & Plan: Chronic, will check labs as below. Pt advised if lipoprotein(a) levels are elevated, it is imperative that she comply with statin therapy and to adopt a heart healthy lifestyle. She is already followed by Cardiology.   Orders: -     Lipoprotein A (LPA)  Work stress Assessment & Plan: She agrees to referral for therapy. Encouraged to take time out for self care.   Orders: -     Ambulatory referral to Psychology  Other orders -     Cyclobenzaprine HCl; Take 1 tablet (5 mg total) by mouth 3 (three) times daily as needed for muscle spasms.  Dispense: 30 tablet; Refill: 0  She is encouraged to strive for BMI less than 30 to decrease cardiac risk. Advised to aim for at least 150 minutes of exercise per week.    Return if symptoms worsen or fail to improve.  Patient was given opportunity to ask questions. Patient verbalized understanding of the plan and was able to repeat key elements of the plan. All questions were answered to their satisfaction.    I, [name], MD, have reviewed all documentation for this visit. The documentation on 01/11/23 for the exam, diagnosis, procedures, and orders are all accurate and complete.   IF YOU HAVE BEEN REFERRED TO A SPECIALIST, IT MAY TAKE 1-2 WEEKS TO SCHEDULE/PROCESS THE REFERRAL. IF YOU HAVE NOT HEARD FROM US/SPECIALIST  IN TWO WEEKS, PLEASE GIVE [place] A CALL AT [phone].   THE PATIENT IS ENCOURAGED TO PRACTICE SOCIAL DISTANCING DUE TO THE COVID-19 PANDEMIC.

## 2023-01-11 NOTE — Patient Instructions (Signed)
Tension Headache, Adult A tension headache is a feeling of pain, pressure, or aching in the head. It is often felt over the front and sides of the head. Tension headaches can last from 30 minutes to several days. What are the causes? The cause of this condition is not known. Sometimes, tension headaches are brought on by stress, worry (anxiety), or depression. Other things that may set them off include: Alcohol. Too much caffeine or caffeine withdrawal. Colds, flu, or sinus infections. Dental problems. This can include clenching your teeth. Being tired. Holding your head and neck in the same position for a long time, such as while using a computer. Smoking. Arthritis in the neck. What are the signs or symptoms? Feeling pressure around the head. A dull ache in the head. Pain over the front and sides of the head. Feeling sore or tender in the muscles of the head, neck, and shoulders. How is this treated? This condition may be treated with lifestyle changes and with medicines that help relieve symptoms. Follow these instructions at home: Managing pain Take over-the-counter and prescription medicines only as told by your doctor. When you have a headache, lie down in a dark, quiet room. If told, put ice on your head and neck. To do this: Put ice in a plastic bag. Place a towel between your skin and the bag. Leave the ice on for 20 minutes, 2-3 times a day. Take off the ice if your skin turns bright red. This is very important. If you cannot feel pain, heat, or cold, you have a greater risk of damage to the area. If told, put heat on the back of your neck. Do this as often as told by your doctor. Use the heat source that your doctor recommends, such as a moist heat pack or a heating pad. Place a towel between your skin and the heat source. Leave the heat on for 20-30 minutes. Take off the heat if your skin turns bright red. This is very important. If you cannot feel pain, heat, or cold, you  have a greater risk of getting burned. Eating and drinking Eat meals on a regular schedule. If you drink alcohol: Limit how much you have to: 0-1 drink a day for women who are not pregnant. 0-2 drinks a day for men. Know how much alcohol is in your drink. In the [place] one drink equals one 12 oz bottle of beer (355 mL), one 5 oz glass of wine (148 mL), or one 1 oz glass of hard liquor (44 mL). Drink enough fluid to keep your pee (urine) pale yellow. Do not use a lot of caffeine, or stop using caffeine. Lifestyle Get 7-9 hours of sleep each night. Or get the amount of sleep that your doctor tells you to. At bedtime, keep computers, phones, and tablets out of your room. Find ways to lessen your stress. This may include: Exercise. Deep breathing. Yoga. Listening to music. Thinking positive thoughts. Sit up straight. Try to relax your muscles. Do not smoke or use any products that contain nicotine or tobacco. If you need help quitting, ask your doctor. General instructions  Avoid things that can bring on headaches. Keep a headache journal to see what may bring on headaches. For example, write down: What you eat and drink. How much sleep you get. Any change to your diet or medicines. Keep all follow-up visits. Contact a doctor if: Your headache does not get better. Your headache comes back. You have a headache, and sounds,  light, or smells bother you. You feel like you may vomit, or you vomit. Your stomach hurts. Get help right away if: You all of a sudden get a very bad headache with any of these things: A stiff neck. Feeling like you may vomit. Vomiting. Feeling mixed up (confused). Feeling weak in one part or one side of your body. Having trouble seeing or speaking, or both. Feeling short of breath. A rash. Feeling very sleepy. Pain in your eye or ear. Trouble walking or balancing. Feeling like you will faint, or you faint. Summary A tension headache is pain, pressure,  or aching in your head. Tension headaches can last from 30 minutes to several days. Lifestyle changes and medicines may help relieve pain. This information is not intended to replace advice given to you by your health care provider. Make sure you discuss any questions you have with your health care provider. Document Revised: 11/06/2019 Document Reviewed: 11/06/2019 Elsevier Patient Education  2024 [employer].

## 2023-01-13 LAB — LIPOPROTEIN A (LPA): Lipoprotein (a): 403.7 nmol/L — ABNORMAL HIGH (ref <75.0)

## 2023-01-15 NOTE — Assessment & Plan Note (Signed)
She agrees to referral for therapy. Encouraged to take time out for self care.

## 2023-01-15 NOTE — Assessment & Plan Note (Signed)
Chronic, will check labs as below. Pt advised if lipoprotein(a) levels are elevated, it is imperative that she comply with statin therapy and to adopt a heart healthy lifestyle. She is already followed by Cardiology.

## 2023-01-15 NOTE — Assessment & Plan Note (Signed)
Urgent care notes reviewed. She is encouraged to complete full antibiotic course.  I think this is resolving and headaches are unrelated to this infection.

## 2023-01-15 NOTE — Assessment & Plan Note (Signed)
She was given rx cyclobenzaprine nightly prn. She will let me know if her sx persist. She would also benefit from therapeutic massages prn.

## 2023-02-01 ENCOUNTER — Telehealth: Payer: Self-pay | Admitting: Neurology

## 2023-02-01 NOTE — Telephone Encounter (Signed)
Pt called to r/s missed appt. Scheduled and wait listed
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
## 2023-03-26 NOTE — Patient Instructions (Addendum)
 Please continue using your CPAP regularly. While your insurance requires that you use CPAP at least 4 hours each night on 70% of the nights, I recommend, that you not skip any nights and use it throughout the night if you can. Getting used to CPAP and staying with the treatment long term does take time and patience and discipline. Untreated obstructive sleep apnea when it is moderate to severe can have an adverse impact on cardiovascular health and raise her risk for heart disease, arrhythmias, hypertension, congestive heart failure, stroke and diabetes. Untreated obstructive sleep apnea causes sleep disruption, nonrestorative sleep, and sleep deprivation. This can have an impact on your day to day functioning and cause daytime sleepiness and impairment of cognitive function, memory loss, mood disturbance, and problems focussing. Using CPAP regularly can improve these symptoms.  We will update supply orders, today. Try adjusting humidity at home for dry mouth. Consider Biotene products   Follow up in 1 year

## 2023-03-28 NOTE — Progress Notes (Signed)
 [name]

## 2023-03-29 ENCOUNTER — Ambulatory Visit: Payer: BC Managed Care – PPO | Admitting: Family Medicine

## 2023-03-29 ENCOUNTER — Encounter: Payer: Self-pay | Admitting: Family Medicine

## 2023-03-29 VITALS — BP 140/89 | HR 78 | Ht 68.0 in | Wt 268.0 lb

## 2023-03-29 DIAGNOSIS — G4733 Obstructive sleep apnea (adult) (pediatric): Secondary | ICD-10-CM | POA: Diagnosis not present

## 2023-04-16 ENCOUNTER — Encounter: Payer: Self-pay | Admitting: Internal Medicine

## 2023-05-21 ENCOUNTER — Ambulatory Visit: Payer: BC Managed Care – PPO | Admitting: Internal Medicine

## 2023-05-22 ENCOUNTER — Ambulatory Visit: Payer: BC Managed Care – PPO | Admitting: Internal Medicine

## 2023-05-22 ENCOUNTER — Encounter: Payer: Self-pay | Admitting: Internal Medicine

## 2023-05-22 VITALS — BP 124/80 | HR 77 | Temp 97.9°F | Ht 68.0 in | Wt 275.6 lb

## 2023-05-22 DIAGNOSIS — G4733 Obstructive sleep apnea (adult) (pediatric): Secondary | ICD-10-CM

## 2023-05-22 DIAGNOSIS — E7800 Pure hypercholesterolemia, unspecified: Secondary | ICD-10-CM | POA: Diagnosis not present

## 2023-05-22 DIAGNOSIS — H9313 Tinnitus, bilateral: Secondary | ICD-10-CM | POA: Diagnosis not present

## 2023-05-22 DIAGNOSIS — E66813 Obesity, class 3: Secondary | ICD-10-CM | POA: Diagnosis not present

## 2023-05-22 DIAGNOSIS — H9193 Unspecified hearing loss, bilateral: Secondary | ICD-10-CM | POA: Insufficient documentation

## 2023-05-22 DIAGNOSIS — G720 Drug-induced myopathy: Secondary | ICD-10-CM

## 2023-05-22 DIAGNOSIS — Z23 Encounter for immunization: Secondary | ICD-10-CM

## 2023-05-22 DIAGNOSIS — I700 Atherosclerosis of aorta: Secondary | ICD-10-CM

## 2023-05-22 DIAGNOSIS — R030 Elevated blood-pressure reading, without diagnosis of hypertension: Secondary | ICD-10-CM

## 2023-05-22 DIAGNOSIS — T466X5A Adverse effect of antihyperlipidemic and antiarteriosclerotic drugs, initial encounter: Secondary | ICD-10-CM

## 2023-05-22 DIAGNOSIS — R7309 Other abnormal glucose: Secondary | ICD-10-CM | POA: Insufficient documentation

## 2023-05-22 DIAGNOSIS — L309 Dermatitis, unspecified: Secondary | ICD-10-CM

## 2023-05-22 DIAGNOSIS — I10 Essential (primary) hypertension: Secondary | ICD-10-CM | POA: Diagnosis not present

## 2023-05-22 DIAGNOSIS — Z6841 Body Mass Index (BMI) 40.0 and over, adult: Secondary | ICD-10-CM

## 2023-05-22 LAB — HEMOGLOBIN A1C
Est. average glucose Bld gHb Est-mCnc: 131 mg/dL
Hgb A1c MFr Bld: 6.2 % — ABNORMAL HIGH (ref 4.8–5.6)

## 2023-05-22 LAB — CMP14+EGFR
ALT: 18 IU/L (ref 0–32)
AST: 20 IU/L (ref 0–40)
Albumin: 4.4 g/dL (ref 3.8–4.9)
Alkaline Phosphatase: 78 IU/L (ref 44–121)
BUN/Creatinine Ratio: 14 (ref 9–23)
BUN: 10 mg/dL (ref 6–24)
Bilirubin Total: 0.2 mg/dL (ref 0.0–1.2)
CO2: 25 mmol/L (ref 20–29)
Calcium: 9.7 mg/dL (ref 8.7–10.2)
Chloride: 104 mmol/L (ref 96–106)
Creatinine, Ser: 0.72 mg/dL (ref 0.57–1.00)
Globulin, Total: 2.5 g/dL (ref 1.5–4.5)
Glucose: 88 mg/dL (ref 70–99)
Potassium: 4.4 mmol/L (ref 3.5–5.2)
Sodium: 143 mmol/L (ref 134–144)
Total Protein: 6.9 g/dL (ref 6.0–8.5)
eGFR: 99 mL/min/{1.73_m2} (ref >59)

## 2023-05-22 LAB — LIPID PANEL
Chol/HDL Ratio: 4.9 ratio — ABNORMAL HIGH (ref 0.0–4.4)
Cholesterol, Total: 209 mg/dL — ABNORMAL HIGH (ref 100–199)
HDL: 43 mg/dL (ref >39)
LDL Chol Calc (NIH): 142 mg/dL — ABNORMAL HIGH (ref 0–99)
Triglycerides: 132 mg/dL (ref 0–149)
VLDL Cholesterol Cal: 24 mg/dL (ref 5–40)

## 2023-05-22 MED ORDER — AMLODIPINE BESYLATE 2.5 MG PO TABS: 2.5000 mg | ORAL_TABLET | Freq: Every day | ORAL | 11 refills | Status: DC

## 2023-05-22 MED ORDER — TRIAMCINOLONE ACETONIDE 0.1 % EX CREA: TOPICAL_CREAM | CUTANEOUS | 0 refills | Status: AC

## 2023-05-22 NOTE — Assessment & Plan Note (Signed)
 I will send rx triamcinolone cream to apply to affected area twice daily as needed, for up to 10 days. Advised to apply sparingly.

## 2023-05-22 NOTE — Patient Instructions (Signed)
 Hypertension, Adult Hypertension is another name for high blood pressure. High blood pressure forces your heart to work harder to pump blood. This can cause problems over time. There are two numbers in a blood pressure reading. There is a top number (systolic) over a bottom number (diastolic). It is best to have a blood pressure that is below 120/80. What are the causes? The cause of this condition is not known. Some other conditions can lead to high blood pressure. What increases the risk? Some lifestyle factors can make you more likely to develop high blood pressure: Smoking. Not getting enough exercise or physical activity. Being overweight. Having too much fat, sugar, calories, or salt (sodium) in your diet. Drinking too much alcohol. Other risk factors include: Having any of these conditions: Heart disease. Diabetes. High cholesterol. Kidney disease. Obstructive sleep apnea. Having a family history of high blood pressure and high cholesterol. Age. The risk increases with age. Stress. What are the signs or symptoms? High blood pressure may not cause symptoms. Very high blood pressure (hypertensive crisis) may cause: Headache. Fast or uneven heartbeats (palpitations). Shortness of breath. Nosebleed. Vomiting or feeling like you may vomit (nauseous). Changes in how you see. Very bad chest pain. Feeling dizzy. Seizures. How is this treated? This condition is treated by making healthy lifestyle changes, such as: Eating healthy foods. Exercising more. Drinking less alcohol. Your doctor may prescribe medicine if lifestyle changes do not help enough and if: Your top number is above 130. Your bottom number is above 80. Your personal target blood pressure may vary. Follow these instructions at home: Eating and drinking  If told, follow the DASH eating plan. To follow this plan: Fill one half of your plate at each meal with fruits and vegetables. Fill one fourth of your plate  at each meal with whole grains. Whole grains include whole-wheat pasta, brown rice, and whole-grain bread. Eat or drink low-fat dairy products, such as skim milk or low-fat yogurt. Fill one fourth of your plate at each meal with low-fat (lean) proteins. Low-fat proteins include fish, chicken without skin, eggs, beans, and tofu. Avoid fatty meat, cured and processed meat, or chicken with skin. Avoid pre-made or processed food. Limit the amount of salt in your diet to less than 1,500 mg each day. Do not drink alcohol if: Your doctor tells you not to drink. You are pregnant, may be pregnant, or are planning to become pregnant. If you drink alcohol: Limit how much you have to: 0-1 drink a day for women. 0-2 drinks a day for men. Know how much alcohol is in your drink. In the [place] one drink equals one 12 oz bottle of beer (355 mL), one 5 oz glass of wine (148 mL), or one 1 oz glass of hard liquor (44 mL). Lifestyle  Work with your doctor to stay at a healthy weight or to lose weight. Ask your doctor what the best weight is for you. Get at least 30 minutes of exercise that causes your heart to beat faster (aerobic exercise) most days of the week. This may include walking, swimming, or biking. Get at least 30 minutes of exercise that strengthens your muscles (resistance exercise) at least 3 days a week. This may include lifting weights or doing Pilates. Do not smoke or use any products that contain nicotine or tobacco. If you need help quitting, ask your doctor. Check your blood pressure at home as told by your doctor. Keep all follow-up visits. Medicines Take over-the-counter and prescription medicines  only as told by your doctor. Follow directions carefully. Do not skip doses of blood pressure medicine. The medicine does not work as well if you skip doses. Skipping doses also puts you at risk for problems. Ask your doctor about side effects or reactions to medicines that you should watch  for. Contact a doctor if: You think you are having a reaction to the medicine you are taking. You have headaches that keep coming back. You feel dizzy. You have swelling in your ankles. You have trouble with your vision. Get help right away if: You get a very bad headache. You start to feel mixed up (confused). You feel weak or numb. You feel faint. You have very bad pain in your: Chest. Belly (abdomen). You vomit more than once. You have trouble breathing. These symptoms may be an emergency. Get help right away. Call 911. Do not wait to see if the symptoms will go away. Do not drive yourself to the hospital. Summary Hypertension is another name for high blood pressure. High blood pressure forces your heart to work harder to pump blood. For most people, a normal blood pressure is less than 120/80. Making healthy choices can help lower blood pressure. If your blood pressure does not get lower with healthy choices, you may need to take medicine. This information is not intended to replace advice given to you by your health care provider. Make sure you discuss any questions you have with your health care provider. Document Revised: 11/25/2020 Document Reviewed: 11/25/2020 Elsevier Patient Education  2024 [employer].

## 2023-05-22 NOTE — Assessment & Plan Note (Signed)
 Seen on coronary CTA. 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  She has been prescribed Repatha by [facility]. She never started it because she was concerned it would raise her sugar. Risks of not getting to LDL goal was discussed with the patient.  She is encouraged to follow a heart healthy lifestyle.

## 2023-05-22 NOTE — Progress Notes (Signed)
 I,[name], CMA,acting as a Neurosurgeon for Tanya Aliment, MD.,have documented all relevant documentation on the behalf of Tanya Aliment, MD,as directed by  Tanya Aliment, MD while in the presence of Tanya Aliment, MD.  Subjective:  Patient ID: [name] , female    DOB: 1970-05-30 , 53 y.o.   MRN: [id number]  Chief Complaint  Patient presents with   Hypertension    HPI  Patient presents today for a bp check. Patient reports compliance with her meds. Patient denies having chest pain, sob or headaches at this time.   Hyperlipidemia This is a chronic problem. The current episode started more than 1 year ago. The problem is uncontrolled. Recent lipid tests were reviewed and are high. Exacerbating diseases include obesity. Pertinent negatives include no focal sensory loss or shortness of breath. Current antihyperlipidemic treatment includes diet change. The current treatment provides moderate improvement of lipids.     Past Medical History:  Diagnosis Date   Abdominal pain    Allergy    Allergy-induced asthma    Angina 05/2011   NO HEART PROBLEM WENT TO ED 05/22/11 FOR CP STRESS TEST WAS NEGATIVE    Ankle fracture    Aortic atherosclerosis (HCC) 09/01/2021   Chronic back pain greater than 3 months duration 01/17/2011   injured lumbar and posterior cervical neck in fall    Constipation    DDD (degenerative disc disease), cervical    Diverticulitis    Gallbladder problem    GERD (gastroesophageal reflux disease)    H/O hiatal hernia    Hyperlipidemia    IBS (irritable bowel syndrome)    Nausea    Neuromuscular disorder (HCC)    LUMBA BACK  TROUBLE    Pre-diabetes    Rash    UNDER BIL BREASTS   Wrist fracture      Family History  Problem Relation Age of Onset   Diabetes Mother    Hyperlipidemia Mother    Cancer Mother    Sleep apnea Mother    Obesity Mother    Heart attack Father    Cancer Father        kidney and prostate   Hyperlipidemia Father     Other Father        gout and clogged arteries   Hypertension Father    Kidney disease Father    Alcoholism Father    Renal Disease Father    Hyperlipidemia Sister    Diabetes Sister    Diabetes Brother    Hyperlipidemia Brother    Coronary artery disease Brother    Heart disease Maternal Grandmother      Current Outpatient Medications:    albuterol (VENTOLIN HFA) 108 (90 Base) MCG/ACT inhaler, Inhale 1 puff into the lungs every 6 (six) hours as needed for wheezing or shortness of breath., Disp: 18 g, Rfl: 0   amLODipine (NORVASC) 2.5 MG tablet, Take 1 tablet (2.5 mg total) by mouth daily., Disp: 30 tablet, Rfl: 11   butalbital-acetaminophen-caffeine (FIORICET) 50-325-40 MG tablet, One tab po q8h prn headache, Disp: 20 tablet, Rfl: 0   cetirizine (ZYRTEC ALLERGY) 10 MG tablet, Take 1 tablet (10 mg total) by mouth daily., Disp: 30 tablet, Rfl: 0   cyclobenzaprine (FLEXERIL) 5 MG tablet, Take 1 tablet (5 mg total) by mouth 3 (three) times daily as needed for muscle spasms., Disp: 30 tablet, Rfl: 0   hydrOXYzine (ATARAX) 25 MG tablet, ONE TAB PO BID PRN, Disp: 30 tablet, Rfl: 0  ketoconazole (NIZORAL) 2 % cream, Apply 1 Application topically daily., Disp: 60 g, Rfl: 0   nystatin powder, Apply 1 Application topically 3 (three) times daily., Disp: 45 g, Rfl: 1   omeprazole (PRILOSEC) 40 MG capsule, Take 1 capsule (40 mg total) by mouth daily. Take 30 minutes prior to breakfast., Disp: 30 capsule, Rfl: 3   triamcinolone cream (KENALOG) 0.1 %, APPLY TO AFFECTED AREA TWICE DAILY AS NEEDED, Disp: 30 g, Rfl: 0   Allergies  Allergen Reactions   Amoxicillin Hives   Sulfa Antibiotics Hives   Penicillins Hives and Itching    Has patient had a PCN reaction causing immediate rash, facial/tongue/throat swelling, SOB or lightheadedness with hypotension: Yes Has patient had a PCN reaction causing severe rash involving mucus membranes or skin necrosis: No Has patient had a PCN reaction that required  hospitalization No Has patient had a PCN reaction occurring within the last 10 years: Yes If all of the above answers are "NO", then may proceed with Cephalosporin use.    Tape     PLASTIC TAPE   (WHELPS, TORE SKIN)   Tramadol Hives     Review of Systems  Constitutional: Negative.   HENT:  Positive for tinnitus.        She c/o ears ringing and ear pain. She is currently taking Zyrtec for her allergies. Her husband has noticed that her hearing has decreased.   Respiratory: Negative.  Negative for shortness of breath.   Cardiovascular: Negative.   Gastrointestinal: Negative.   Musculoskeletal: Negative.   Skin: Negative.   Psychiatric/Behavioral: Negative.       Today's Vitals   05/22/23 0833  BP: 124/80  Pulse: 77  Temp: 97.9 F (36.6 C)  TempSrc: Oral  Weight: 275 lb 9.6 oz (125 kg)  Height: 5\' 8"  (1.727 m)  PainSc: 0-No pain   Body mass index is 41.9 kg/m.  Wt Readings from Last 3 Encounters:  05/22/23 275 lb 9.6 oz (125 kg)  03/29/23 268 lb (121.6 kg)  01/11/23 264 lb 6.4 oz (119.9 kg)    BP Readings from Last 3 Encounters:  05/22/23 124/80  03/29/23 (!) 140/89  01/11/23 120/82     The 10-year ASCVD risk score (Arnett DK, et al., 2019) is: 5.3%   Values used to calculate the score:     Age: 59 years     Sex: Female     Is Non-Hispanic African American: Yes     Diabetic: No     Tobacco smoker: No     Systolic Blood Pressure: 124 mmHg     Is BP treated: Yes     HDL Cholesterol: 49 mg/dL     Total Cholesterol: 232 mg/dL  Objective:  Physical Exam Vitals and nursing note reviewed.  Constitutional:      Appearance: Normal appearance. She is obese.  HENT:     Head: Normocephalic and atraumatic.     Right Ear: Tympanic membrane, ear canal and external ear normal. There is no impacted cerumen.     Left Ear: Tympanic membrane and ear canal normal. There is no impacted cerumen.     Ears:     Comments: Scaly rash left ear canal, no overlying erythema Eyes:      Extraocular Movements: Extraocular movements intact.  Cardiovascular:     Rate and Rhythm: Normal rate and regular rhythm.     Heart sounds: Normal heart sounds.  Pulmonary:     Effort: Pulmonary effort is normal.  Breath sounds: Normal breath sounds.  Musculoskeletal:     Cervical back: Normal range of motion.  Skin:    General: Skin is warm.  Neurological:     General: No focal deficit present.     Mental Status: She is alert.  Psychiatric:        Mood and Affect: Mood normal.        Behavior: Behavior normal.         Assessment And Plan:  Pure hypercholesterolemia Assessment & Plan: Chronic, will check labs as below. She is not on any therapy, statins cause myopathy.  Pt advised if lipoprotein(a) levels are elevated, it is imperative that she comply with statin/antihyperlipidemic therapy and to adopt a heart healthy lifestyle. She is already followed by Cardiology. She agrees to consider again in the future. Also agrees to [name] referral.   Orders: -     CMP14+EGFR -     Lipid panel -     AMB Referral [facility] Care Management  Aortic atherosclerosis [facility]) Assessment & Plan: Seen on coronary CTA. 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  She has been prescribed Repatha by [facility]. She never started it because she was concerned it would raise her sugar. Risks of not getting to LDL goal was discussed with the patient.  She is encouraged to follow a heart healthy lifestyle.   Orders: -     CMP14+EGFR -     Lipid panel -     AMB Referral [facility] Care Management  Essential hypertension, benign -     AMB Referral [facility] Care Management  Tinnitus of both ears Assessment & Plan: She agrees to Audiology evaluation, prior to ENT eval. Will refer if indicated.   Orders: -     Ambulatory referral to Audiology  Bilateral hearing loss, unspecified hearing loss type Assessment & Plan: She states her husband has commented on her hearing. She agrees to  Audiology evaluation.   Orders: -     Ambulatory referral to Audiology  Dermatitis Assessment & Plan: I will send rx triamcinolone cream to apply to affected area twice daily as needed, for up to 10 days. Advised to apply sparingly.    OSA on CPAP Assessment & Plan: Chronic, reports compliance with CPAP. Will send rx Zepbound - this would compliment her weight loss efforts. She does report family history of thyroid cancer, she will try to find out the specific type.   Orders: -     AMB Referral [facility] Care Management  Other abnormal glucose Assessment & Plan: Previous labs reviewed, her A1c has been elevated in the past. I will check an A1c today. Reminded to avoid refined sugars including sugary drinks/foods and processed meats including bacon, sausages and deli meats.    Orders: -     Hemoglobin A1c  Statin myopathy -     AMB Referral [facility] Care Management  Class 3 severe obesity due to excess calories with body mass index (BMI) of 40.0 to 44.9 in adult, unspecified whether serious comorbidity present [facility]) Assessment & Plan: BMI 41.  She agrees to referral to [facility] clinic. Encouraged to gradually work up to 150 minutes per week.   Orders: -     Amb Ref to Medical Weight Management  Need for pneumococcal 20-valent conjugate vaccination -     Pneumococcal conjugate vaccine 20-valent  Other orders -     amLODIPine Besylate; Take 1 tablet (2.5 mg total) by mouth daily.  Dispense: 30 tablet;  Refill: 11 -     Triamcinolone Acetonide; APPLY TO AFFECTED AREA TWICE DAILY AS NEEDED  Dispense: 30 g; Refill: 0    Return in 2 weeks (on 06/05/2023), or [place]-bp check.  Patient was given opportunity to ask questions. Patient verbalized understanding of the plan and was able to repeat key elements of the plan. All questions were answered to their satisfaction.    I, Tanya Aliment, MD, have reviewed all documentation for this visit. The documentation on 05/22/23 for the exam, diagnosis,  procedures, and orders are all accurate and complete.   IF YOU HAVE BEEN REFERRED TO A SPECIALIST, IT MAY TAKE 1-2 WEEKS TO SCHEDULE/PROCESS THE REFERRAL. IF YOU HAVE NOT HEARD FROM US/SPECIALIST IN TWO WEEKS, PLEASE GIVE [place] A CALL AT [phone].

## 2023-05-22 NOTE — Assessment & Plan Note (Signed)
 BMI 41.  She agrees to referral to [facility] clinic. Encouraged to gradually work up to 150 minutes per week.

## 2023-05-22 NOTE — Assessment & Plan Note (Signed)
 Previous labs reviewed, her A1c has been elevated in the past. I will check an A1c today. Reminded to avoid refined sugars including sugary drinks/foods and processed meats including bacon, sausages and deli meats.

## 2023-05-22 NOTE — Assessment & Plan Note (Addendum)
 Chronic, will check labs as below. She is not on any therapy, statins cause myopathy.  Pt advised if lipoprotein(a) levels are elevated, it is imperative that she comply with statin/antihyperlipidemic therapy and to adopt a heart healthy lifestyle. She is already followed by Cardiology. She agrees to consider again in the future. Also agrees to [facility] referral.

## 2023-05-22 NOTE — Assessment & Plan Note (Signed)
 She agrees to Audiology evaluation, prior to ENT eval. Will refer if indicated.

## 2023-05-22 NOTE — Progress Notes (Signed)
 I,[name], CMA,acting as a Neurosurgeon for Tanya Aliment, MD.,have documented all relevant documentation on the behalf of Tanya Aliment, MD,as directed by  Tanya Aliment, MD while in the presence of Tanya Aliment, MD.  Subjective:  Patient ID: [name] , female    DOB: 06-May-1970 , 53 y.o.   MRN: [id number]  No chief complaint on file.   HPI  HPI   Past Medical History:  Diagnosis Date   Abdominal pain    Allergy    Allergy-induced asthma    Angina 05/2011   NO HEART PROBLEM WENT TO ED 05/22/11 FOR CP STRESS TEST WAS NEGATIVE    Ankle fracture    Aortic atherosclerosis (HCC) 09/01/2021   Chronic back pain greater than 3 months duration 01/17/2011   injured lumbar and posterior cervical neck in fall    Constipation    DDD (degenerative disc disease), cervical    Diverticulitis    Gallbladder problem    GERD (gastroesophageal reflux disease)    H/O hiatal hernia    Hyperlipidemia    IBS (irritable bowel syndrome)    Nausea    Neuromuscular disorder (HCC)    LUMBA BACK  TROUBLE    Pre-diabetes    Rash    UNDER BIL BREASTS   Wrist fracture      Family History  Problem Relation Age of Onset   Diabetes Mother    Hyperlipidemia Mother    Cancer Mother    Sleep apnea Mother    Obesity Mother    Heart attack Father    Cancer Father        kidney and prostate   Hyperlipidemia Father    Other Father        gout and clogged arteries   Hypertension Father    Kidney disease Father    Alcoholism Father    Renal Disease Father    Hyperlipidemia Sister    Diabetes Sister    Diabetes Brother    Hyperlipidemia Brother    Coronary artery disease Brother    Heart disease Maternal Grandmother      Current Outpatient Medications:    albuterol (VENTOLIN HFA) 108 (90 Base) MCG/ACT inhaler, Inhale 1 puff into the lungs every 6 (six) hours as needed for wheezing or shortness of breath., Disp: 18 g, Rfl: 0   butalbital-acetaminophen-caffeine (FIORICET)  50-325-40 MG tablet, One tab po q8h prn headache, Disp: 20 tablet, Rfl: 0   cetirizine (ZYRTEC ALLERGY) 10 MG tablet, Take 1 tablet (10 mg total) by mouth daily., Disp: 30 tablet, Rfl: 0   cyclobenzaprine (FLEXERIL) 5 MG tablet, Take 1 tablet (5 mg total) by mouth 3 (three) times daily as needed for muscle spasms., Disp: 30 tablet, Rfl: 0   hydrOXYzine (ATARAX) 25 MG tablet, ONE TAB PO BID PRN, Disp: 30 tablet, Rfl: 0   ketoconazole (NIZORAL) 2 % cream, Apply 1 Application topically daily., Disp: 60 g, Rfl: 0   nystatin powder, Apply 1 Application topically 3 (three) times daily., Disp: 45 g, Rfl: 1   omeprazole (PRILOSEC) 40 MG capsule, Take 1 capsule (40 mg total) by mouth daily. Take 30 minutes prior to breakfast., Disp: 30 capsule, Rfl: 3   triamcinolone cream (KENALOG) 0.1 %, APPLY TO AFFECTED AREA TWICE DAILY AS NEEDED, Disp: 45 g, Rfl: 0   Allergies  Allergen Reactions   Amoxicillin Hives   Sulfa Antibiotics Hives   Penicillins Hives and Itching    Has patient had a PCN  reaction causing immediate rash, facial/tongue/throat swelling, SOB or lightheadedness with hypotension: Yes Has patient had a PCN reaction causing severe rash involving mucus membranes or skin necrosis: No Has patient had a PCN reaction that required hospitalization No Has patient had a PCN reaction occurring within the last 10 years: Yes If all of the above answers are "NO", then may proceed with Cephalosporin use.    Tape     PLASTIC TAPE   (WHELPS, TORE SKIN)   Tramadol Hives     Review of Systems   There were no vitals filed for this visit. There is no height or weight on file to calculate BMI.  Wt Readings from Last 3 Encounters:  03/29/23 268 lb (121.6 kg)  01/11/23 264 lb 6.4 oz (119.9 kg)  11/20/22 260 lb (117.9 kg)     Objective:  Physical Exam      Assessment And Plan:  There are no diagnoses linked to this encounter.   No follow-ups on file.  Patient was given opportunity to ask  questions. Patient verbalized understanding of the plan and was able to repeat key elements of the plan. All questions were answered to their satisfaction.  Tanya Aliment, MD  I, Tanya Aliment, MD, have reviewed all documentation for this visit. The documentation on 05/22/23 for the exam, diagnosis, procedures, and orders are all accurate and complete.   IF YOU HAVE BEEN REFERRED TO A SPECIALIST, IT MAY TAKE 1-2 WEEKS TO SCHEDULE/PROCESS THE REFERRAL. IF YOU HAVE NOT HEARD FROM US/SPECIALIST IN TWO WEEKS, PLEASE GIVE [place] A CALL AT [phone].   THE PATIENT IS ENCOURAGED TO PRACTICE SOCIAL DISTANCING DUE TO THE COVID-19 PANDEMIC.

## 2023-05-22 NOTE — Assessment & Plan Note (Addendum)
 Chronic, reports compliance with CPAP. Will send rx Zepbound - this would compliment her weight loss efforts. She does report family history of thyroid cancer, she will try to find out the specific type.

## 2023-05-22 NOTE — Assessment & Plan Note (Signed)
 She states her husband has commented on her hearing. She agrees to Audiology evaluation.

## 2023-05-23 ENCOUNTER — Encounter: Payer: Self-pay | Admitting: Internal Medicine

## 2023-06-05 ENCOUNTER — Telehealth: Payer: Self-pay | Admitting: Pharmacist

## 2023-06-05 DIAGNOSIS — E782 Mixed hyperlipidemia: Secondary | ICD-10-CM

## 2023-06-05 NOTE — Progress Notes (Signed)
   06/05/2023  Patient ID: [name], female   DOB: 1970/05/07, 53 y.o.   MRN: [id number]  Called patient per referral for Repatha restart. Unfortunately, she did not answer her phone. HIPAA compliant message was left on her voicemail.    Plan: Call patient back in 1-3 business days.     Geronimo Krabbe, PharmD, BCACP Clinical Pharmacist [phone]

## 2023-06-07 ENCOUNTER — Telehealth: Payer: Self-pay | Admitting: Pharmacist

## 2023-06-07 DIAGNOSIS — E782 Mixed hyperlipidemia: Secondary | ICD-10-CM

## 2023-06-07 NOTE — Progress Notes (Signed)
   06/07/2023  Patient ID: [name], female   DOB: 21-Mar-1970, 53 y.o.   MRN: [id number]    Called patient per referral for Repatha restart. Unfortunately, she did not answer her phone. HIPAA compliant message was left on her voicemail requesting a call back.  Today's call was the second unsuccessful call.   Plan: Call patient back in 5-7 business days.       Geronimo Krabbe, PharmD, BCACP Clinical Pharmacist [phone]

## 2023-06-08 ENCOUNTER — Ambulatory Visit
Admission: EM | Admit: 2023-06-08 | Discharge: 2023-06-08 | Disposition: A | Discharge status: Home / Self Care | Attending: Family Medicine | Admitting: Family Medicine

## 2023-06-08 DIAGNOSIS — J4521 Mild intermittent asthma with (acute) exacerbation: Secondary | ICD-10-CM | POA: Diagnosis not present

## 2023-06-08 DIAGNOSIS — B349 Viral infection, unspecified: Secondary | ICD-10-CM | POA: Diagnosis not present

## 2023-06-08 DIAGNOSIS — R051 Acute cough: Secondary | ICD-10-CM | POA: Diagnosis not present

## 2023-06-08 LAB — POC COVID19/FLU A&B COMBO
Covid Antigen, POC: NEGATIVE
Influenza A Antigen, POC: NEGATIVE
Influenza B Antigen, POC: NEGATIVE

## 2023-06-08 MED ORDER — PROMETHAZINE-DM 6.25-15 MG/5ML PO SYRP: 5.0000 mL | ORAL_SOLUTION | Freq: Four times a day (QID) | ORAL | 0 refills | Status: DC | PRN

## 2023-06-08 MED ORDER — IPRATROPIUM-ALBUTEROL 0.5-2.5 (3) MG/3ML IN SOLN
3.0000 mL | Freq: Once | RESPIRATORY_TRACT | Status: AC
  Administered 2023-06-08: 3 mL via RESPIRATORY_TRACT

## 2023-06-08 MED ORDER — ALBUTEROL SULFATE HFA 108 (90 BASE) MCG/ACT IN AERS: 1.0000 | INHALATION_SPRAY | Freq: Four times a day (QID) | RESPIRATORY_TRACT | 0 refills | Status: DC | PRN

## 2023-06-08 MED ORDER — FLUTICASONE PROPIONATE 50 MCG/ACT NA SUSP: 1.0000 | Freq: Every day | NASAL | 0 refills | Status: DC

## 2023-06-08 MED ORDER — PREDNISONE 20 MG PO TABS: 40.0000 mg | ORAL_TABLET | Freq: Every day | ORAL | 0 refills | Status: AC

## 2023-06-08 NOTE — ED Provider Notes (Addendum)
 [facility]    CSN: [id number] Arrival date & time: 06/08/23  9167      History   Chief Complaint Chief Complaint  Patient presents with   Facial Pain    HPI [name] is a 53 y.o. female  presents for evaluation of URI symptoms for 3 days. Patient reports associated symptoms of cough, congestion, chest tightness/wheezing/shortness of breath, headache, postnasal drip with irritated throat. Denies N/V/D, fevers, ear pain, body aches. Patient does have a hx of asthma.  States her inhaler is expired and she has not used it since symptom onset.  Patient is not an active smoker.   Reports her husband has had a cough for couple weeks.  Pt has taken cold medicine OTC for symptoms. Pt has no other concerns at this time.   HPI  Past Medical History:  Diagnosis Date   Abdominal pain    Allergy     Allergy -induced asthma    Angina 05/2011   NO HEART PROBLEM WENT TO ED 05/22/11 FOR CP STRESS TEST WAS NEGATIVE    Ankle fracture    Aortic atherosclerosis (HCC) 09/01/2021   Chronic back pain greater than 3 months duration 01/17/2011   injured lumbar and posterior cervical neck in fall    Constipation    DDD (degenerative disc disease), cervical    Diverticulitis    Gallbladder problem    GERD (gastroesophageal reflux disease)    H/O hiatal hernia    Hyperlipidemia    IBS (irritable bowel syndrome)    Nausea    Neuromuscular disorder (HCC)    LUMBA BACK  TROUBLE    Pre-diabetes    Rash    UNDER BIL BREASTS   Wrist fracture     Patient Active Problem List   Diagnosis Date Noted   Class 3 severe obesity due to excess calories with body mass index (BMI) of 40.0 to 44.9 in adult (HCC) 05/22/2023   Tinnitus of both ears 05/22/2023   Bilateral hearing loss 05/22/2023   Dermatitis 05/22/2023   Other abnormal glucose 05/22/2023   Tension headache 01/11/2023   Acute non-recurrent frontal sinusitis 01/11/2023   Work stress 01/11/2023   Encounter for general adult  medical examination w/o abnormal findings 11/20/2022   Statin myopathy 11/20/2022   Chronic urticaria 10/04/2022   Hoarseness, chronic 10/04/2022   Intertrigo 03/19/2022   Rash and nonspecific skin eruption 03/19/2022   Vertigo 12/26/2021   Unilateral headache 12/26/2021   Immunization declined 12/26/2021   Postmenopausal bleeding 11/29/2021   Aortic atherosclerosis (HCC) 09/01/2021   OSA on CPAP 08/30/2021   Hypoxemia 08/30/2021   Claustrophobia 06/07/2021   GERD with apnea 06/07/2021   Sleep choking syndrome 06/07/2021   Snorings 06/07/2021   Primary hypertension 06/07/2021   Class 2 obesity due to excess calories without serious comorbidity with body mass index (BMI) of 36.0 to 36.9 in adult 06/07/2021   Prediabetes 06/07/2021   Atypical chest pain 04/04/2021   Snoring 04/04/2021   Family history of heart disease 04/04/2021   Herpes simplex 04/02/2020   Hyperlipidemia 04/02/2020   Inguinal hernia 04/02/2020   Diverticulitis 04/02/2020   History of tubal ligation 04/02/2020   Loss of taste 01/02/2019   Nasal congestion 01/02/2019   Vitamin D  deficiency 02/25/2018   Gastroesophageal reflux disease without esophagitis 02/25/2018   Class 2 obesity due to excess calories without serious comorbidity with body mass index (BMI) of 37.0 to 37.9 in adult 02/25/2018   Bilateral impacted cerumen 02/07/2018   Non-seasonal  allergic rhinitis 02/07/2018   Chronic low back pain 02/23/2017   Acute diverticulitis 04/02/2013   Back pain    Hiatal hernia 04/23/2012   Abdominal pain 03/26/2012   Incisional hernia 04/21/2011   Postop check 10/04/2010   Colon polyp 08/23/2010   Poor appetite 08/23/2010   Abdominal pain 08/23/2010   Pure hypercholesterolemia 08/23/2010   Spontaneous ecchymoses 08/23/2010   Asthma 08/23/2010   Wears glasses 08/23/2010   Biliary dyskinesia 08/23/2010    Past Surgical History:  Procedure Laterality Date   BREAST CYST EXCISION Left ~ 1983    CHOLECYSTECTOMY  08/2010   COLONOSCOPY  07/2010   Removal of 3 polyps   DILATATION & CURETTAGE/HYSTEROSCOPY WITH MYOSURE N/A 09/03/2015   Procedure: DILATATION & CURETTAGE/HYSTEROSCOPY WITH MYOSURE;  Surgeon: Dickie Carder, MD;  Location: [facility];  Service: Gynecology;  Laterality: N/A;   FRACTURE SURGERY  06/12/11   patient denies   HERNIA REPAIR  1997; 05/2011   umbilical corrected during tubal ligation; VHR w/mesh   TUBAL LIGATION  12/1995   VENTRAL HERNIA REPAIR  06/12/2011   Procedure: LAPAROSCOPIC VENTRAL HERNIA;  Surgeon: Lynwood MALVA Pina, MD;  Location: [facility];  Service: General;  Laterality: N/A;   WISDOM TOOTH EXTRACTION  1990's    OB History     Gravida  4   Para      Term      Preterm      AB      Living         SAB      IAB      Ectopic      Multiple      Live Births               Home Medications    Prior to Admission medications   Medication Sig Start Date End Date Taking? Authorizing Provider  albuterol  (VENTOLIN  HFA) 108 (90 Base) MCG/ACT inhaler Inhale 1-2 puffs into the lungs every 6 (six) hours as needed. 06/08/23  Yes Calysta Craigo, Jodi R, NP  fluticasone  (FLONASE ) 50 MCG/ACT nasal spray Place 1 spray into both nostrils daily. 06/08/23  Yes Roxy Mastandrea, Jodi R, NP  predniSONE  (DELTASONE ) 20 MG tablet Take 2 tablets (40 mg total) by mouth daily with breakfast for 5 days. 06/08/23 06/13/23 Yes Tyse Auriemma, Jodi R, NP  promethazine -dextromethorphan (PROMETHAZINE -DM) 6.25-15 MG/5ML syrup Take 5 mLs by mouth 4 (four) times daily as needed for cough. 06/08/23  Yes Chalese Peach, Jodi R, NP  amLODipine  (NORVASC ) 2.5 MG tablet Take 1 tablet (2.5 mg total) by mouth daily. 05/22/23 05/21/24  [name], MD  butalbital -acetaminophen -caffeine  (FIORICET) 50-325-40 MG tablet One tab po q8h prn headache 06/14/22   [name], MD  cetirizine  (ZYRTEC  ALLERGY ) 10 MG tablet Take 1 tablet (10 mg total) by mouth daily. 05/07/22   [name], [name]  cyclobenzaprine  (FLEXERIL ) 5 MG tablet Take  1 tablet (5 mg total) by mouth 3 (three) times daily as needed for muscle spasms. 01/11/23   [name], MD  hydrOXYzine  (ATARAX ) 25 MG tablet ONE TAB PO BID PRN 03/13/22   [name], MD  ketoconazole  (NIZORAL ) 2 % cream Apply 1 Application topically daily. 05/07/22   [name], [name]  nystatin  powder Apply 1 Application topically 3 (three) times daily. 03/13/22   [name], MD  omeprazole  (PRILOSEC) 40 MG capsule Take 1 capsule (40 mg total) by mouth daily. Take 30 minutes prior to breakfast. 10/04/22   Marinda Rocky SAILOR, MD  triamcinolone  cream (KENALOG ) 0.1 % APPLY  TO AFFECTED AREA TWICE DAILY AS NEEDED 05/22/23   [name], MD    Family History Family History  Problem Relation Age of Onset   Diabetes Mother    Hyperlipidemia Mother    Cancer Mother    Sleep apnea Mother    Obesity Mother    Heart attack Father    Cancer Father        kidney and prostate   Hyperlipidemia Father    Other Father        gout and clogged arteries   Hypertension Father    Kidney disease Father    Alcoholism Father    Renal Disease Father    Hyperlipidemia Sister    Diabetes Sister    Diabetes Brother    Hyperlipidemia Brother    Coronary artery disease Brother    Heart disease Maternal Grandmother     Social History Social History   Tobacco Use   Smoking status: Never   Smokeless tobacco: Never  Vaping Use   Vaping status: Never Used  Substance Use Topics   Alcohol use: Yes    Comment: ocass   Drug use: No     Allergies   Amoxicillin, Sulfa antibiotics, Penicillins, Tape, and Tramadol   Review of Systems Review of Systems  HENT:  Positive for congestion and postnasal drip.        Throat irritation   Respiratory:  Positive for cough, chest tightness, shortness of breath and wheezing.   Neurological:  Positive for headaches.     Physical Exam Triage Vital Signs ED Triage Vitals  Encounter Vitals Group     BP 06/08/23 0901 (!) 156/91     Systolic BP Percentile  --      Diastolic BP Percentile --      Pulse Rate 06/08/23 0901 76     Resp 06/08/23 0901 16     Temp 06/08/23 0901 97.7 F (36.5 C)     Temp src --      SpO2 06/08/23 0901 98 %     Weight --      Height --      Head Circumference --      Peak Flow --      Pain Score 06/08/23 0900 3     Pain Loc --      Pain Education --      Exclude from Growth Chart --    No data found.  Updated Vital Signs BP (!) 156/91   Pulse 76   Temp 97.7 F (36.5 C)   Resp 16   LMP 11/05/2015 Comment: tubal ligation  SpO2 98%   Visual Acuity Right Eye Distance:   Left Eye Distance:   Bilateral Distance:    Right Eye Near:   Left Eye Near:    Bilateral Near:     Physical Exam Vitals and nursing note reviewed.  Constitutional:      General: She is not in acute distress.    Appearance: She is well-developed. She is not ill-appearing.  HENT:     Head: Normocephalic and atraumatic.     Right Ear: Tympanic membrane and ear canal normal.     Left Ear: Tympanic membrane and ear canal normal.     Nose: Congestion present.     Mouth/Throat:     Mouth: Mucous membranes are moist.     Pharynx: Oropharynx is clear. Uvula midline. Postnasal drip present. No oropharyngeal exudate or posterior oropharyngeal erythema.     Tonsils: No tonsillar exudate  or tonsillar abscesses.  Eyes:     Conjunctiva/sclera: Conjunctivae normal.     Pupils: Pupils are equal, round, and reactive to light.  Cardiovascular:     Rate and Rhythm: Normal rate and regular rhythm.     Heart sounds: Normal heart sounds.  Pulmonary:     Effort: Pulmonary effort is normal.     Breath sounds: Normal breath sounds. No wheezing, rhonchi or rales.  Musculoskeletal:     Cervical back: Normal range of motion and neck supple.  Lymphadenopathy:     Cervical: No cervical adenopathy.  Skin:    General: Skin is warm and dry.  Neurological:     General: No focal deficit present.     Mental Status: She is alert and oriented to  person, place, and time.  Psychiatric:        Mood and Affect: Mood normal.        Behavior: Behavior normal.      UC Treatments / Results  Labs (all labs ordered are listed, but only abnormal results are displayed) Labs Reviewed  POC COVID19/FLU A&B COMBO   CMP14+EGFR Order: [id number]  Status: Final result     Next appt: 06/12/2023 at 08:20 AM in Internal Medicine [facility])     Dx: Pure hypercholesterolemia; Aortic ath...   Test Result Released: Yes (seen)     Messages: Seen   1 Result Note     1 Patient Communication     View Follow-Up Encounter          Component Ref Range & Units (hover) 2 wk ago (05/22/23) 6 mo ago (11/20/22) 1 yr ago (12/13/21) 1 yr ago (11/03/21) 1 yr ago (09/01/21) 1 yr ago (07/07/21) 2 yr ago (05/19/21)  Glucose 88 94 96 CM  93 89 68 Low   BUN 10 7 10  R  10 9 12   Creatinine, Ser 0.72 0.72 0.61 R  0.81 0.81 0.66  eGFR 99 100   87 87 105  BUN/Creatinine Ratio 14 10   12 11 18   Sodium 143 143 142 R  140 146 High  139  Potassium 4.4 4.4 4.0 R  4.4 4.7 4.3  Chloride 104 104 106 R  101 105 100  CO2 25 25 28  R  25 26 21   Calcium  9.7 9.5 9.2 R  9.8 9.7 10.1  Total Protein 6.9 6.9   7.1 7.4   Albumin 4.4 4.2   4.5 CM 4.6   Globulin, Total 2.5 2.7   2.6 2.8   Bilirubin Total 0.2 0.3   0.4 0.3   Alkaline Phosphatase 78 69   66 70   AST 20 23   21 21    ALT 18 15  14 16 18    Resulting Agency [facility]         Narrative Performed by: HOYT Performed at:  [address] [facility] [address] [place], [place]  [id number] Lab Director: Frankey Sas MD, Phone:  [phone]  Specimen Collected: 05/22/23 09:26 Last Resulted: 05/22/23 23:35   EKG   Radiology No results found.  Procedures Procedures (including critical care time)  Medications Ordered in UC Medications  ipratropium-albuterol  (DUONEB) 0.5-2.5 (3) MG/3ML nebulizer solution 3 mL (3 mLs Nebulization Given 06/08/23 0929)     Initial Impression / Assessment and Plan / UC Course  I have reviewed the triage vital signs and the nursing notes.  Pertinent labs & imaging results that were available during my care of  the patient were reviewed by me and considered in my medical decision making (see chart for details).     I reviewed exam and symptoms with patient.  No red flags.  Negative rapid flu and COVID testing.  Discussed viral illness/asthma exacerbation and symptomatic treatment.  Patient was given DuoNeb in clinic with improvement of her and symptoms.  I did refill her albuterol  inhaler.  Will do prednisone  daily as well as Flonase  daily.  Promethazine  DM as needed for cough.  Discussed rest fluids and PCP follow-up if symptoms do not improve.  ER precautions reviewed and patient verbalized understanding. Final Clinical Impressions(s) / UC Diagnoses   Final diagnoses:  Acute cough  Mild intermittent asthma with acute exacerbation  Viral illness     Discharge Instructions      You have tested negative for COVID and flu. I have refilled your albuterol  inhaler to use for wheezing or shortness of breath. Promethazine  as needed for cough. Please note this medication will make you drowsy. Do not drink alcohol or drive while taking this medication. Flonase  and prednisone  daily. Lots of rest and fluids. Please ofllow up with your PCP if your symptoms do not improve. Please go to the ER for any worsening symptoms. I hope you feel better soon!     ED Prescriptions     Medication Sig Dispense Auth. Provider   promethazine -dextromethorphan (PROMETHAZINE -DM) 6.25-15 MG/5ML syrup Take 5 mLs by mouth 4 (four) times daily as needed for cough. 118 mL Bo Rogue, Jodi R, NP   albuterol  (VENTOLIN  HFA) 108 (90 Base) MCG/ACT inhaler Inhale 1-2 puffs into the lungs every 6 (six) hours as needed. 1 each [name], NP   predniSONE  (DELTASONE ) 20 MG tablet Take 2 tablets (40 mg total) by mouth daily with breakfast for 5 days.  10 tablet Eldred Lievanos, Jodi R, NP   fluticasone  (FLONASE ) 50 MCG/ACT nasal spray Place 1 spray into both nostrils daily. 15.8 mL Arletta Lumadue, Jodi R, NP      PDMP not reviewed this encounter.   [name], NP 06/08/23 1001    [name], NP 06/08/23 1002

## 2023-06-08 NOTE — ED Triage Notes (Signed)
 Pt presents to uc with co nasal congestion and sob, ha, facial congestion and pain since Tuesday. Pt has taken multiple otc sinus meds with minimal improvement.

## 2023-06-08 NOTE — Discharge Instructions (Addendum)
 You have tested negative for COVID and flu. I have refilled your albuterol  inhaler to use for wheezing or shortness of breath. Promethazine  as needed for cough. Please note this medication will make you drowsy. Do not drink alcohol or drive while taking this medication. Flonase  and prednisone  daily. Lots of rest and fluids. Please ofllow up with your PCP if your symptoms do not improve. Please go to the ER for any worsening symptoms. I hope you feel better soon!

## 2023-06-12 ENCOUNTER — Ambulatory Visit

## 2023-06-13 ENCOUNTER — Ambulatory Visit: Admitting: Audiologist

## 2023-06-13 ENCOUNTER — Emergency Department (HOSPITAL_BASED_OUTPATIENT_CLINIC_OR_DEPARTMENT_OTHER): Admitting: Radiology

## 2023-06-13 ENCOUNTER — Emergency Department (HOSPITAL_BASED_OUTPATIENT_CLINIC_OR_DEPARTMENT_OTHER)
Admission: EM | Admit: 2023-06-13 | Discharge: 2023-06-13 | Disposition: A | Discharge status: Home / Self Care | Attending: Emergency Medicine | Admitting: Emergency Medicine

## 2023-06-13 ENCOUNTER — Encounter (HOSPITAL_BASED_OUTPATIENT_CLINIC_OR_DEPARTMENT_OTHER): Payer: Self-pay

## 2023-06-13 DIAGNOSIS — J45909 Unspecified asthma, uncomplicated: Secondary | ICD-10-CM | POA: Insufficient documentation

## 2023-06-13 DIAGNOSIS — J449 Chronic obstructive pulmonary disease, unspecified: Secondary | ICD-10-CM | POA: Diagnosis not present

## 2023-06-13 DIAGNOSIS — R0602 Shortness of breath: Secondary | ICD-10-CM | POA: Diagnosis not present

## 2023-06-13 DIAGNOSIS — Z7951 Long term (current) use of inhaled steroids: Secondary | ICD-10-CM | POA: Diagnosis not present

## 2023-06-13 DIAGNOSIS — I10 Essential (primary) hypertension: Secondary | ICD-10-CM | POA: Insufficient documentation

## 2023-06-13 DIAGNOSIS — R079 Chest pain, unspecified: Secondary | ICD-10-CM | POA: Insufficient documentation

## 2023-06-13 DIAGNOSIS — Z79899 Other long term (current) drug therapy: Secondary | ICD-10-CM | POA: Diagnosis not present

## 2023-06-13 LAB — BASIC METABOLIC PANEL WITH GFR
Anion gap: 12 (ref 5–15)
BUN: 15 mg/dL (ref 6–20)
CO2: 23 mmol/L (ref 22–32)
Calcium: 10 mg/dL (ref 8.9–10.3)
Chloride: 103 mmol/L (ref 98–111)
Creatinine, Ser: 0.71 mg/dL (ref 0.44–1.00)
GFR, Estimated: >60 mL/min (ref >60)
Glucose, Bld: 145 mg/dL — ABNORMAL HIGH (ref 70–99)
Potassium: 4.5 mmol/L (ref 3.5–5.1)
Sodium: 138 mmol/L (ref 135–145)

## 2023-06-13 LAB — CBC
HCT: 41.8 % (ref 36.0–46.0)
Hemoglobin: 13.7 g/dL (ref 12.0–15.0)
MCH: 26.7 pg (ref 26.0–34.0)
MCHC: 32.8 g/dL (ref 30.0–36.0)
MCV: 81.3 fL (ref 80.0–100.0)
Platelets: 256 10*3/uL (ref 150–400)
RBC: 5.14 MIL/uL — ABNORMAL HIGH (ref 3.87–5.11)
RDW: 12.9 % (ref 11.5–15.5)
WBC: 5.6 10*3/uL (ref 4.0–10.5)
nRBC: 0 % (ref 0.0–0.2)

## 2023-06-13 LAB — D-DIMER, QUANTITATIVE: D-Dimer, Quant: <0.27 ug{FEU}/mL (ref 0.00–0.50)

## 2023-06-13 LAB — TROPONIN T, HIGH SENSITIVITY
Troponin T High Sensitivity: <15 ng/L (ref <19)
Troponin T High Sensitivity: <15 ng/L (ref <19)

## 2023-06-13 LAB — PRO BRAIN NATRIURETIC PEPTIDE: Pro Brain Natriuretic Peptide: <36 pg/mL (ref <300.0)

## 2023-06-13 MED ORDER — PROCHLORPERAZINE EDISYLATE 10 MG/2ML IJ SOLN
10.0000 mg | Freq: Once | INTRAMUSCULAR | Status: AC
  Administered 2023-06-13: 10 mg via INTRAVENOUS
  Filled 2023-06-13: qty 2

## 2023-06-13 MED ORDER — DIPHENHYDRAMINE HCL 50 MG/ML IJ SOLN
25.0000 mg | Freq: Once | INTRAMUSCULAR | Status: AC
  Administered 2023-06-13: 25 mg via INTRAVENOUS
  Filled 2023-06-13: qty 1

## 2023-06-13 NOTE — ED Triage Notes (Signed)
 Pt states that she was woke up from her sleep with CP, SOB and nausea, radiation to jaw

## 2023-06-13 NOTE — ED Provider Notes (Signed)
 [place] [facility] AT [facility] Provider Note   CSN: [id number] Arrival date & time: 06/13/23  7829     History  Chief Complaint  Patient presents with   Chest Pain    [name] is a 53 y.o. female.  This is a 53 year old woman with history of IBS, hyperlipidemia, obesity, GERD, OSA, and hypertension who is presenting with 1 day history of chest pain.  He states that they woke up this morning with acute onset sided chest pain.  Since then the chest pain has been waxing and waning, they say that it may be old but worse with exertion and endorse shortness of breath with exertion.  Breathing exercises and rest help the pain go away.  They deny pleuritic chest pain, positional chest pain.  They do have a history of asthma for which they take an inhaler, and they were recently seen in the ED about 1 week ago with upper respiratory symptoms and were discharged with over-the-counter medications and prednisone  taper.   She has had workup for chest pain in the past including cardiac monitoring and coronary CT which were both relatively unrevealing.   The history is provided by the patient.  Chest Pain Associated symptoms: shortness of breath   Associated symptoms: no cough and no fever     Home Medications Prior to Admission medications   Medication Sig Start Date End Date Taking? Authorizing Provider  albuterol  (VENTOLIN  HFA) 108 (90 Base) MCG/ACT inhaler Inhale 1-2 puffs into the lungs every 6 (six) hours as needed. 06/08/23   Mayer, Jodi R, NP  amLODipine  (NORVASC ) 2.5 MG tablet Take 1 tablet (2.5 mg total) by mouth daily. 05/22/23 05/21/24  Cleave Curling, MD  butalbital -acetaminophen -caffeine  (FIORICET) 50-325-40 MG tablet One tab po q8h prn headache 06/14/22   Cleave Curling, MD  cetirizine  (ZYRTEC  ALLERGY ) 10 MG tablet Take 1 tablet (10 mg total) by mouth daily. 05/07/22   Adolph Hoop, [name]  cyclobenzaprine  (FLEXERIL ) 5 MG tablet Take 1 tablet (5 mg total)  by mouth 3 (three) times daily as needed for muscle spasms. 01/11/23   Cleave Curling, MD  fluticasone  (FLONASE ) 50 MCG/ACT nasal spray Place 1 spray into both nostrils daily. 06/08/23   Mayer, Jodi R, NP  hydrOXYzine  (ATARAX ) 25 MG tablet ONE TAB PO BID PRN 03/13/22   Cleave Curling, MD  ketoconazole  (NIZORAL ) 2 % cream Apply 1 Application topically daily. 05/07/22   Adolph Hoop, [name]  nystatin  powder Apply 1 Application topically 3 (three) times daily. 03/13/22   Cleave Curling, MD  omeprazole  (PRILOSEC) 40 MG capsule Take 1 capsule (40 mg total) by mouth daily. Take 30 minutes prior to breakfast. 10/04/22   Sean Czar, MD  predniSONE  (DELTASONE ) 20 MG tablet Take 2 tablets (40 mg total) by mouth daily with breakfast for 5 days. 06/08/23 06/13/23  Mayer, Jodi R, NP  promethazine -dextromethorphan (PROMETHAZINE -DM) 6.25-15 MG/5ML syrup Take 5 mLs by mouth 4 (four) times daily as needed for cough. 06/08/23   Mayer, Jodi R, NP  triamcinolone  cream (KENALOG ) 0.1 % APPLY TO AFFECTED AREA TWICE DAILY AS NEEDED 05/22/23   Cleave Curling, MD      Allergies    Amoxicillin, Sulfa antibiotics, Penicillins, Tape, and Tramadol    Review of Systems   Review of Systems  Constitutional:  Negative for chills and fever.  HENT:  Positive for congestion and ear pain.        Cerumen obstructing view of the left TM, right tympanic membrane visualized without  abnormality.  Respiratory:  Positive for shortness of breath. Negative for cough and wheezing.   Cardiovascular:  Positive for chest pain.    Physical Exam Updated Vital Signs BP (!) 145/86   Pulse 69   Temp (!) 97.5 F (36.4 C) (Oral)   Resp 17   LMP 11/05/2015 Comment: tubal ligation  SpO2 98%  Physical Exam Constitutional:      Appearance: She is well-developed.  Cardiovascular:     Rate and Rhythm: Normal rate and regular rhythm.     Comments: No murmur appreciated Pulmonary:     Effort: Pulmonary effort is normal.     Breath sounds: Normal  breath sounds.     Comments: No wheezing. Chest:     Chest wall: Tenderness present.     Comments: Slight tenderness palpation of the left anterior chest wall. Abdominal:     Tenderness: There is no abdominal tenderness. There is no guarding or rebound.  Musculoskeletal:     Comments: Trace lower extremity edema  Neurological:     Mental Status: She is alert.     ED Results / Procedures / Treatments   Labs (all labs ordered are listed, but only abnormal results are displayed) Labs Reviewed  BASIC METABOLIC PANEL WITH GFR - Abnormal; Notable for the following components:      Result Value   Glucose, Bld 145 (*)    All other components within normal limits  CBC - Abnormal; Notable for the following components:   RBC 5.14 (*)    All other components within normal limits  D-DIMER, QUANTITATIVE  PRO BRAIN NATRIURETIC PEPTIDE  TROPONIN T, HIGH SENSITIVITY  TROPONIN T, HIGH SENSITIVITY  TROPONIN T, HIGH SENSITIVITY  TROPONIN T, HIGH SENSITIVITY    EKG EKG Interpretation Date/Time:  Wednesday June 13 2023 02:43:40 EDT Ventricular Rate:  85 PR Interval:  148 QRS Duration:  68 QT Interval:  360 QTC Calculation: 428 R Axis:   5  Text Interpretation: Normal sinus rhythm Normal ECG When compared with ECG of 13-Dec-2021 10:20, PREVIOUS ECG IS PRESENT Confirmed by Rosealee Concha ([id number]) on 06/13/2023 6:38:16 AM  Radiology DG Chest 2 View Result Date: 06/13/2023 CLINICAL DATA:  Chest pain and shortness of breath with nausea. EXAM: CHEST - 2 VIEW COMPARISON:  May 22, 2011 FINDINGS: The heart size and mediastinal contours are within normal limits. Both lungs are clear. Radiopaque surgical clips are seen within the right upper quadrant. The visualized skeletal structures are unremarkable. IMPRESSION: No active cardiopulmonary disease. Electronically Signed   By: Virgle Grime M.D.   On: 06/13/2023 03:33    Procedures Procedures    Medications Ordered in ED Medications   prochlorperazine  (COMPAZINE ) injection 10 mg (10 mg Intravenous Given 06/13/23 0813)  diphenhydrAMINE  (BENADRYL ) injection 25 mg (25 mg Intravenous Given 06/13/23 0813)    ED Course/ Medical Decision Making/ A&P                                 Medical Decision Making Amount and/or Complexity of Data Reviewed External Data Reviewed: radiology.    Details: Previous coronary CT reviewed, no significant CAD seen. Labs: ordered. Radiology: ordered. ECG/medicine tests: ordered.  Risk Prescription drug management.   This is a 53 year old female with history of hypertension, hyperlipidemia, asthma, and COPD who was recently seen in the ED for viral URI symptoms.  She is now presenting with chest pain which began earlier this morning.  EKG, chest  x-ray, troponins all independently reviewed and without significant abnormality.  She did have a coronary CT in 2023 which did not show significant coronary artery disease which is also reassuring.  Differential diagnosis would include ACS, PE, pneumonia, dissection, or costochondritis.  Unlikely to represent ACS given negative troponins, no known CAD.  No significant tachycardia or hypoxia to raise concern for large pulmonary embolism.  She does have recent viral URI though lungs are clear to auscultation chest x-ray is negative for opacity lowering suspicion for possible pneumonia.  She does have some tenderness to palpation of the chest and endorses recent coughing which may be in line with costochondritis.  Given that her symptoms began at about 1 AM, under first troponin was drawn about 2AM we will go ahead and repeat troponins.  Will also collect a D-dimer and BNP given her lower extremity swelling shortness of breath/recent travel.  She was given headache cocktail for her ongoing left-sided headache.  On reevaluation, patient says she is feeling better after headache cocktail.  She states that she has not been taking her omeprazole  in retrospect.   Her troponins, D-dimer, BNP were all negative.  Given her overall negative workup low concern for emergent pathology.  She will be discharged home with PCP follow-up, patient is amenable with plan.        Final Clinical Impression(s) / ED Diagnoses Final diagnoses:  Chest pain, unspecified type    Rx / DC Orders ED Discharge Orders     None         Sheree Dieter, MD 06/13/23 8469    Scarlette Currier, MD 06/14/23 0005

## 2023-06-13 NOTE — Discharge Instructions (Addendum)
 You were seen today for chest pain, your lab test showed no evidence of heart attack, blood clots, or lung infection.   Please continue to take your omeprazole  which may help reduce stomach acid in case your pain is coming from your stomach or esophagus.  I would also recommend that you continue to take your amlodipine  and follow-up with your primary care doctor regarding your blood pressure. Please follow-up with your primary care doctor within 1 week for further evaluation.

## 2023-06-19 ENCOUNTER — Telehealth: Payer: Self-pay | Admitting: Pharmacist

## 2023-06-19 DIAGNOSIS — E782 Mixed hyperlipidemia: Secondary | ICD-10-CM

## 2023-06-19 NOTE — Progress Notes (Signed)
   06/19/2023  Patient ID: [name], female   DOB: 17-Dec-1970, 53 y.o.   MRN: [id number]  Called patient regarding Repatha  re-start. Unfortunately, she did not answer her phone. HIPAA compliant message was left on her voicemail. Today's call was the 3rd unsuccessful phone call.  Plan: Call patient back in 2 weeks for final call     Geronimo Krabbe, PharmD, [facility] Clinical Pharmacist [phone]

## 2023-06-25 ENCOUNTER — Telehealth: Payer: Self-pay | Admitting: Pharmacist

## 2023-06-25 DIAGNOSIS — E782 Mixed hyperlipidemia: Secondary | ICD-10-CM

## 2023-06-25 MED ORDER — ATORVASTATIN CALCIUM 40 MG PO TABS: ORAL_TABLET | ORAL | Status: DC

## 2023-06-25 NOTE — Progress Notes (Signed)
 06/25/2023 Name: [name] MRN: [id number] DOB: 1971/01/09  Chief Complaint  Patient presents with   Medication Management    Repatha  Restart-Lipids    [name] is a 53 y.o. year old female who presented for a telephone visit.   They were referred to the pharmacist by their PCP for assistance in managing hyperlipidemia.    Subjective: Patient was referred for Repatha  (evolocumab ) restart due to persistently elevated LDL, most recently 142 mg/dL. She has a history of statin-associated myopathy with both atorvastatin  and rosuvastatin. Although previously prescribed Repatha  last year, the patient never initiated therapy.  She has a past medical history significant for:  hypertension, hyperlipidemia, prediabetes, and migraines.  Patient recently visited the ED for chest pain. She had negative troponin's, had a recent respiratory infection, and had been off of her PPI--chest pin was assumed to me non-cardiac in nature.  Care Team: Primary Care Provider: Cleave Curling, MD ; Next Scheduled Visit: 11/28/2023 Weight Management- Dr. Allie Area 06/27/2023  Medication Access/Adherence  Current Pharmacy:  [id number] - [place] - [address] [place] ([place] [address] Phone: [phone] Fax: [phone]  [id number] - [place], [place] - [address] AT [facility] OF [address] [place]  [address] Phone: [phone] Fax: [phone]   Patient reports affordability concerns with their medications: No  Patient reports access/transportation concerns to their pharmacy: No  Patient reports adherence concerns with their medications:  Yes  Did not want to inject Repatha    Hypertension:  Current medications:   Amlodipine  2.5 mg daily  Hyperlipidemia/ASCVD Risk Reduction  Current lipid lowering medications:  Currently not on therapy- has been on  Atorvastatin , Rosuvastatin and Red Brewer's Yeast in the past. Repatha  was started last year but the Patient did not actually start therapy.   Current physical activity: Patient reported walking twice per day.  She is striving to increase stamina back to being able to walk for 1 hour per day.   Clinical ASCVD: No  The 10-year ASCVD risk score (Arnett DK, et al., 2019) is: 9.6%   Values used to calculate the score:     Age: 38 years     Sex: Female     Is Non-Hispanic African American: Yes     Diabetic: No     Tobacco smoker: No     Systolic Blood Pressure: 145 mmHg     Is BP treated: Yes     HDL Cholesterol: 43 mg/dL     Total Cholesterol: 209 mg/dL     Objective:  Lab Results  Component Value Date   HGBA1C 6.2 (H) 05/22/2023    Lab Results  Component Value Date   CREATININE 0.71 06/13/2023   BUN 15 06/13/2023   NA 138 06/13/2023   K 4.5 06/13/2023   CL 103 06/13/2023   CO2 23 06/13/2023    Lab Results  Component Value Date   CHOL 209 (H) 05/22/2023   HDL 43 05/22/2023   LDLCALC 142 (H) 05/22/2023   TRIG 132 05/22/2023   CHOLHDL 4.9 (H) 05/22/2023    Medications Reviewed Today     Reviewed by Geronimo Krabbe, RPH (Pharmacist) on 06/25/23 at 1146  Med List Status: <None>   Medication Order Taking? Sig Documenting Provider Last Dose Status Informant  albuterol  (VENTOLIN  HFA) 108 (90 Base) MCG/ACT inhaler [id number] Yes Inhale 1-2 puffs into the lungs every 6 (six) hours as needed.  Alleen Arbour, NP Taking Active   amLODipine  (NORVASC ) 2.5 MG tablet [id number] Yes Take 1 tablet (2.5 mg total) by mouth daily. Cleave Curling, MD Taking Active   butalbital -acetaminophen -caffeine  (FIORICET) 50-325-40 MG tablet [id number] Yes One tab po q8h prn headache Cleave Curling, MD Taking Active   cetirizine  (ZYRTEC  ALLERGY ) 10 MG tablet [id number] Yes Take 1 tablet (10 mg total) by mouth daily. Adolph Hoop, [name] Taking Active   cyclobenzaprine  (FLEXERIL ) 5 MG tablet [id number] Yes  Take 1 tablet (5 mg total) by mouth 3 (three) times daily as needed for muscle spasms. Cleave Curling, MD Taking Active            Med Note Georganna Kin Jun 25, 2023 11:44 AM) AS NEEDED  fluticasone  (FLONASE ) 50 MCG/ACT nasal spray [id number] Yes Place 1 spray into both nostrils daily. Alleen Arbour, NP Taking Active   ketoconazole  (NIZORAL ) 2 % cream [id number] Yes Apply 1 Application topically daily. Adolph Hoop, [name] Taking Active   linaclotide Glory Larsen) 290 MCG CAPS capsule [id number] Yes 1 cap(s) orally 30 minutes before the first meal of the day for 90 days [provider] Taking Active   nystatin  powder [id number] Yes Apply 1 Application topically 3 (three) times daily. Cleave Curling, MD Taking Active   omeprazole  [facility]) 40 MG capsule [id number] Yes Take 1 capsule (40 mg total) by mouth daily. Take 30 minutes prior to breakfast. Sean Czar, MD Taking Active   promethazine -dextromethorphan (PROMETHAZINE -DM) 6.25-15 MG/5ML syrup [id number] No Take 5 mLs by mouth 4 (four) times daily as needed for cough.  Patient not taking: Reported on 06/25/2023   Alleen Arbour, NP Not Taking Active   triamcinolone  cream (KENALOG ) 0.1 % [id number] No APPLY TO AFFECTED AREA TWICE DAILY AS NEEDED  Patient not taking: Reported on 06/25/2023   Cleave Curling, MD Not Taking Active               Assessment/Plan:   Hypertension: - Currently uncontrolled 145/86 - Reviewed long term cardiovascular and renal outcomes of uncontrolled blood pressure - Reviewed taking amlodipine  as prescribed- patient admitted to taking amlodipine  "sometimes" due to experiencing headaches. We discussed the mechanism of action of amlodipine , specifically its vasodilatory effect and the potential role that plays in causing headaches. The patient was educated that side effects such as headaches may improve with consistent use and was advised to  - Recommend to take amlodipine  at night as prescribed by her provider  for at least two weeks.  (We can reassess at that time. If headaches are still occurring, she can be switched to another medication)      Hyperlipidemia/ASCVD Risk Reduction: - Currently uncontrolled. LDL 142 mg/dl - Reviewed long term complications of uncontrolled cholesterol - Reviewed dietary recommendations including Limited meat but getting protein through beans, nuts, and proten shakes, continuigin a low carb diet - Reviewed lifestyle recommendations including continue walking and get up to her goal of walking 1 hour per day 5 days per week.  -  Patient expressed ongoing hesitation about starting Repatha , citing concerns about self-injection and adherence to twice-monthly dosing. She saw a Pharmacist in the Cardiology clinic last year about Repatha  and was given a sample that she did not use. We reviewed alternative lipid-lowering strategies, including evidence supporting low-dose, intermittent statin regimens which may reduce the risk of myopathy.   -  Patient agreed to re-trial atorvastatin  at a reduced frequency of three times per week. We also discussed emerging evidence suggesting that  once-weekly rosuvastatin 5 mg may be an effective alternative if needed in the future. We also discussed revisiting Repatha  if the statin strategies are unsuccessful.  A1c- currently 6.2-Prediabetes.  Spoke to Patient about her A1c. She said she would continue to work on it through diet and exercise.  She will be seeing Weight Management later this week.  GLP-1 therapy may be a good option for her.  Follow Up Plan:    Send Patient some information on Repatha  Follow up with her in two weeks to discuss atorvastatin  and amlodipine  tolerance and diet and exercise goals.   Geronimo Krabbe, PharmD, BCACP Clinical Pharmacist [phone]

## 2023-06-27 ENCOUNTER — Ambulatory Visit (INDEPENDENT_AMBULATORY_CARE_PROVIDER_SITE_OTHER): Admitting: Internal Medicine

## 2023-06-27 ENCOUNTER — Encounter (INDEPENDENT_AMBULATORY_CARE_PROVIDER_SITE_OTHER): Payer: Self-pay | Admitting: Internal Medicine

## 2023-06-27 VITALS — BP 138/87 | HR 81 | Temp 98.0°F | Ht 69.0 in | Wt 272.0 lb

## 2023-06-27 DIAGNOSIS — I10 Essential (primary) hypertension: Secondary | ICD-10-CM | POA: Diagnosis not present

## 2023-06-27 DIAGNOSIS — K760 Fatty (change of) liver, not elsewhere classified: Secondary | ICD-10-CM

## 2023-06-27 DIAGNOSIS — E66813 Obesity, class 3: Secondary | ICD-10-CM

## 2023-06-27 DIAGNOSIS — E7800 Pure hypercholesterolemia, unspecified: Secondary | ICD-10-CM | POA: Diagnosis not present

## 2023-06-27 DIAGNOSIS — G4733 Obstructive sleep apnea (adult) (pediatric): Secondary | ICD-10-CM | POA: Diagnosis not present

## 2023-06-27 DIAGNOSIS — R7303 Prediabetes: Secondary | ICD-10-CM

## 2023-06-27 DIAGNOSIS — Z6841 Body Mass Index (BMI) 40.0 and over, adult: Secondary | ICD-10-CM

## 2023-06-27 DIAGNOSIS — J4520 Mild intermittent asthma, uncomplicated: Secondary | ICD-10-CM

## 2023-06-27 DIAGNOSIS — E669 Obesity, unspecified: Secondary | ICD-10-CM | POA: Insufficient documentation

## 2023-06-27 NOTE — Assessment & Plan Note (Signed)
 Obesity increases the risk of reactive airway disease.  She reports having mild asthma triggered by upper respiratory tract infections.  Losing 10 to 15% of body weight may improve asthma control.

## 2023-06-27 NOTE — Assessment & Plan Note (Signed)
 Contributing factors: Genetics, decreased volume of physical activity, OSA, menopause, chronic skipping of meals,.  We reviewed anthropometrics, biometrics, associated medical conditions and contributing factors with patient. she would benefit from a medically tailored reduced calorie nutrional plan based on her REE (resting energy expenditure), which will be determined by indirect calorimetry.  We will also assess for cardiometabolic risk and nutritional derangements via fasting labs at intake appointment.

## 2023-06-27 NOTE — Assessment & Plan Note (Signed)
 On CPAP with reported good compliance. Continue PAP therapy. Losing 15% or more of body weight may improve AHI.  We discussed how sleep apnea may affect weight loss due to disruption of energy regulation system.

## 2023-06-27 NOTE — Assessment & Plan Note (Signed)
 She has hepatic steatosis on ultrasound of the liver from 2010.  Most recent liver enzymes and platelet counts are normal.  Losing 15% of body weight may improve condition also reducing saturated fats simple and added sugar in diet.  Pharmacotherapy may also be of benefit

## 2023-06-27 NOTE — Assessment & Plan Note (Signed)
  Hypertension is newly diagnosed, likely related to recent weight gain. Blood pressure readings have been elevated, with a recent reading of 138/87 mmHg. The goal is to achieve less than 130/80 mmHg to reduce cardiovascular risk. She is currently on amlodipine  2.5 mg but reports feeling unwell and has not been taking it consistently. - Instruct to monitor blood pressure at home, checking in the morning and before bedtime, three times a week. - Advise to take amlodipine  at night to assess tolerance and effectiveness. - Discuss the importance of controlling blood pressure to prevent heart failure, kidney disease, and vascular complications.

## 2023-06-27 NOTE — Assessment & Plan Note (Signed)
 LDL is not at goal. Elevated LDL may be secondary to nutrition, genetics and spillover effect from excess adiposity. Recommended LDL goal is <70 to reduce the risk of fatty streaks and the progression to obstructive ASCVD in the future.   Her 10 year risk is: The 10-year ASCVD risk score (Arnett DK, et al., 2019) is: 8%  Lab Results  Component Value Date   CHOL 209 (H) 05/22/2023   HDL 43 05/22/2023   LDLCALC 142 (H) 05/22/2023   TRIG 132 05/22/2023   CHOLHDL 4.9 (H) 05/22/2023   She is on atorvastatin  3 times a week.  She had a coronary artery calcium  score that was 0 but there was evidence of aortic atherosclerosis.  Continue weight loss therapy, losing 10% or more of body weight may improve condition.  Patient will receive additional nutritional counseling at her intake appointment.

## 2023-06-27 NOTE — Progress Notes (Signed)
 Office: [phone]  /  Fax: [phone]   Initial Visit  [name] was seen in clinic today to evaluate for obesity. She is interested in losing weight to improve overall health and reduce the risk of weight related complications. She presents today to review program treatment options, initial physical assessment, and evaluation.      Discussed the use of AI scribe software for clinical note transcription with the patient, who gave verbal consent to proceed.  History of Present Illness   [name] "Brian Campanile" is a 53 year old female with obesity, hypertension, and sleep apnea who presents for medical weight management consultation.  She has experienced a weight gain of approximately 30 pounds over the past six months, attributing it to grief from her father's passing, menopause, and emotional eating. She has a history of successful weight loss, having lost 85 pounds previously through a low glycemic diet and exercising twice daily. Currently, she identifies as an emotional eater, particularly during times of stress, with dietary triggers including sweets and processed foods.  She has hypertension, which she believes is related to her recent weight gain. She is prescribed amlodipine  2.5 mg but does not take it consistently due to side effects. Her blood pressure has been elevated recently, with readings around 138/87 mmHg.  She has sleep apnea and uses a CPAP machine regularly. The condition developed in the last two years. She also has mild asthma, typically triggered by upper respiratory infections, and uses her CPAP machine consistently.  Her medical history includes prediabetes and high cholesterol. She is working on Environmental manager choices to manage these conditions. Her family history includes obesity, with her sister and brother being overweight, and her mother having been overweight before her illness. Her mother also had pancreatic cancer and was diabetic.  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 She typically eats two meals a day and sometimes snacks at night, especially if she stays up late. No significant impact of weight on mood, self-esteem, or energy levels, but she notes decreased mobility and shortness of breath with exertion.        She was referred by: PCP  When asked what else they would like to accomplish? She states: Adopt healthier eating patterns, Improve energy levels and physical activity, Improve existing medical conditions, and Improve quality of life  When asked how has your weight affected you? She states: Contributed to medical problems, Having fatigue, and Having poor endurance  Weight history: See above  Some associated conditions: Hypertension, Hyperlipidemia, OSA, and Prediabetes  Contributing factors: Family history of obesity, Disruption of circadian rhythm / sleep disordered breathing, Consumption of processed foods, Reduced physical activity, Menopause, and Enticing relationships and enviroment  Weight promoting medications identified: None  Current nutrition plan: None  Current level of physical activity: Low levels of physical activity at present  Current or previous pharmacotherapy: None  Response to medication: Never tried medications   Past medical history includes:   Past Medical History:  Diagnosis Date   Abdominal pain    Allergy     Allergy -induced asthma    Angina 05/2011   NO HEART PROBLEM WENT TO ED 05/22/11 FOR CP STRESS TEST WAS NEGATIVE    Ankle fracture    Aortic atherosclerosis (HCC) 09/01/2021   Chronic back pain greater than 3 months duration 01/17/2011   injured lumbar and posterior cervical neck in fall    Constipation  DDD (degenerative disc disease), cervical    Diverticulitis    Gallbladder problem    GERD (gastroesophageal reflux disease)    H/O hiatal hernia    Hyperlipidemia     IBS (irritable bowel syndrome)    Nausea    Neuromuscular disorder (HCC)    LUMBA BACK  TROUBLE    Pre-diabetes    Rash    UNDER BIL BREASTS   Wrist fracture      Objective:   BP 138/87   Pulse 81   Temp 98 F (36.7 C)   Ht 5\' 9"  (1.753 m)   Wt 272 lb (123.4 kg)   LMP 11/05/2015 Comment: tubal ligation  SpO2 94%   BMI 40.17 kg/m  She was weighed on the bioimpedance scale: Body mass index is 40.17 kg/m.  Peak Weight: 285, Body Fat%: 47, Visceral Fat Rating: 15, Weight trend over the last 12 months: Decreasing  General:  Alert, oriented and cooperative. Patient is in no acute distress.  Respiratory: Normal respiratory effort, no problems with respiration noted   Gait: able to ambulate independently  Mental Status: Normal mood and affect. Normal behavior. Normal judgment and thought content.   DIAGNOSTIC DATA REVIEWED:  BMET    Component Value Date/Time   NA 138 06/13/2023 0249   NA 143 05/22/2023 0926   K 4.5 06/13/2023 0249   CL 103 06/13/2023 0249   CO2 23 06/13/2023 0249   GLUCOSE 145 (H) 06/13/2023 0249   BUN 15 06/13/2023 0249   BUN 10 05/22/2023 0926   CREATININE 0.71 06/13/2023 0249   CALCIUM  10.0 06/13/2023 0249   GFRNONAA >60 06/13/2023 0249   GFRAA 108 06/30/2020 0000   Lab Results  Component Value Date   HGBA1C 6.2 (H) 05/22/2023   HGBA1C 5.7 (H) 12/17/2017   Lab Results  Component Value Date   INSULIN  9.2 11/03/2021   INSULIN  7.8 12/17/2017   CBC    Component Value Date/Time   WBC 5.6 06/13/2023 0249   RBC 5.14 (H) 06/13/2023 0249   HGB 13.7 06/13/2023 0249   HGB 13.6 11/20/2022 1011   HCT 41.8 06/13/2023 0249   HCT 42.7 11/20/2022 1011   PLT 256 06/13/2023 0249   PLT 213 11/20/2022 1011   MCV 81.3 06/13/2023 0249   MCV 83 11/20/2022 1011   MCH 26.7 06/13/2023 0249   MCHC 32.8 06/13/2023 0249   RDW 12.9 06/13/2023 0249   RDW 12.6 11/20/2022 1011   Iron/TIBC/Ferritin/ %Sat    Component Value Date/Time   IRON 66 09/30/2019 0000    FERRITIN 91 09/30/2019 0000   Lipid Panel     Component Value Date/Time   CHOL 209 (H) 05/22/2023 0926   TRIG 132 05/22/2023 0926   HDL 43 05/22/2023 0926   CHOLHDL 4.9 (H) 05/22/2023 0926   LDLCALC 142 (H) 05/22/2023 0926   Hepatic Function Panel     Component Value Date/Time   PROT 6.9 05/22/2023 0926   ALBUMIN 4.4 05/22/2023 0926   AST 20 05/22/2023 0926   ALT 18 05/22/2023 0926   ALKPHOS 78 05/22/2023 0926   BILITOT 0.2 05/22/2023 0926   BILIDIR 0.1 06/30/2020 0000   BILIDIR 0.10 10/16/2019 0930      Component Value Date/Time   TSH 1.480 11/20/2022 1011     Assessment and Plan:   Primary hypertension Assessment & Plan:  Hypertension is newly diagnosed, likely related to recent weight gain. Blood pressure readings have been elevated, with a recent reading of 138/87 mmHg. The goal  is to achieve less than 130/80 mmHg to reduce cardiovascular risk. She is currently on amlodipine  2.5 mg but reports feeling unwell and has not been taking it consistently. - Instruct to monitor blood pressure at home, checking in the morning and before bedtime, three times a week. - Advise to take amlodipine  at night to assess tolerance and effectiveness. - Discuss the importance of controlling blood pressure to prevent heart failure, kidney disease, and vascular complications.           OSA on CPAP Assessment & Plan: On CPAP with reported good compliance. Continue PAP therapy. Losing 15% or more of body weight may improve AHI.  We discussed how sleep apnea may affect weight loss due to disruption of energy regulation system.    Mild intermittent asthma without complication Assessment & Plan: Obesity increases the risk of reactive airway disease.  She reports having mild asthma triggered by upper respiratory tract infections.  Losing 10 to 15% of body weight may improve asthma control.   Pure hypercholesterolemia Assessment & Plan: LDL is not at goal. Elevated LDL may be  secondary to nutrition, genetics and spillover effect from excess adiposity. Recommended LDL goal is <70 to reduce the risk of fatty streaks and the progression to obstructive ASCVD in the future.   Her 10 year risk is: The 10-year ASCVD risk score (Arnett DK, et al., 2019) is: 8%  Lab Results  Component Value Date   CHOL 209 (H) 05/22/2023   HDL 43 05/22/2023   LDLCALC 142 (H) 05/22/2023   TRIG 132 05/22/2023   CHOLHDL 4.9 (H) 05/22/2023   She is on atorvastatin  3 times a week.  She had a coronary artery calcium  score that was 0 but there was evidence of aortic atherosclerosis.  Continue weight loss therapy, losing 10% or more of body weight may improve condition.  Patient will receive additional nutritional counseling at her intake appointment.      Prediabetes Assessment & Plan: Most recent A1c is  Lab Results  Component Value Date   HGBA1C 6.2 (H) 05/22/2023   HGBA1C 5.7 (H) 12/17/2017    Patient aware of disease state and risk of progression. This may contribute to abnormal cravings, fatigue and diabetic complications without having diabetes.   We have discussed treatment options which include: losing 7 to 10% of body weight, increasing physical activity to a goal of 150 minutes a week at moderate intensity.  She would benefit from a reduced calorie nutrition plan with a low glycemic load this will be implemented during her intake appointment.  In addition she may be a candidate for pharmacoprophylaxis.  Patient is not interested in antiobesity medications.    Metabolic dysfunction-associated steatotic liver disease (MASLD) Assessment & Plan: She has hepatic steatosis on ultrasound of the liver from 2010.  Most recent liver enzymes and platelet counts are normal.  Losing 15% of body weight may improve condition also reducing saturated fats simple and added sugar in diet.  Pharmacotherapy may also be of benefit   Class 3 severe obesity with serious comorbidity and body  mass index (BMI) of 40.0 to 44.9 in adult, unspecified obesity type Assessment & Plan: Contributing factors: Genetics, decreased volume of physical activity, OSA, menopause, chronic skipping of meals,.  We reviewed anthropometrics, biometrics, associated medical conditions and contributing factors with patient. she would benefit from a medically tailored reduced calorie nutrional plan based on her REE (resting energy expenditure), which will be determined by indirect calorimetry.  We will also assess for  cardiometabolic risk and nutritional derangements via fasting labs at intake appointment.               Obesity Treatment / Action Plan:  Patient will work on garnering support from family and friends to begin weight loss journey. Will work on eliminating or reducing the presence of highly palatable, calorie dense foods in the home. Will complete provided nutritional and psychosocial assessment questionnaire before the next appointment. Will be scheduled for indirect calorimetry to determine resting energy expenditure in a fasting state.  This will allow us  to create a reduced calorie, high-protein meal plan to promote loss of fat mass while preserving muscle mass. Counseled on the health benefits of losing 5%-15% of total body weight. Was counseled on nutritional approaches to weight loss and benefits of reducing processed foods and consuming plant-based foods and high quality protein as part of nutritional weight management. Was counseled on pharmacotherapy and role as an adjunct in weight management.   Obesity Education Performed Today:  She was weighed on the bioimpedance scale and results were discussed and documented in the synopsis.  We discussed obesity as a disease and the importance of a more detailed evaluation of all the factors contributing to the disease.  We discussed the importance of long term lifestyle changes which include nutrition, exercise and behavioral  modifications as well as the importance of customizing this to her specific health and social needs.  We discussed the benefits of reaching a healthier weight to alleviate the symptoms of existing conditions and reduce the risks of the biomechanical, metabolic and psychological effects of obesity.  Zhanae B Aken appears to be in the action stage of change and states they are ready to start intensive lifestyle modifications and behavioral modifications.  I have spent 44 minutes in the care of the patient today including: 2 minutes before the visit reviewing and prepping the chart 32 minutes face-to-face assessing and reviewing listed medical problems as outlined in obesity care plan, providing nutritional and behavioral counseling on topics outlined in the obesity care plan, independently interpreting test results and goals of care, as described in assessment and plan, and reviewing and discussing biometric information and progress 10 minutes after the visit updating chart and documentation  Reviewed by clinician on day of visit: allergies, medications, problem list, medical history, surgical history, family history, social history, and previous encounter notes pertinent to obesity diagnosis.   Ladd Picker, MD

## 2023-06-27 NOTE — Assessment & Plan Note (Signed)
 Most recent A1c is  Lab Results  Component Value Date   HGBA1C 6.2 (H) 05/22/2023   HGBA1C 5.7 (H) 12/17/2017    Patient aware of disease state and risk of progression. This may contribute to abnormal cravings, fatigue and diabetic complications without having diabetes.   We have discussed treatment options which include: losing 7 to 10% of body weight, increasing physical activity to a goal of 150 minutes a week at moderate intensity.  She would benefit from a reduced calorie nutrition plan with a low glycemic load this will be implemented during her intake appointment.  In addition she may be a candidate for pharmacoprophylaxis.  Patient is not interested in antiobesity medications.

## 2023-07-04 ENCOUNTER — Telehealth: Payer: Self-pay | Admitting: Pharmacist

## 2023-07-04 DIAGNOSIS — E7800 Pure hypercholesterolemia, unspecified: Secondary | ICD-10-CM

## 2023-07-04 NOTE — Progress Notes (Signed)
   07/04/2023 Name: [name] MRN: [id number] DOB: 10-Jul-1970  Chief Complaint  Patient presents with   Medication Management    Lipids    AISLA ROBLYER is a 53 y.o. year old female who presented for a telephone visit.   They were referred to the pharmacist by their PCP for assistance in managing hyperlipidemia.    Subjective: Patient is a 53 year old female with a past medical history significant for hypertension, hyperlipidemia, obesity, prediabetes, and sleep apnea.   Purpose of today's call was to follow up on her restarting amlodipine  and atorvastatin .  Care Team: Primary Care Provider: Cleave Curling, MD ; Next Scheduled Visit: 12/18/23   Medication Access/Adherence  Current Pharmacy:  [id number] - [place] ([place] - [address] [place] ([place] [address] Phone: [phone] Fax: [phone]  [id number] - [place], [place] - [address] AT [facility] [address] [place] [address] Phone: [phone] Fax: [phone]   Patient reports affordability concerns with their medications: No  Patient reports access/transportation concerns to their pharmacy: No  Patient reports adherence concerns with their medications:  Yes  Amlodipine  and Atorvastatin   Patient reported that she restarted amlodipine  at bedtime as instructed.  She said she had not started the Atorvastatin  three times weekly because she could not find her "stash" of it at home.  I offered to send in a new prescription but she asked me to hold off until she had an opportunity to look the atorvastatin  she has around her house.  She has to come to the clinic on Friday for a blood pressure check and said she would let the staff know on Friday if she could not find her Atorvastatin  tablets so a new prescription could be sent to her pharmacy.   Objective:  Lab Results  Component Value Date    HGBA1C 6.2 (H) 05/22/2023    Lab Results  Component Value Date   CREATININE 0.71 06/13/2023   BUN 15 06/13/2023   NA 138 06/13/2023   K 4.5 06/13/2023   CL 103 06/13/2023   CO2 23 06/13/2023    Lab Results  Component Value Date   CHOL 209 (H) 05/22/2023   HDL 43 05/22/2023   LDLCALC 142 (H) 05/22/2023   TRIG 132 05/22/2023   CHOLHDL 4.9 (H) 05/22/2023    Medications Reviewed Today   Medications were not reviewed in this encounter       Assessment/Plan:   Patient saw the [facility] weight loss [facility] after our telephone visit last week. She will continue to work on her weight loss efforts with their clinic. She reported increasing her walking to 30 minutes five days per week.  Follow Up Plan:   Follow up with Patient in a month to see how restarting the atorvastatin  is going.  Geronimo Krabbe, PharmD, BCACP Clinical Pharmacist [phone]

## 2023-07-06 ENCOUNTER — Other Ambulatory Visit: Payer: Self-pay

## 2023-07-06 ENCOUNTER — Ambulatory Visit

## 2023-07-06 VITALS — BP 130/88 | HR 71 | Temp 98.5°F | Ht 69.0 in | Wt 272.0 lb

## 2023-07-06 DIAGNOSIS — I10 Essential (primary) hypertension: Secondary | ICD-10-CM

## 2023-07-06 DIAGNOSIS — E782 Mixed hyperlipidemia: Secondary | ICD-10-CM

## 2023-07-06 MED ORDER — ATORVASTATIN CALCIUM 40 MG PO TABS: ORAL_TABLET | ORAL | 1 refills | Status: DC

## 2023-07-06 NOTE — Progress Notes (Signed)
 Patient presents today for a BP check, patient currently taking amLODipine  2.5mg  PM.  BP Readings from Last 3 Encounters:  07/06/23 130/88  06/27/23 138/87  06/13/23 (!) 145/86   PER PROVIDER- goal is to be less than 120/80- f/u 8 week BP check.

## 2023-07-10 DIAGNOSIS — Z0289 Encounter for other administrative examinations: Secondary | ICD-10-CM

## 2023-07-24 ENCOUNTER — Telehealth: Payer: Self-pay | Admitting: Cardiovascular Disease

## 2023-07-24 NOTE — Telephone Encounter (Signed)
 Called and left voice message to call back

## 2023-07-24 NOTE — Telephone Encounter (Signed)
 Pt c/o swelling/edema: STAT if pt has developed SOB within 24 hours  If swelling, where is the swelling located? Legs  How much weight have you gained and in what time span? Yes but unsure of the amount  Have you gained 2 pounds in a day or 5 pounds in a week? No  Do you have a log of your daily weights (if so, list)? No  Are you currently taking a fluid pill? No  Are you currently SOB? Not at the moment   Have you traveled recently in a car or plane for an extended period of time?

## 2023-07-26 ENCOUNTER — Telehealth: Payer: Self-pay | Admitting: Pharmacist

## 2023-07-26 DIAGNOSIS — E7800 Pure hypercholesterolemia, unspecified: Secondary | ICD-10-CM

## 2023-07-26 NOTE — Progress Notes (Signed)
   07/26/2023  Patient ID: [name], female   DOB: 1970-04-14, 53 y.o.   MRN: [id number]   Patient was called to follow up on Atorvastatin  40 mg Take 1 tablet three times weekly as she recently restarted statin therapy.  Unfortunately, she did not answer the phone. HIPAA compliant message was left on her voicemail requesting a call back.  Plan: Call patient back in 2-3 weeks.  Geronimo Krabbe, PharmD, BCACP Clinical Pharmacist [phone]

## 2023-07-26 NOTE — Telephone Encounter (Signed)
 Patient returned call- transferred from call center.   Patient states she is on amlodipine  2.5mg  daily- started 4/3- started it and it made her dizzy at first. She started taking it consistently about two weeks- she noticed that her ankles are very swollen. Swelling is worse throughout the day but gets better at night when she props them up. She also endorses being on vacation, eating more salt and being up on her feet more. Advised we typically only see swelling due to high doses. Advised pt to get back on normal routine for a few weeks and see if it gets better- if not call us  back.

## 2023-08-08 ENCOUNTER — Ambulatory Visit: Payer: BC Managed Care – PPO | Admitting: Family Medicine

## 2023-08-15 ENCOUNTER — Encounter: Payer: Self-pay | Admitting: Pharmacist

## 2023-08-15 ENCOUNTER — Telehealth: Payer: Self-pay | Admitting: Pharmacist

## 2023-08-15 DIAGNOSIS — E7800 Pure hypercholesterolemia, unspecified: Secondary | ICD-10-CM

## 2023-08-15 NOTE — Progress Notes (Signed)
 error

## 2023-08-15 NOTE — Progress Notes (Signed)
 08/15/2023  Patient ID: [name], female   DOB: Aug 07, 1970, 53 y.o.   MRN: [id number]  Patient was called to follow up on hyperlipidemia.  HIPAA identifiers were obtained.  She was originally referred for help in restarted Repatha  but the Patient admitted to never using the Repatha  and sharing that she has a needle aversion and would restart statin therapy.  Unfortunately, Patient reported that she has not been taking atorvastatin  since our last conversation and has also not followed dietary recommendations or prior suggestions from this visit or her appointment with Dr. [name]. Additionally, she admitted to non-adherence with amlodipine .  However, she expressed a renewed commitment to her health and shared that she has enrolled in the [facility]. She stated that she is now motivated to follow through with the necessary lifestyle changes.  Patient's medications were reviewed via telephone:  Medications Reviewed Today     Reviewed by [name], [facility] (Pharmacist) on 08/15/23 at 1619  Med List Status: <None>   Medication Order Taking? Sig Documenting Provider Last Dose Status Informant  albuterol  (VENTOLIN  HFA) 108 (90 Base) MCG/ACT inhaler [id number] Yes Inhale 1-2 puffs into the lungs every 6 (six) hours as needed. Tanya Myla SAUNDERS, NP  Active   amLODipine  (NORVASC ) 2.5 MG tablet [id number]  Take 1 tablet (2.5 mg total) by mouth daily.  Patient not taking: Reported on 08/15/2023   Tanya Medici, MD  Active   atorvastatin  (LIPITOR) 40 MG tablet [id number] Yes Take 1 tablet three times per week. Tanya Medici, MD  Active   butalbital -acetaminophen -caffeine  (FIORICET) 50-325-40 MG tablet [id number] Yes One tab po q8h prn headache Tanya Medici, MD  Active   cetirizine  (ZYRTEC  ALLERGY ) 10 MG tablet [id number] Yes Take 1 tablet (10 mg total) by mouth daily. Tanya Savannah, [name]  Active   cyclobenzaprine  (FLEXERIL ) 5 MG tablet [id number] Yes Take 1 tablet (5 mg total) by  mouth 3 (three) times daily as needed for muscle spasms. Tanya Medici, MD  Active            Med Note [name] Jun 25, 2023 11:44 AM) AS NEEDED  fluticasone  (FLONASE ) 50 MCG/ACT nasal spray [id number] Yes Place 1 spray into both nostrils daily. Nicholson, Tanya R, NP  Active   ketoconazole  (NIZORAL ) 2 % cream [id number] Yes Apply 1 Application topically daily. Tanya Savannah, [name]  Active   linaclotide LARUE) 290 MCG CAPS capsule [id number]  1 cap(s) orally 30 minutes before the first meal of the day for 90 days [provider]  Active   nystatin  powder [id number] Yes Apply 1 Application topically 3 (three) times daily. Tanya Medici, MD  Active   omeprazole  [facility]) 40 MG capsule [id number] Yes Take 1 capsule (40 mg total) by mouth daily. Take 30 minutes prior to breakfast. Tanya Rocky SAILOR, MD  Active   promethazine -dextromethorphan (PROMETHAZINE -DM) 6.25-15 MG/5ML syrup [id number]  Take 5 mLs by mouth 4 (four) times daily as needed for cough.  Patient not taking: Reported on 08/15/2023   Tanya Myla SAUNDERS, NP  Active   triamcinolone  cream (KENALOG ) 0.1 % [id number] Yes APPLY TO AFFECTED AREA TWICE DAILY AS NEEDED Tanya Medici, MD  Active              Atorvastatin  and amlodipine  non-adherence noted  No reported adverse effects from recent lapses  Upcoming appointments:  July 14 - Dr. [facility]  July 16 - Dr. Jarold (PCP)  July 18 -  Tanya Fabry, [name] (Cardiology)  Assessment: Non-adherence to lipid-lowering and antihypertensive therapy discussed at length. Patient demonstrated insight into the long-term complications of uncontrolled cholesterol and hypertension and committed to restarting amlodipine  and atorvastatin  today.   Follow Up Plan;  Reinforced importance of medication adherence for long-term cardiovascular health  Encouraged continued participation in the weight loss program  Will follow up in 1 week to confirm medication  resumption.   [name], PharmD, BCACP Clinical Pharmacist [phone]

## 2023-08-21 ENCOUNTER — Encounter (INDEPENDENT_AMBULATORY_CARE_PROVIDER_SITE_OTHER): Payer: Self-pay

## 2023-08-21 ENCOUNTER — Telehealth: Payer: Self-pay

## 2023-08-21 ENCOUNTER — Telehealth: Payer: Self-pay | Admitting: Pharmacist

## 2023-08-21 DIAGNOSIS — R0981 Nasal congestion: Secondary | ICD-10-CM

## 2023-08-21 NOTE — Progress Notes (Signed)
   08/21/2023  Patient ID: [name], female   DOB: 1970-10-24, 53 y.o.   MRN: [id number]  Patient called to let me know she was not feeling well and to see if she could take the Mucinex  Cold and Flu product she had at her home. HIPAA identifiers were obtained.  Patient said over the past two days she developed a stuffy nose and a dull headache behind her eye. She reported the headache was 10/10 on the pain scale yesterday but was relieved by acetaminophen .  She reported her pain as 3/10 today.    She reported her blood pressures over the last few hours as: 136/87, 141/85, and 148/84.  She also checked her blood sugar while we were on the phone and her blood sugar was 95 mg/dl fasting.  She denied chest, neck, or arm pain.  She also denied nausea, vomiting or changes in vision.    She wondered if she could take Mucinex  Cold and Flu. Since the Patient has hypertension, she was instructed not to take that particular Mucinex  product as it has phenylephrine in it. She inquired about Cordicidin HBP and was told it would be safe but that I would forward her symptoms and concerns over to Dr. Jarold.  She also is using a nasal steroid ( flonase  or nasonex ).  Plan: Forward note to Dr. Everlyn nurse. Follow up with the Patient in 1-2 business days.   [name], PharmD, BCACP Clinical Pharmacist [phone]

## 2023-08-22 ENCOUNTER — Ambulatory Visit: Admitting: Family Medicine

## 2023-08-22 VITALS — BP 128/78 | HR 93 | Temp 98.1°F

## 2023-08-22 DIAGNOSIS — B349 Viral infection, unspecified: Secondary | ICD-10-CM | POA: Insufficient documentation

## 2023-08-22 DIAGNOSIS — B9789 Other viral agents as the cause of diseases classified elsewhere: Secondary | ICD-10-CM | POA: Diagnosis not present

## 2023-08-22 DIAGNOSIS — R0981 Nasal congestion: Secondary | ICD-10-CM | POA: Diagnosis not present

## 2023-08-22 DIAGNOSIS — J988 Other specified respiratory disorders: Secondary | ICD-10-CM | POA: Diagnosis not present

## 2023-08-22 LAB — POC COVID19 BINAXNOW: SARS Coronavirus 2 Ag: NEGATIVE

## 2023-08-22 MED ORDER — FLUTICASONE PROPIONATE 50 MCG/ACT NA SUSP: 1.0000 | Freq: Every day | NASAL | 1 refills | Status: AC

## 2023-08-22 MED ORDER — TRIAMCINOLONE ACETONIDE 40 MG/ML IJ SUSP
60.0000 mg | Freq: Once | INTRAMUSCULAR | Status: AC
Start: 2023-08-22 — End: 2023-08-22
  Administered 2023-08-22: 60 mg via INTRAMUSCULAR

## 2023-08-22 MED ORDER — BENZONATATE 100 MG PO CAPS: 100.0000 mg | ORAL_CAPSULE | Freq: Three times a day (TID) | ORAL | 0 refills | Status: DC | PRN

## 2023-08-22 NOTE — Patient Instructions (Signed)

## 2023-08-22 NOTE — Progress Notes (Signed)
 I,[name],acting as a Neurosurgeon for [employer], NP.,have documented all relevant documentation on the behalf of [name], NP,as directed by  [name], NP while in the presence of [name], NP.  Subjective:  Patient ID: [name] , female    DOB: [birth year] , 53 y.o.   MRN: [id number]  Chief Complaint  Patient presents with   Cough    HPI  Patient is a 53 year old female who presents today for headache, congestion and postnasal drip. She has had these symptoms for 3 days. Patient denies body aches or fever.     Past Medical History:  Diagnosis Date   Abdominal pain    Allergy     Allergy -induced asthma    Angina 05/2011   NO HEART PROBLEM WENT TO ED 05/22/11 FOR CP STRESS TEST WAS NEGATIVE    Ankle fracture    Aortic atherosclerosis (HCC) 09/01/2021   Back pain    Chronic back pain greater than 3 months duration 01/17/2011   injured lumbar and posterior cervical neck in fall    Constipation    DDD (degenerative disc disease), cervical    Depression    Diverticulitis    Edema of both lower extremities    Gallbladder problem    GERD (gastroesophageal reflux disease)    H/O hiatal hernia    Hyperlipidemia    IBS (irritable bowel syndrome)    Joint pain    Nausea    Neuromuscular disorder (HCC)    LUMBA BACK  TROUBLE    Pre-diabetes    Rash    UNDER BIL BREASTS   Sleep apnea    SOB (shortness of breath)    Vitamin B 12 deficiency    Vitamin D  deficiency    Wrist fracture      Family History  Problem Relation Age of Onset   Diabetes Mother    Hyperlipidemia Mother    Cancer Mother    Sleep apnea Mother    Obesity Mother    Heart attack Father    Cancer Father        kidney and prostate   Hyperlipidemia Father    Other Father        gout and clogged arteries   Hypertension Father    Kidney disease Father    Alcoholism Father    Renal Disease Father    Hyperlipidemia Sister    Diabetes Sister    Diabetes Brother    Hyperlipidemia  Brother    Coronary artery disease Brother    Heart disease Maternal Grandmother      Current Outpatient Medications:    albuterol  (VENTOLIN  HFA) 108 (90 Base) MCG/ACT inhaler, Inhale 1-2 puffs into the lungs every 6 (six) hours as needed., Disp: 1 each, Rfl: 0   amLODipine  (NORVASC ) 2.5 MG tablet, Take 1 tablet (2.5 mg total) by mouth daily., Disp: 30 tablet, Rfl: 11   atorvastatin  (LIPITOR) 40 MG tablet, Take 1 tablet three times per week., Disp: 30 tablet, Rfl: 1   benzonatate  (TESSALON  PERLES) 100 MG capsule, Take 1 capsule (100 mg total) by mouth 3 (three) times daily as needed., Disp: 30 capsule, Rfl: 0   butalbital -acetaminophen -caffeine  (FIORICET) 50-325-40 MG tablet, One tab po q8h prn headache, Disp: 20 tablet, Rfl: 0   cetirizine  (ZYRTEC  ALLERGY ) 10 MG tablet, Take 1 tablet (10 mg total) by mouth daily., Disp: 30 tablet, Rfl: 0   cyclobenzaprine  (FLEXERIL ) 5 MG tablet, Take 1 tablet (5 mg total) by mouth 3 (three) times  daily as needed for muscle spasms., Disp: 30 tablet, Rfl: 0   ketoconazole  (NIZORAL ) 2 % cream, Apply 1 Application topically daily., Disp: 60 g, Rfl: 0   linaclotide (LINZESS) 290 MCG CAPS capsule, 1 cap(s) orally 30 minutes before the first meal of the day for 90 days, Disp: , Rfl:    nystatin  powder, Apply 1 Application topically 3 (three) times daily., Disp: 45 g, Rfl: 1   omeprazole  (PRILOSEC) 40 MG capsule, Take 1 capsule (40 mg total) by mouth daily. Take 30 minutes prior to breakfast., Disp: 30 capsule, Rfl: 3   triamcinolone  cream (KENALOG ) 0.1 %, APPLY TO AFFECTED AREA TWICE DAILY AS NEEDED, Disp: 30 g, Rfl: 0   fluticasone  (FLONASE ) 50 MCG/ACT nasal spray, Place 1 spray into both nostrils daily., Disp: 15.8 mL, Rfl: 1   promethazine -dextromethorphan (PROMETHAZINE -DM) 6.25-15 MG/5ML syrup, Take 5 mLs by mouth 4 (four) times daily as needed for cough. (Patient not taking: Reported on 08/15/2023), Disp: 118 mL, Rfl: 0   Allergies  Allergen Reactions    Amoxicillin Hives   Sulfa Antibiotics Hives   Penicillins Hives and Itching    Has patient had a PCN reaction causing immediate rash, facial/tongue/throat swelling, SOB or lightheadedness with hypotension: Yes Has patient had a PCN reaction causing severe rash involving mucus membranes or skin necrosis: No Has patient had a PCN reaction that required hospitalization No Has patient had a PCN reaction occurring within the last 10 years: Yes If all of the above answers are NO, then may proceed with Cephalosporin use.    Tape     PLASTIC TAPE   (WHELPS, TORE SKIN)   Tramadol Hives     Review of Systems  HENT:  Positive for congestion, postnasal drip and sinus pressure.   Respiratory:  Positive for cough. Negative for shortness of breath and wheezing.   Cardiovascular: Negative.   Musculoskeletal: Negative.   Skin: Negative.   Neurological:  Positive for headaches.  Psychiatric/Behavioral: Negative.       Today's Vitals   08/22/23 1614  BP: 128/78  Pulse: 93  Temp: 98.1 F (36.7 C)  TempSrc: Oral  SpO2: 97%   There is no height or weight on file to calculate BMI.  Wt Readings from Last 3 Encounters:  07/06/23 272 lb (123.4 kg)  06/27/23 272 lb (123.4 kg)  05/22/23 275 lb 9.6 oz (125 kg)    The 10-year ASCVD risk score (Arnett DK, et al., 2019) is: 5.9%   Values used to calculate the score:     Age: 53 years     Clincally relevant sex: Female     Is Non-Hispanic African American: Yes     Diabetic: No     Tobacco smoker: No     Systolic Blood Pressure: 126 mmHg     Is BP treated: Yes     HDL Cholesterol: 43 mg/dL     Total Cholesterol: 209 mg/dL  Objective:  Physical Exam HENT:     Head: Normocephalic.  Cardiovascular:     Rate and Rhythm: Normal rate and regular rhythm.  Pulmonary:     Effort: Pulmonary effort is normal.     Breath sounds: Normal breath sounds.  Skin:    General: Skin is warm and dry.  Neurological:     General: No focal deficit present.      Mental Status: She is alert and oriented to person, place, and time.  Psychiatric:        Mood and Affect: Mood normal.  Behavior: Behavior normal.         Assessment And Plan:  Nasal congestion -     Triamcinolone  Acetonide -     POC COVID-19 BinaxNow  Viral respiratory illness -     Benzonatate ; Take 1 capsule (100 mg total) by mouth 3 (three) times daily as needed.  Dispense: 30 capsule; Refill: 0 -     Fluticasone  Propionate; Place 1 spray into both nostrils daily.  Dispense: 15.8 mL; Refill: 1    Return for keep next appt.  Patient was given opportunity to ask questions. Patient verbalized understanding of the plan and was able to repeat key elements of the plan. All questions were answered to their satisfaction.    I, [name], NP, have reviewed all documentation for this visit. The documentation on 09/03/2023 for the exam, diagnosis, procedures, and orders are all accurate and complete.    IF YOU HAVE BEEN REFERRED TO A SPECIALIST, IT MAY TAKE 1-2 WEEKS TO SCHEDULE/PROCESS THE REFERRAL. IF YOU HAVE NOT HEARD FROM US /SPECIALIST IN TWO WEEKS, PLEASE GIVE US  A CALL AT [phone] X [phone].

## 2023-08-23 ENCOUNTER — Telehealth: Payer: Self-pay | Admitting: Pharmacist

## 2023-08-23 DIAGNOSIS — R0981 Nasal congestion: Secondary | ICD-10-CM

## 2023-08-23 NOTE — Progress Notes (Signed)
   08/23/2023  Patient ID: [name], female   DOB: December 19, 1970, 53 y.o.   MRN: [id number]  Patient was called to follow up after her sick visit yesterday. Unfortunately, she did not answer her phone. HIPAA compliant message was left on her voicemail.  Plan: Call Patient back in 2 weeks.   Cassius DOROTHA Brought, PharmD, BCACP Clinical Pharmacist [phone]

## 2023-08-31 ENCOUNTER — Telehealth: Payer: Self-pay | Admitting: Pharmacist

## 2023-08-31 DIAGNOSIS — E7800 Pure hypercholesterolemia, unspecified: Secondary | ICD-10-CM

## 2023-08-31 NOTE — Progress Notes (Signed)
   08/31/2023  Patient ID: [name], female   DOB: [birth year], 53 y.o.   MRN: [id number]  Called Patient to follow up on her lipid management. Unfortunately, she did not answer her phone. HIPAA compliant message was left on her voicemail.  Plan: Will call Patient back in 1 month.  Cassius DOROTHA Brought, PharmD, BCACP Clinical Pharmacist [phone]

## 2023-09-02 ENCOUNTER — Encounter: Payer: Self-pay | Admitting: Internal Medicine

## 2023-09-03 ENCOUNTER — Other Ambulatory Visit: Payer: Self-pay

## 2023-09-03 ENCOUNTER — Encounter (INDEPENDENT_AMBULATORY_CARE_PROVIDER_SITE_OTHER): Payer: Self-pay | Admitting: Internal Medicine

## 2023-09-03 ENCOUNTER — Ambulatory Visit (INDEPENDENT_AMBULATORY_CARE_PROVIDER_SITE_OTHER): Admitting: Internal Medicine

## 2023-09-03 VITALS — BP 126/85 | HR 68 | Temp 97.5°F | Ht 68.0 in | Wt 275.0 lb

## 2023-09-03 DIAGNOSIS — E66813 Obesity, class 3: Secondary | ICD-10-CM

## 2023-09-03 DIAGNOSIS — R7303 Prediabetes: Secondary | ICD-10-CM

## 2023-09-03 DIAGNOSIS — Z6841 Body Mass Index (BMI) 40.0 and over, adult: Secondary | ICD-10-CM

## 2023-09-03 DIAGNOSIS — L304 Erythema intertrigo: Secondary | ICD-10-CM

## 2023-09-03 DIAGNOSIS — J4520 Mild intermittent asthma, uncomplicated: Secondary | ICD-10-CM

## 2023-09-03 DIAGNOSIS — R5383 Other fatigue: Secondary | ICD-10-CM | POA: Diagnosis not present

## 2023-09-03 DIAGNOSIS — G4733 Obstructive sleep apnea (adult) (pediatric): Secondary | ICD-10-CM

## 2023-09-03 DIAGNOSIS — R0602 Shortness of breath: Secondary | ICD-10-CM | POA: Diagnosis not present

## 2023-09-03 DIAGNOSIS — Z1331 Encounter for screening for depression: Secondary | ICD-10-CM

## 2023-09-03 DIAGNOSIS — E782 Mixed hyperlipidemia: Secondary | ICD-10-CM | POA: Diagnosis not present

## 2023-09-03 MED ORDER — NYSTATIN 100000 UNIT/GM EX POWD: 1.0000 | Freq: Two times a day (BID) | CUTANEOUS | 1 refills | Status: DC | PRN

## 2023-09-03 NOTE — Assessment & Plan Note (Signed)
 Currently controlled on as needed use of albuterol .  Obesity is associated with airway hyperresponsiveness losing 10 to 15% will improve condition.

## 2023-09-03 NOTE — Assessment & Plan Note (Signed)
 Most recent A1c is  Lab Results  Component Value Date   HGBA1C 6.2 (H) 05/22/2023   HGBA1C 5.7 (H) 12/17/2017    Patient aware of disease state and risk of progression. This may contribute to abnormal cravings, fatigue and diabetic complications without having diabetes.   We have discussed treatment options which include: losing 7 to 10% of body weight, increasing physical activity to a goal of 150 minutes a week at moderate intensity.  Advised to maintain a diet low on simple and processed carbohydrates.  She may also be a candidate for pharmacoprophylaxis with metformin or incretin mimetic.   Check fasting insulin , hemoglobin A1c and fasting blood glucose today.

## 2023-09-03 NOTE — Assessment & Plan Note (Signed)
 LDL is not at goal. Elevated LDL may be secondary to nutrition, genetics and spillover effect from excess adiposity. Recommended LDL goal is <70 to reduce the risk of fatty streaks and the progression to obstructive ASCVD in the future.   Her 10 year risk is: The 10-year ASCVD risk score (Arnett DK, et al., 2019) is: 5.9% she is on atorvastatin  40  Lab Results  Component Value Date   CHOL 209 (H) 05/22/2023   HDL 43 05/22/2023   LDLCALC 142 (H) 05/22/2023   TRIG 132 05/22/2023   CHOLHDL 4.9 (H) 05/22/2023    Continue weight loss therapy, losing 10% or more of body weight may improve condition. Also advised to reduce saturated fats in diet to less than 10% of daily calories.   Check fasting lipid profile currently on atorvastatin  40.

## 2023-09-03 NOTE — Assessment & Plan Note (Signed)
 Other contributing factors: family history of obesity, reduced physical activity, chronic skipping of meals, menopause, multiple weight loss attempts in the past, and sedentary job.   See obesity treatment plan

## 2023-09-03 NOTE — Assessment & Plan Note (Signed)
 On CPAP with reported good compliance. Continue PAP therapy. Losing 15% or more of body weight may improve AHI.  We discussed how sleep apnea may affect weight loss due to disruption of energy regulation system.

## 2023-09-03 NOTE — Progress Notes (Signed)
 [address] [place],  [place], [place] [address]  Office: [phone]  /  Fax: [phone]   Subjective   Initial Visit  [name] (MR# [id number]) is a 53 y.o. female who presents for evaluation and treatment of obesity and related comorbidities. Current BMI is Body mass index is 41.81 kg/m. [name] has been struggling with her weight for many years and has been unsuccessful in either losing weight, maintaining weight loss, or reaching her healthy weight goal.  [name] is currently in the action stage of change and ready to dedicate time achieving and maintaining a healthier weight. [name] is interested in becoming our patient and working on intensive lifestyle modifications including (but not limited to) diet and exercise for weight loss.  Weight history:  When asked how their weight has affected their life and health, she states: Contributed to medical problems, Having fatigue, and Having poor endurance  When asked what else they would like to accomplish? She states: Adopt a healthier eating pattern and lifestyle, Improve energy levels and physical activity, Improve existing medical conditions, Reduce number of medications, Improve quality of life, and Lose   She starting to note weight gain during : adulthood.  Life events associated with weight gain include : depression, death in family.   Other contributing factors: family history of obesity, reduced physical activity, chronic skipping of meals, menopause, multiple weight loss attempts in the past, and sedentary job.  Their highest weight has been:  285 lbs.  Desired weight: 190  Previous weight-loss programs : Weight Watchers and Low Carb.  Their maximum weight loss was:  85 and 30 lbs.  Their greatest challenge with dieting: none.  Current or previous pharmacotherapy: None.  Response to medication: Never tried medications   Nutritional History:  Current nutrition plan: None.  How many times do you eat outside the  home: 2-4 per week  How often do they skip meals: skips 1-2 meals a day  What beverages do they drink: water, smoothies, and alcohol, type: tquila, rarely  Use of artificial sweetners : Yes  Food intolerances or dislikes: vegetables.  Food triggers: Stress, Boredom, When angry or upset, Seeking reward, To help comfort self, and When Sad.  Food cravings: Sugary and Starches / Carbohydrates  Do they struggle with excessive hunger or portion control : No    Physical Activity:  Current level of physical activity: Other: Dancng and wlaking 30 min 2 days a week  Barriers to Exercise: None   Past medical history includes:   Past Medical History:  Diagnosis Date   Abdominal pain    Allergy     Allergy -induced asthma    Angina 05/2011   NO HEART PROBLEM WENT TO ED 05/22/11 FOR CP STRESS TEST WAS NEGATIVE    Ankle fracture    Aortic atherosclerosis (HCC) 09/01/2021   Back pain    Chronic back pain greater than 3 months duration 01/17/2011   injured lumbar and posterior cervical neck in fall    Constipation    DDD (degenerative disc disease), cervical    Depression    Diverticulitis    Edema of both lower extremities    Gallbladder problem    GERD (gastroesophageal reflux disease)    H/O hiatal hernia    Hyperlipidemia    IBS (irritable bowel syndrome)    Joint pain    Nausea    Neuromuscular disorder (HCC)    LUMBA BACK  TROUBLE    Pre-diabetes    Rash    UNDER BIL BREASTS  Sleep apnea    SOB (shortness of breath)    Vitamin B 12 deficiency    Vitamin D  deficiency    Wrist fracture      Objective   BP 126/85   Pulse 68   Temp (!) 97.5 F (36.4 C)   Ht 5' 8 (1.727 m)   Wt 275 lb (124.7 kg)   LMP 11/05/2015 Comment: tubal ligation  SpO2 98%   BMI 41.81 kg/m  She was weighed on the bioimpedance scale: Body mass index is 41.81 kg/m.    Anthropometrics:  Vitals Temp: (!) 97.5 F (36.4 C) BP: 126/85 Pulse Rate: 68 SpO2: 98 %   Anthropometric  Measurements Height: 5' 8 (1.727 m) Weight: 275 lb (124.7 kg) BMI (Calculated): 41.82 Starting Weight: 275 lb Peak Weight: 285 lb Waist Measurement : 46 inches   Body Composition  Body Fat %: 49.7 % Fat Mass (lbs): 136.8 lbs Muscle Mass (lbs): 131.6 lbs Total Body Water (lbs): 96.4 lbs Visceral Fat Rating : 16   Other Clinical Data RMR: 1829 Fasting: yes Labs: yes Today's Visit #: 1 Starting Date: 09/03/23    Physical Exam:  General: She is overweight, cooperative, alert, well developed, and in no acute distress. PSYCH: Has normal mood, affect and thought process.   HEENT: EOMI, sclerae are anicteric. Lungs: Normal breathing effort, no conversational dyspnea. Extremities: No edema.  Neurologic: No gross sensory or motor deficits. No tremors or fasciculations noted.    Diagnostic Data Reviewed   Indirect Calorimeter completed today shows a VO2 of 265 and a REE of 1829.  Her calculated basal metabolic rate is 8018 thus her resting energy expenditure slower than calculated.  Depression Screen  Alecea's PHQ-9 score was: 8.     09/03/2023    8:26 AM  Depression screen PHQ 2/9  Decreased Interest 1  Down, Depressed, Hopeless 1  PHQ - 2 Score 2  Altered sleeping 2  Tired, decreased energy 2  Change in appetite 1  Feeling bad or failure about yourself  0  Trouble concentrating 1  Moving slowly or fidgety/restless 0  Suicidal thoughts 0  PHQ-9 Score 8    Screening for Sleep Related Breathing Disorders  Timber admits to daytime somnolence and admits to waking up still tired. Patient has a history of symptoms of morning headache. Mireya generally gets 5 or 6 hours of sleep per night, and states that she has generally restful sleep. Snoring is not present since using CPAP. Apneic episodes are present but controlled since using CPAP. Epworth Sleepiness Score is 3.   BMET    Component Value Date/Time   NA 138 06/13/2023 0249   NA 143 05/22/2023 0926   K 4.5  06/13/2023 0249   CL 103 06/13/2023 0249   CO2 23 06/13/2023 0249   GLUCOSE 145 (H) 06/13/2023 0249   BUN 15 06/13/2023 0249   BUN 10 05/22/2023 0926   CREATININE 0.71 06/13/2023 0249   CALCIUM  10.0 06/13/2023 0249   GFRNONAA >60 06/13/2023 0249   GFRAA 108 06/30/2020 0000   Lab Results  Component Value Date   HGBA1C 6.2 (H) 05/22/2023   HGBA1C 5.7 (H) 12/17/2017   Lab Results  Component Value Date   INSULIN  9.2 11/03/2021   INSULIN  7.8 12/17/2017   CBC    Component Value Date/Time   WBC 5.6 06/13/2023 0249   RBC 5.14 (H) 06/13/2023 0249   HGB 13.7 06/13/2023 0249   HGB 13.6 11/20/2022 1011   HCT 41.8 06/13/2023 0249  HCT 42.7 11/20/2022 1011   PLT 256 06/13/2023 0249   PLT 213 11/20/2022 1011   MCV 81.3 06/13/2023 0249   MCV 83 11/20/2022 1011   MCH 26.7 06/13/2023 0249   MCHC 32.8 06/13/2023 0249   RDW 12.9 06/13/2023 0249   RDW 12.6 11/20/2022 1011   Iron/TIBC/Ferritin/ %Sat    Component Value Date/Time   IRON 66 09/30/2019 0000   FERRITIN 91 09/30/2019 0000   Lipid Panel     Component Value Date/Time   CHOL 209 (H) 05/22/2023 0926   TRIG 132 05/22/2023 0926   HDL 43 05/22/2023 0926   CHOLHDL 4.9 (H) 05/22/2023 0926   LDLCALC 142 (H) 05/22/2023 0926   Hepatic Function Panel     Component Value Date/Time   PROT 6.9 05/22/2023 0926   ALBUMIN 4.4 05/22/2023 0926   AST 20 05/22/2023 0926   ALT 18 05/22/2023 0926   ALKPHOS 78 05/22/2023 0926   BILITOT 0.2 05/22/2023 0926   BILIDIR 0.1 06/30/2020 0000   BILIDIR 0.10 10/16/2019 0930      Component Value Date/Time   TSH 1.480 11/20/2022 1011     Assessment and Plan   TREATMENT PLAN FOR OBESITY:  Recommended Dietary Goals  Ewa is currently in the action stage of change. As such, her goal is to implement medically supervised obesity management plan.  She has agreed to implement: the Category 2 plan - 1200 kcal per day  Behavioral Intervention  We discussed the following Behavioral  Modification Strategies today: increasing lean protein intake to established goals, decreasing simple carbohydrates , increasing vegetables, increasing lower glycemic fruits, increasing fiber rich foods, avoiding skipping meals, increasing water intake, work on meal planning and preparation, reading food labels , keeping healthy foods at home, identifying sources and decreasing liquid calories, decreasing eating out or consumption of processed foods, and making healthy choices when eating convenient foods, planning for success, and better snacking choices  Additional resources provided today: Handout on healthy eating and balanced plate, Handout on complex carbohydrates and lean sources of protein, and Category 2 packet  Recommended Physical Activity Goals  Adellyn has been advised to work up to 150 minutes of moderate intensity aerobic activity a week and strengthening exercises 2-3 times per week for cardiovascular health, weight loss maintenance and preservation of muscle mass.   She has agreed to :  Think about enjoyable ways to increase daily physical activity and overcoming barriers to exercise and Increase physical activity in their day and reduce sedentary time (increase NEAT).  Medical Interventions and Pharmacotherapy We will work on building a Therapist, art and behavioral strategies. We will discuss the role of pharmacotherapy as an adjunct at subsequent visits.   ASSOCIATED CONDITIONS ADDRESSED TODAY  Other Fatigue Dilia admits to daytime somnolence and admits to waking up still tired. Patient has a history of symptoms of morning headache. Vendetta generally gets 5 or 6 hours of sleep per night, and states that she has generally restful sleep. Snoring is not present since using CPAP. Apneic episodes are present but controlled since using CPAP. Epworth Sleepiness Score is 3. Zerah does feel that her weight is causing her energy to be lower than it should be. Fatigue  may be related to obesity, depression or many other causes. Labs will be ordered, and in the meanwhile, Aldina will focus on self care including making healthy food choices, increasing physical activity and focusing on stress reduction.  Shortness of Breath Samreen notes increasing shortness of breath with physical activity  and seems to be worsening over time with weight gain. She notes getting out of breath sooner with activity than she used to. This has not gotten worse recently. Etha denies shortness of breath at rest or orthopnea.  SOB (shortness of breath) on exertion  Depression screen  Other fatigue -     Vitamin B12 -     CBC with Differential/Platelet  OSA on CPAP Assessment & Plan: On CPAP with reported good compliance. Continue PAP therapy. Losing 15% or more of body weight may improve AHI.  We discussed how sleep apnea may affect weight loss due to disruption of energy regulation system.    Mild intermittent asthma without complication Assessment & Plan: Currently controlled on as needed use of albuterol .  Obesity is associated with airway hyperresponsiveness losing 10 to 15% will improve condition.   Mixed hyperlipidemia Assessment & Plan: LDL is not at goal. Elevated LDL may be secondary to nutrition, genetics and spillover effect from excess adiposity. Recommended LDL goal is <70 to reduce the risk of fatty streaks and the progression to obstructive ASCVD in the future.   Her 10 year risk is: The 10-year ASCVD risk score (Arnett DK, et al., 2019) is: 5.9% she is on atorvastatin  40  Lab Results  Component Value Date   CHOL 209 (H) 05/22/2023   HDL 43 05/22/2023   LDLCALC 142 (H) 05/22/2023   TRIG 132 05/22/2023   CHOLHDL 4.9 (H) 05/22/2023    Continue weight loss therapy, losing 10% or more of body weight may improve condition. Also advised to reduce saturated fats in diet to less than 10% of daily calories.   Check fasting lipid profile currently on  atorvastatin  40.    Orders: -     TSH -     Lipid Panel With LDL/HDL Ratio  Prediabetes Assessment & Plan: Most recent A1c is  Lab Results  Component Value Date   HGBA1C 6.2 (H) 05/22/2023   HGBA1C 5.7 (H) 12/17/2017    Patient aware of disease state and risk of progression. This may contribute to abnormal cravings, fatigue and diabetic complications without having diabetes.   We have discussed treatment options which include: losing 7 to 10% of body weight, increasing physical activity to a goal of 150 minutes a week at moderate intensity.  Advised to maintain a diet low on simple and processed carbohydrates.  She may also be a candidate for pharmacoprophylaxis with metformin or incretin mimetic.   Check fasting insulin , hemoglobin A1c and fasting blood glucose today.   Orders: -     Comprehensive metabolic panel with GFR -     Hemoglobin A1c -     Insulin , random  Class 3 severe obesity with serious comorbidity and body mass index (BMI) of 40.0 to 44.9 in adult, unspecified obesity type Assessment & Plan: Other contributing factors: family history of obesity, reduced physical activity, chronic skipping of meals, menopause, multiple weight loss attempts in the past, and sedentary job.   See obesity treatment plan  Orders: -     VITAMIN D  25 Hydroxy (Vit-D Deficiency, Fractures)    Follow-up  She was informed of the importance of frequent follow-up visits to maximize her success with intensive lifestyle modifications for her multiple health conditions. She was informed we would discuss her lab results at her next visit unless there is a critical issue that needs to be addressed sooner. Chardae agreed to keep her next visit at the agreed upon time to discuss these results.  Attestation  Statement  This is the patient's intake visit at [facility]. The patient's Health Questionnaire was reviewed at length. Included in the packet: current and past health  history, medications, allergies, ROS, gynecologic history (women only), surgical history, family history, social history, weight history, weight loss surgery history (for those that have had weight loss surgery), nutritional evaluation, mood and food questionnaire, PHQ9, Epworth questionnaire, sleep habits questionnaire, patient life and health improvement goals questionnaire. These will all be scanned into the patient's chart under media.   During the visit, I independently reviewed the patient's, previous labs, bioimpedance scale results, and indirect calorimetry results. I used this information to medically tailor a meal plan for the patient that will help her to lose weight and will improve her obesity-related conditions. I performed a medically necessary appropriate examination and/or evaluation. I discussed the assessment and treatment plan with the patient. The patient was provided an opportunity to ask questions and all were answered. The patient agreed with the plan and demonstrated an understanding of the instructions. Labs were ordered at this visit and will be reviewed at the next visit unless critical results need to be addressed immediately. Clinical information was updated and documented in the EMR.   In addition, they received basic education on identification of processed foods and reduction of these, different sources of lean proteins and complex carbohydrates and how to eat balanced by incorporation of whole foods.  Reviewed by clinician on day of visit: allergies, medications, problem list, medical history, surgical history, family history, social history, and previous encounter notes.  I have spent 44 minutes in the care of the patient today including: 3 minutes before the visit reviewing and preparing the chart. 35 minutes face-to-face assessing and reviewing listed medical problems as outlined in obesity care plan, providing nutritional and behavioral counseling on topics outlined in  the obesity care plan, independently interpreting test results and goals of care, as described in assessment and plan, reviewing and discussing biometric information and progress, and ordering diagnostics - see orders 6 minutes after the visit updating chart and documentation of encounter.       Lucas Parker, MD

## 2023-09-04 LAB — HEMOGLOBIN A1C
Est. average glucose Bld gHb Est-mCnc: 128 mg/dL
Hgb A1c MFr Bld: 6.1 % — ABNORMAL HIGH (ref 4.8–5.6)

## 2023-09-04 LAB — CBC WITH DIFFERENTIAL/PLATELET
Basophils Absolute: 0 x10E3/uL (ref 0.0–0.2)
Basos: 1 %
EOS (ABSOLUTE): 0 x10E3/uL (ref 0.0–0.4)
Eos: 1 %
Hematocrit: 41.5 % (ref 34.0–46.6)
Hemoglobin: 13.1 g/dL (ref 11.1–15.9)
Immature Grans (Abs): 0 x10E3/uL (ref 0.0–0.1)
Immature Granulocytes: 0 %
Lymphocytes Absolute: 1.9 x10E3/uL (ref 0.7–3.1)
Lymphs: 48 %
MCH: 26.4 pg — ABNORMAL LOW (ref 26.6–33.0)
MCHC: 31.6 g/dL (ref 31.5–35.7)
MCV: 84 fL (ref 79–97)
Monocytes Absolute: 0.2 x10E3/uL (ref 0.1–0.9)
Monocytes: 6 %
Neutrophils Absolute: 1.7 x10E3/uL (ref 1.4–7.0)
Neutrophils: 44 %
Platelets: 219 x10E3/uL (ref 150–450)
RBC: 4.97 x10E6/uL (ref 3.77–5.28)
RDW: 13.7 % (ref 11.7–15.4)
WBC: 3.8 x10E3/uL (ref 3.4–10.8)

## 2023-09-04 LAB — COMPREHENSIVE METABOLIC PANEL WITH GFR
ALT: 18 IU/L (ref 0–32)
AST: 22 IU/L (ref 0–40)
Albumin: 4.5 g/dL (ref 3.8–4.9)
Alkaline Phosphatase: 78 IU/L (ref 44–121)
BUN/Creatinine Ratio: 18 (ref 9–23)
BUN: 12 mg/dL (ref 6–24)
Bilirubin Total: 0.3 mg/dL (ref 0.0–1.2)
CO2: 23 mmol/L (ref 20–29)
Calcium: 9.5 mg/dL (ref 8.7–10.2)
Chloride: 102 mmol/L (ref 96–106)
Creatinine, Ser: 0.68 mg/dL (ref 0.57–1.00)
Globulin, Total: 2.5 g/dL (ref 1.5–4.5)
Glucose: 90 mg/dL (ref 70–99)
Potassium: 4.2 mmol/L (ref 3.5–5.2)
Sodium: 141 mmol/L (ref 134–144)
Total Protein: 7 g/dL (ref 6.0–8.5)
eGFR: 103 mL/min/1.73 (ref >59)

## 2023-09-04 LAB — LIPID PANEL WITH LDL/HDL RATIO
Cholesterol, Total: 205 mg/dL — ABNORMAL HIGH (ref 100–199)
HDL: 46 mg/dL (ref >39)
LDL Chol Calc (NIH): 140 mg/dL — ABNORMAL HIGH (ref 0–99)
LDL/HDL Ratio: 3 ratio (ref 0.0–3.2)
Triglycerides: 105 mg/dL (ref 0–149)
VLDL Cholesterol Cal: 19 mg/dL (ref 5–40)

## 2023-09-04 LAB — INSULIN, RANDOM: INSULIN: 20.9 u[IU]/mL (ref 2.6–24.9)

## 2023-09-04 LAB — VITAMIN B12: Vitamin B-12: 431 pg/mL (ref 232–1245)

## 2023-09-04 LAB — TSH: TSH: 2.96 u[IU]/mL (ref 0.450–4.500)

## 2023-09-04 LAB — VITAMIN D 25 HYDROXY (VIT D DEFICIENCY, FRACTURES): Vit D, 25-Hydroxy: 23.9 ng/mL — ABNORMAL LOW (ref 30.0–100.0)

## 2023-09-04 NOTE — Patient Instructions (Signed)
 Hypertension, Adult Hypertension is another name for high blood pressure. High blood pressure forces your heart to work harder to pump blood. This can cause problems over time. There are two numbers in a blood pressure reading. There is a top number (systolic) over a bottom number (diastolic). It is best to have a blood pressure that is below 120/80. What are the causes? The cause of this condition is not known. Some other conditions can lead to high blood pressure. What increases the risk? Some lifestyle factors can make you more likely to develop high blood pressure: Smoking. Not getting enough exercise or physical activity. Being overweight. Having too much fat, sugar, calories, or salt (sodium) in your diet. Drinking too much alcohol. Other risk factors include: Having any of these conditions: Heart disease. Diabetes. High cholesterol. Kidney disease. Obstructive sleep apnea. Having a family history of high blood pressure and high cholesterol. Age. The risk increases with age. Stress. What are the signs or symptoms? High blood pressure may not cause symptoms. Very high blood pressure (hypertensive crisis) may cause: Headache. Fast or uneven heartbeats (palpitations). Shortness of breath. Nosebleed. Vomiting or feeling like you may vomit (nauseous). Changes in how you see. Very bad chest pain. Feeling dizzy. Seizures. How is this treated? This condition is treated by making healthy lifestyle changes, such as: Eating healthy foods. Exercising more. Drinking less alcohol. Your doctor may prescribe medicine if lifestyle changes do not help enough and if: Your top number is above 130. Your bottom number is above 80. Your personal target blood pressure may vary. Follow these instructions at home: Eating and drinking  If told, follow the DASH eating plan. To follow this plan: Fill one half of your plate at each meal with fruits and vegetables. Fill one fourth of your plate  at each meal with whole grains. Whole grains include whole-wheat pasta, brown rice, and whole-grain bread. Eat or drink low-fat dairy products, such as skim milk or low-fat yogurt. Fill one fourth of your plate at each meal with low-fat (lean) proteins. Low-fat proteins include fish, chicken without skin, eggs, beans, and tofu. Avoid fatty meat, cured and processed meat, or chicken with skin. Avoid pre-made or processed food. Limit the amount of salt in your diet to less than 1,500 mg each day. Do not drink alcohol if: Your doctor tells you not to drink. You are pregnant, may be pregnant, or are planning to become pregnant. If you drink alcohol: Limit how much you have to: 0-1 drink a day for women. 0-2 drinks a day for men. Know how much alcohol is in your drink. In the [place] one drink equals one 12 oz bottle of beer (355 mL), one 5 oz glass of wine (148 mL), or one 1 oz glass of hard liquor (44 mL). Lifestyle  Work with your doctor to stay at a healthy weight or to lose weight. Ask your doctor what the best weight is for you. Get at least 30 minutes of exercise that causes your heart to beat faster (aerobic exercise) most days of the week. This may include walking, swimming, or biking. Get at least 30 minutes of exercise that strengthens your muscles (resistance exercise) at least 3 days a week. This may include lifting weights or doing Pilates. Do not smoke or use any products that contain nicotine or tobacco. If you need help quitting, ask your doctor. Check your blood pressure at home as told by your doctor. Keep all follow-up visits. Medicines Take over-the-counter and prescription medicines  only as told by your doctor. Follow directions carefully. Do not skip doses of blood pressure medicine. The medicine does not work as well if you skip doses. Skipping doses also puts you at risk for problems. Ask your doctor about side effects or reactions to medicines that you should watch  for. Contact a doctor if: You think you are having a reaction to the medicine you are taking. You have headaches that keep coming back. You feel dizzy. You have swelling in your ankles. You have trouble with your vision. Get help right away if: You get a very bad headache. You start to feel mixed up (confused). You feel weak or numb. You feel faint. You have very bad pain in your: Chest. Belly (abdomen). You vomit more than once. You have trouble breathing. These symptoms may be an emergency. Get help right away. Call 911. Do not wait to see if the symptoms will go away. Do not drive yourself to the hospital. Summary Hypertension is another name for high blood pressure. High blood pressure forces your heart to work harder to pump blood. For most people, a normal blood pressure is less than 120/80. Making healthy choices can help lower blood pressure. If your blood pressure does not get lower with healthy choices, you may need to take medicine. This information is not intended to replace advice given to you by your health care provider. Make sure you discuss any questions you have with your health care provider. Document Revised: 11/25/2020 Document Reviewed: 11/25/2020 Elsevier Patient Education  2024 [employer].

## 2023-09-04 NOTE — Progress Notes (Signed)
 I,Victoria T Emmitt, CMA,acting as a Neurosurgeon for Catheryn LOISE Slocumb, MD.,have documented all relevant documentation on the behalf of Catheryn LOISE Slocumb, MD,as directed by  Catheryn LOISE Slocumb, MD while in the presence of Catheryn LOISE Slocumb, MD.  Subjective:  Patient ID: [name] , female    DOB: [birth year] , 53 y.o.   MRN: [id number]  Chief Complaint  Patient presents with   Hypertension    Patient presents today for bp & cholesterol follow up. She reports compliance with medications. Denies headache, chest pain & sob. She asks how long is she to be on the Amlodipine . She admits after starting bp medication she noticed swelling in her ankles.  She will call to schedule mammogram.     Hyperlipidemia    HPI Discussed the use of AI scribe software for clinical note transcription with the patient, who gave verbal consent to proceed.  History of Present Illness [name] is a 53 year old female with hypertension and hyperlipidemia who presents for blood pressure management and cholesterol follow-up.  She is currently on medication for hypertension and is concerned about the duration of treatment. She has initiated a weight loss program with the goal of losing 10% of her body weight, which she hopes will aid in managing her blood pressure. Her exercise routine includes walking and line dancing twice a week, and she is establishing a morning routine to avoid the heat.  Regarding hyperlipidemia, she takes cholesterol medication three days a week due to side effects. Her recent lab work showed an LDL cholesterol level of 140 mg/dL, above the target of less than 70 mg/dL. She has a history of aortic plaque and an elevated LP(a) score. She is considering dietary changes, such as reducing meat and cheese intake, to manage her cholesterol levels.  She experiences swelling in one foot, which she attributes to her amlodipine  medication. The swelling worsens with prolonged standing and improves  with rest. She drinks at least three bottles of water daily and is advised to increase her intake, especially on hot days or when exercising.  Her past medical history includes prediabetes with a recent hemoglobin A1c of 6.1%, indicating a prediabetes range. She is not currently taking Linzess daily but uses it as needed. She takes omeprazole  and has refills available.  Family history is significant for breast cancer in her mother. She has regular mammograms and is considering further evaluation for breast cancer risk due to her family history.   Hypertension This is a new problem. The current episode started more than 1 month ago. The problem has been gradually improving since onset. Pertinent negatives include no blurred vision, chest pain, palpitations or shortness of breath. Risk factors for coronary artery disease include dyslipidemia and obesity. Past treatments include calcium  channel blockers. The current treatment provides moderate improvement. There is no history of coarctation of the aorta or hyperaldosteronism.     Past Medical History:  Diagnosis Date   Abdominal pain    Allergy     Allergy -induced asthma    Angina 05/2011   NO HEART PROBLEM WENT TO ED 05/22/11 FOR CP STRESS TEST WAS NEGATIVE    Ankle fracture    Aortic atherosclerosis (HCC) 09/01/2021   Back pain    Chronic back pain greater than 3 months duration 01/17/2011   injured lumbar and posterior cervical neck in fall    Constipation    DDD (degenerative disc disease), cervical    Depression    Diverticulitis  Edema of both lower extremities    Gallbladder problem    GERD (gastroesophageal reflux disease)    H/O hiatal hernia    Hyperlipidemia    IBS (irritable bowel syndrome)    Joint pain    Nausea    Neuromuscular disorder (HCC)    LUMBA BACK  TROUBLE    Pre-diabetes    Rash    UNDER BIL BREASTS   Sleep apnea    SOB (shortness of breath)    Vitamin B 12 deficiency    Vitamin D  deficiency    Wrist  fracture      Family History  Problem Relation Age of Onset   Diabetes Mother    Hyperlipidemia Mother    Cancer Mother    Sleep apnea Mother    Obesity Mother    Heart attack Father    Cancer Father        kidney and prostate   Hyperlipidemia Father    Other Father        gout and clogged arteries   Hypertension Father    Kidney disease Father    Alcoholism Father    Renal Disease Father    Hyperlipidemia Sister    Diabetes Sister    Diabetes Brother    Hyperlipidemia Brother    Coronary artery disease Brother    Heart disease Maternal Grandmother      Current Outpatient Medications:    albuterol  (VENTOLIN  HFA) 108 (90 Base) MCG/ACT inhaler, Inhale 1-2 puffs into the lungs every 6 (six) hours as needed., Disp: 1 each, Rfl: 0   atorvastatin  (LIPITOR) 80 MG tablet, One tab po three days weekly, Disp: 36 tablet, Rfl: 3   benzonatate  (TESSALON  PERLES) 100 MG capsule, Take 1 capsule (100 mg total) by mouth 3 (three) times daily as needed., Disp: 30 capsule, Rfl: 0   butalbital -acetaminophen -caffeine  (FIORICET) 50-325-40 MG tablet, One tab po q8h prn headache, Disp: 20 tablet, Rfl: 0   cetirizine  (ZYRTEC  ALLERGY ) 10 MG tablet, Take 1 tablet (10 mg total) by mouth daily., Disp: 30 tablet, Rfl: 0   cyclobenzaprine  (FLEXERIL ) 5 MG tablet, Take 1 tablet (5 mg total) by mouth 3 (three) times daily as needed for muscle spasms., Disp: 30 tablet, Rfl: 0   fluticasone  (FLONASE ) 50 MCG/ACT nasal spray, Place 1 spray into both nostrils daily., Disp: 15.8 mL, Rfl: 1   ketoconazole  (NIZORAL ) 2 % cream, Apply 1 Application topically daily., Disp: 60 g, Rfl: 0   linaclotide (LINZESS) 290 MCG CAPS capsule, 1 cap(s) orally 30 minutes before the first meal of the day for 90 days, Disp: , Rfl:    omeprazole  (PRILOSEC) 40 MG capsule, Take 1 capsule (40 mg total) by mouth daily. Take 30 minutes prior to breakfast., Disp: 30 capsule, Rfl: 3   triamcinolone  cream (KENALOG ) 0.1 %, APPLY TO AFFECTED AREA  TWICE DAILY AS NEEDED, Disp: 30 g, Rfl: 0   amLODipine  (NORVASC ) 2.5 MG tablet, Take 1 tablet (2.5 mg total) by mouth daily., Disp: 90 tablet, Rfl: 2   nystatin  powder, Apply 1 Application topically 2 (two) times daily as needed., Disp: 45 g, Rfl: 1   Allergies  Allergen Reactions   Amoxicillin Hives   Sulfa Antibiotics Hives   Penicillins Hives and Itching    Has patient had a PCN reaction causing immediate rash, facial/tongue/throat swelling, SOB or lightheadedness with hypotension: Yes Has patient had a PCN reaction causing severe rash involving mucus membranes or skin necrosis: No Has patient had a PCN reaction that  required hospitalization No Has patient had a PCN reaction occurring within the last 10 years: Yes If all of the above answers are NO, then may proceed with Cephalosporin use.    Tape     PLASTIC TAPE   (WHELPS, TORE SKIN)   Tramadol Hives     Review of Systems  Constitutional: Negative.   Eyes:  Negative for blurred vision.  Respiratory: Negative.  Negative for shortness of breath.   Cardiovascular: Negative.  Negative for chest pain and palpitations.  Gastrointestinal: Negative.   Neurological: Negative.   Psychiatric/Behavioral: Negative.       Today's Vitals   09/05/23 0922  BP: 118/82  Pulse: 75  Temp: 98.3 F (36.8 C)  SpO2: 98%  Weight: 275 lb (124.7 kg)  Height: 5' 8 (1.727 m)   Body mass index is 41.81 kg/m.  Wt Readings from Last 3 Encounters:  09/07/23 277 lb 6.4 oz (125.8 kg)  09/05/23 275 lb (124.7 kg)  09/03/23 275 lb (124.7 kg)     Objective:  Physical Exam Vitals and nursing note reviewed.  Constitutional:      Appearance: Normal appearance. She is obese.  HENT:     Head: Normocephalic and atraumatic.  Eyes:     Extraocular Movements: Extraocular movements intact.  Cardiovascular:     Rate and Rhythm: Normal rate and regular rhythm.     Heart sounds: Normal heart sounds.  Pulmonary:     Effort: Pulmonary effort is normal.      Breath sounds: Normal breath sounds.  Musculoskeletal:     Cervical back: Normal range of motion.  Skin:    General: Skin is warm.  Neurological:     General: No focal deficit present.     Mental Status: She is alert.  Psychiatric:        Mood and Affect: Mood normal.        Behavior: Behavior normal.         Assessment And Plan:  Hypertensive heart disease without heart failure Assessment & Plan: Chronic, controlled.  Blood pressure controlled at 118/82 mmHg. Target <120/80 mmHg. On antihypertensive medication. - Refill antihypertensive medication for 90 days. - continue amlodipine  2.5mg  daily - Encourage increased exercise for blood pressure management.   Aortic atherosclerosis (HCC) Assessment & Plan: Seen on coronary CTA. LDL goal is less than 70. Agrees to try statin therapy three days weekly.    Pure hypercholesterolemia Assessment & Plan: LDL cholesterol elevated at 140 mg/dL. Target <70 mg/dL due to aortic plaque and genetic predisposition. Side effects from current medication. - Increase dose of cholesterol medication, maintain three-day-a-week schedule. - Send new prescription for higher dose to pharmacy. - Recheck cholesterol levels in 12 weeks.   Prediabetes Assessment & Plan: Hemoglobin A1c at 6.1%. Goal: 10% body weight loss. - Encourage 10% body weight loss. - Promote increased physical activity, aim for more than two days a week.   Class 3 severe obesity due to excess calories with serious comorbidity and body mass index (BMI) of 40.0 to 44.9 in adult Assessment & Plan: Chronic, BMI 41. She is now followed by [facility], their input is appreciated. Importance of adequate protein intake and regular exercise was discussed with the patient.    Family history of breast cancer Assessment & Plan: Family history of breast cancer. Due for mammogram. Consider breast MRI and high-risk clinic referral. - Consider referral to high-risk breast cancer clinic  for evaluation and potential MRI.   Other orders -  Atorvastatin  Calcium ; One tab po three days weekly  Dispense: 36 tablet; Refill: 3 -     amLODIPine  Besylate; Take 1 tablet (2.5 mg total) by mouth daily.  Dispense: 90 tablet; Refill: 2  General Health Maintenance Advised on lifestyle modifications for overall health. Monitor foot swelling related to medication or sodium intake. - Advise wearing compression hose when standing long periods. - Increase water intake, especially on hot days and when exercising. - Monitor dietary triggers of foot swelling, such as high sodium intake.  Return if symptoms worsen or fail to improve.  Patient was given opportunity to ask questions. Patient verbalized understanding of the plan and was able to repeat key elements of the plan. All questions were answered to their satisfaction.   I, Catheryn LOISE Slocumb, MD, have reviewed all documentation for this visit. The documentation on 09/05/23 for the exam, diagnosis, procedures, and orders are all accurate and complete.   IF YOU HAVE BEEN REFERRED TO A SPECIALIST, IT MAY TAKE 1-2 WEEKS TO SCHEDULE/PROCESS THE REFERRAL. IF YOU HAVE NOT HEARD FROM US /SPECIALIST IN TWO WEEKS, PLEASE GIVE US  A CALL AT [phone].   THE PATIENT IS ENCOURAGED TO PRACTICE SOCIAL DISTANCING DUE TO THE COVID-19 PANDEMIC.

## 2023-09-05 ENCOUNTER — Encounter: Payer: Self-pay | Admitting: Internal Medicine

## 2023-09-05 ENCOUNTER — Other Ambulatory Visit: Payer: Self-pay

## 2023-09-05 ENCOUNTER — Ambulatory Visit (INDEPENDENT_AMBULATORY_CARE_PROVIDER_SITE_OTHER): Payer: Self-pay | Admitting: Internal Medicine

## 2023-09-05 VITALS — BP 118/82 | HR 75 | Temp 98.3°F | Ht 68.0 in | Wt 275.0 lb

## 2023-09-05 DIAGNOSIS — Z803 Family history of malignant neoplasm of breast: Secondary | ICD-10-CM

## 2023-09-05 DIAGNOSIS — I700 Atherosclerosis of aorta: Secondary | ICD-10-CM

## 2023-09-05 DIAGNOSIS — I119 Hypertensive heart disease without heart failure: Secondary | ICD-10-CM | POA: Diagnosis not present

## 2023-09-05 DIAGNOSIS — E7800 Pure hypercholesterolemia, unspecified: Secondary | ICD-10-CM

## 2023-09-05 DIAGNOSIS — I10 Essential (primary) hypertension: Secondary | ICD-10-CM

## 2023-09-05 DIAGNOSIS — Z6841 Body Mass Index (BMI) 40.0 and over, adult: Secondary | ICD-10-CM

## 2023-09-05 DIAGNOSIS — E66813 Obesity, class 3: Secondary | ICD-10-CM

## 2023-09-05 DIAGNOSIS — R7303 Prediabetes: Secondary | ICD-10-CM | POA: Diagnosis not present

## 2023-09-05 DIAGNOSIS — L304 Erythema intertrigo: Secondary | ICD-10-CM

## 2023-09-05 MED ORDER — ATORVASTATIN CALCIUM 80 MG PO TABS: ORAL_TABLET | ORAL | 3 refills | Status: DC

## 2023-09-05 MED ORDER — AMLODIPINE BESYLATE 2.5 MG PO TABS: 2.5000 mg | ORAL_TABLET | Freq: Every day | ORAL | 2 refills | Status: DC

## 2023-09-05 MED ORDER — NYSTATIN 100000 UNIT/GM EX POWD: 1.0000 | Freq: Two times a day (BID) | CUTANEOUS | 1 refills | Status: DC | PRN

## 2023-09-05 NOTE — Progress Notes (Unsigned)
  Cardiology Office Note   Date:  09/05/2023  ID:  [name], DOB 1970/09/27, MRN [id number] PCP: [name], MD  [facility] Providers Cardiologist:  Annabella Scarce, MD {    History of Present Illness [name] is a 53 y.o. female with a past medical history of hyperlipidemia, GERD, OSA on CPAP, aortic atherosclerosis, prediabetes who is here for follow-up appointment.  Was last seen by Annabella Scarce, MD 09/01/2021.  She was first seen for chest pain in March 2023 at the request of her PCP.  EKG was unremarkable at that time.  Given her cardiac risk factors she was referred to cardiology.  Also referred for sleep study due to snoring and PND.  Reported palpitations when laying down.  Had a coronary CTA 4/23 with no CAD.  Did have some aortic atherosclerosis.  Wore monitor with up to 5 beats of SVT and rare PACs/PVCs.  When she was last seen she was concerned about CPAP and oxygen levels.  Reported feeling occasional extra beats.  Had been going to therapy to manage her breathing.  She can tell the difference when she wears her CPAP.  Had lost about 85 pounds by changing her diet and exercising.  No longer was eating dairy products.  When taking Crestor she experiencing symptoms of muscle aches.  Remains compliant with medication and supplements.  Of note, her father passed away last year and has been going through counseling sessions to manage (2022).  Her mother had pancreatic cancer.  Patient denied chest pain, shortness of breath, nocturnal dyspnea, orthopnea, or peripheral edema at that time.  No lightheadedness or syncope.  Today, she***  ROS: ***  Studies Reviewed      *** Risk Assessment/Calculations {Does this patient have ATRIAL FIBRILLATION?[phone]}         Physical Exam VS:  LMP 11/05/2015 Comment: tubal ligation       Wt Readings from Last 3 Encounters:  09/05/23 275 lb (124.7 kg)  09/03/23 275 lb (124.7 kg)  07/06/23 272 lb (123.4  kg)    GEN: Well nourished, well developed in no acute distress NECK: No JVD; No carotid bruits CARDIAC: ***RRR, no murmurs, rubs, gallops RESPIRATORY:  Clear to auscultation without rales, wheezing or rhonchi  ABDOMEN: Soft, non-tender, non-distended EXTREMITIES:  No edema; No deformity   ASSESSMENT AND PLAN Aortic atherosclerosis Sleep apnea Atypical chest pain Hyperlipidemia    {Are you ordering a CV Procedure (e.g. stress test, cath, DCCV, TEE, etc)?   Press F2        :[id number]}  Dispo: ***  Signed, Orren LOISE Fabry, [name]

## 2023-09-06 ENCOUNTER — Telehealth: Payer: Self-pay | Admitting: Pharmacist

## 2023-09-06 DIAGNOSIS — E7800 Pure hypercholesterolemia, unspecified: Secondary | ICD-10-CM

## 2023-09-06 NOTE — Progress Notes (Signed)
   09/06/2023  Patient ID: [name], female   DOB: [birth year], 53 y.o.   MRN: [id number]  Called Patient to follow up on amlodipine  and atorvastatin  refills. Unfortunately, she did not answer the phone. HIPAA compliant message was left on her voicemail.  Atorvastatin  and Amlodipine  were sent to the Patient's pharmacy on 09/05/2023.  She saw Dr. Jarold yesterday.  Plan:  Follow up with Patient in 3 weeks.   Cassius DOROTHA Brought, PharmD, BCACP Clinical Pharmacist [phone]

## 2023-09-07 ENCOUNTER — Encounter: Payer: Self-pay | Admitting: Physician Assistant

## 2023-09-07 ENCOUNTER — Ambulatory Visit: Attending: Physician Assistant | Admitting: Physician Assistant

## 2023-09-07 VITALS — BP 132/82 | HR 75 | Ht 68.0 in | Wt 277.4 lb

## 2023-09-07 DIAGNOSIS — G4733 Obstructive sleep apnea (adult) (pediatric): Secondary | ICD-10-CM

## 2023-09-07 DIAGNOSIS — E7800 Pure hypercholesterolemia, unspecified: Secondary | ICD-10-CM

## 2023-09-07 DIAGNOSIS — R0789 Other chest pain: Secondary | ICD-10-CM | POA: Diagnosis not present

## 2023-09-07 DIAGNOSIS — I700 Atherosclerosis of aorta: Secondary | ICD-10-CM

## 2023-09-07 NOTE — Patient Instructions (Addendum)
 Medication Instructions:  No medication changes were made during today's visit.  *If you need a refill on your cardiac medications before your next appointment, please call your pharmacy*   Lab Work: Remember to send a copy of your labs once done with your PCP. If you have labs (blood work) drawn today and your tests are completely normal, you will receive your results only by: MyChart Message (if you have MyChart) OR A paper copy in the mail If you have any lab test that is abnormal or we need to change your treatment, we will call you to review the results.   Testing/Procedures: .No procedures were ordered during today's visit.    Follow-Up: At [facility], you and your health needs are our priority.  As part of our continuing mission to provide you with exceptional heart care, we have created designated Provider Care Teams.  These Care Teams include your primary Cardiologist (physician) and Advanced Practice Providers (APPs -  Physician Assistants and Nurse Practitioners) who all work together to provide you with the care you need, when you need it.  We recommend signing up for the patient portal called MyChart.  Sign up information is provided on this After Visit Summary.  MyChart is used to connect with patients for Virtual Visits (Telemedicine).  Patients are able to view lab/test results, encounter notes, upcoming appointments, etc.  Non-urgent messages can be sent to your provider as well.   To learn more about what you can do with MyChart, go to [email or web address].    Your next appointment:   6 month(s)  Provider:   Annabella Scarce, MD    Other Instructions  [employer]  Outlet Store  Performance Legwear for [facility] Mailing Address:  [address];   [address]  [place], [place] [address]  Tel [phone] Fx [phone]     High Quality Legwear for Today's Active Lifestyles Maximum Compression at the ankle. Compression  lessens gradually up the leg.   We manufacture a wide range of compression hosiery for men and women in  different styles, constructions and levels of support.  How Compression Hosiery Works Regulatory affairs officer, [employer]. compression hosiery works by applying graduated pressure to the  muscles and veins in the legs.  When the calf muscle contracts such as during walking  the compression hosiery will "give" and then return to its original position. By doing so  the hosiery is assists your body's circulatory wellness.  The result is increased leg health and vitality.   Maximum Compression at the ankle Compression lessens gradually up the leg  We Offer: Sheer & Opaque Stockings       COLORS:  Nude, black, white and misc. prints Below Knee Thigh High Pantyhose  High Quality Legwear for Today's Active Lifestyles We manufacture a wide range of compression hosiery for men and women in different styles, construction sand levels of support.  Socks:                     Sheer & Opaque   Compression Levels Include:                                  Stockings Men's               Below Knee                8-15 mmHg  Women's         Thigh High                 15-20 mmHg  Unisex             Pantyhose                 20-30 mmHg                                                                      30-40 mmHg  4 Simple Ways to Order   Email  [email or web address] Mail/Email orders are subject to processing and handling charges. Allow 7-10 days for receipt.  Phone [phone]  Please allow 24 hours for return call.   In Person  We recommend calling prior to your visit to confirm store hours as they may change due to holiday, weather, and maintenance.   By Mail When placing an order, please have the following information available. Our representatives are available to assist.     Measurements    THIGH      in.   CALF        in.   ANKLE     in.    Compression  8-15 mmHg 15-20 mmHg**   20-30 mmHg  30-40 mmHg   WOMEN'S MEN'S  Shoe Size Sock Size Shoe Size Sock Size  4 - 5 Small 7.5 and Under Small  5.5 - 7.5 Medium 8 - 10 Medium  8 - 10 Large 10.5 - 12 Large  10.5 and Over X-Large 12.5 and Over X-Large   Knee High Size Chart  Length from CALF MEASUREMENT  floor to bend   in knee. 11 12 13 14 15 16 17 18 19 20  21" 22"  14 S S S S M M L L L XL XL XL  15 S S S M M L L L XL XL XXL XXL  16 S S M M M L L L XL XXL XXL XXL  17 S M M M M L L XL XL XXL XXL XXL  18 M M M M L L L XL XL XXL XXL XXL  19 M M M M L L XL XL XL XXL XXL XXL   Thigh High Circumference Sizing Chart                 S M L XL XXL  ANKLE 6.5 - 8 8 - 9.5 9.5 - 11 11 - 12.5 12.5 - 14  CALF 10.5 - 14.5 11.5 - 15.5 12.5 - 17 13.5 - 17.5 14.5 - 19.5  THIGH 15.5 - 22 17.5 - 24 19.5 - 26 22 - 28 26 - 32  HIP UP TO 40 UP TO 44 UP TO 48' UP TO 52 UP TO 56   Pantyhose Size Chart  Height Petite Medium Tall X-Tall Queen Queen +   Weight Weight Weight Weight Weight Weight  4'11 95-130 135      5'0 95-125 130-145   170-185   5'1 90-120 125-155 160-165  170-195   5'2 90-115 120-145 150-165  170-195   5'3 90-110 115-140 145-165  170-200 200-225  5'4 100-105 110-135 140-160 165 170-200 200-225  5'5 100  105-130 135-160 165 170-200 200-225  5'6  110-125 130-155 160-165 170-200 200-225  5'7  110-120 125-150 155-165 170-200 195-225  5'8   120-145 150-165 170-200 190-225  5'9   125-140 145-170 175-190 185-220  5'10   125-135 140-185  185-215  5'11   130-135 140-185  190-210        DATE TIME BP HR COMMENT DATE  TIME  BP HR COMMENT                                                                                                                                                                                                                                                                     DATE TIME BP HR COMMENT DATE  TIME  BP HR COMMENT  DATE TIME BP HR COMMENT DATE  TIME  BP HR COMMENT

## 2023-09-16 DIAGNOSIS — Z803 Family history of malignant neoplasm of breast: Secondary | ICD-10-CM | POA: Insufficient documentation

## 2023-09-16 NOTE — Assessment & Plan Note (Addendum)
 Seen on coronary CTA. LDL goal is less than 70. Agrees to try statin therapy three days weekly.

## 2023-09-16 NOTE — Assessment & Plan Note (Signed)
 Chronic, BMI 41. She is now followed by [facility] clinic, their input is appreciated. Importance of adequate protein intake and regular exercise was discussed with the patient.

## 2023-09-16 NOTE — Assessment & Plan Note (Signed)
 Hemoglobin A1c at 6.1%. Goal: 10% body weight loss. - Encourage 10% body weight loss. - Promote increased physical activity, aim for more than two days a week.

## 2023-09-16 NOTE — Assessment & Plan Note (Signed)
 Family history of breast cancer. Due for mammogram. Consider breast MRI and high-risk clinic referral. - Consider referral to high-risk breast cancer clinic for evaluation and potential MRI.

## 2023-09-16 NOTE — Assessment & Plan Note (Signed)
 LDL cholesterol elevated at 140 mg/dL. Target <70 mg/dL due to aortic plaque and genetic predisposition. Side effects from current medication. - Increase dose of cholesterol medication, maintain three-day-a-week schedule. - Send new prescription for higher dose to pharmacy. - Recheck cholesterol levels in 12 weeks.

## 2023-09-16 NOTE — Assessment & Plan Note (Signed)
 Chronic, controlled.  Blood pressure controlled at 118/82 mmHg. Target <120/80 mmHg. On antihypertensive medication. - Refill antihypertensive medication for 90 days. - continue amlodipine  2.5mg  daily - Encourage increased exercise for blood pressure management.

## 2023-09-17 ENCOUNTER — Encounter (INDEPENDENT_AMBULATORY_CARE_PROVIDER_SITE_OTHER): Payer: Self-pay | Admitting: Internal Medicine

## 2023-09-17 ENCOUNTER — Ambulatory Visit (INDEPENDENT_AMBULATORY_CARE_PROVIDER_SITE_OTHER): Admitting: Internal Medicine

## 2023-09-17 VITALS — BP 131/84 | HR 81 | Temp 98.3°F | Ht 68.0 in | Wt 272.0 lb

## 2023-09-17 DIAGNOSIS — R7303 Prediabetes: Secondary | ICD-10-CM

## 2023-09-17 DIAGNOSIS — I10 Essential (primary) hypertension: Secondary | ICD-10-CM

## 2023-09-17 DIAGNOSIS — K760 Fatty (change of) liver, not elsewhere classified: Secondary | ICD-10-CM

## 2023-09-17 DIAGNOSIS — G4733 Obstructive sleep apnea (adult) (pediatric): Secondary | ICD-10-CM | POA: Diagnosis not present

## 2023-09-17 DIAGNOSIS — E559 Vitamin D deficiency, unspecified: Secondary | ICD-10-CM | POA: Diagnosis not present

## 2023-09-17 DIAGNOSIS — Z6841 Body Mass Index (BMI) 40.0 and over, adult: Secondary | ICD-10-CM

## 2023-09-17 DIAGNOSIS — E7800 Pure hypercholesterolemia, unspecified: Secondary | ICD-10-CM

## 2023-09-17 DIAGNOSIS — E66813 Obesity, class 3: Secondary | ICD-10-CM

## 2023-09-17 NOTE — Progress Notes (Unsigned)
 Office: [phone]  /  Fax: [phone]  Weight Summary and Body Composition Analysis (BIA)  Vitals Temp: 98.3 F (36.8 C) BP: 131/84 Pulse Rate: 81 SpO2: 95 %   Anthropometric Measurements Height: 5' 8 (1.727 m) Weight: 272 lb (123.4 kg) BMI (Calculated): 41.37 Weight at Last Visit: 275 lb Weight Lost Since Last Visit: 3 lb Weight Gained Since Last Visit: 0 lb Starting Weight: 275 lb Total Weight Loss (lbs): 3 lb (1.361 kg) Peak Weight: 285 lb   Body Composition  Body Fat %: 48.9 % Fat Mass (lbs): 133.4 lbs Muscle Mass (lbs): 132.4 lbs Total Body Water (lbs): 96 lbs Visceral Fat Rating : 15    RMR: 1829  Today's Visit #: 2  Starting Date: 09/03/23   Subjective   Chief Complaint: Obesity  Interval History Discussed the use of AI scribe software for clinical note transcription with the patient, who gave verbal consent to proceed.  History of Present Illness   [name] is a 53 year old female with sleep apnea, hyperlipidemia, and prediabetes who presents for a post intake follow-up for weight management.   She has lost three pounds since her last visit and is following a 1200 calorie nutrition plan with suboptimal adherence. She feels hungry due to the calorie deficit and struggles with cravings for sweets, describing herself as a 'sweet type person'. She is attempting to find healthier alternatives, such as using sugar substitutes and incorporating more fruits and vegetables into her diet.  Her recent blood work from March and July shows a fasting blood sugar of 90, indicating normal levels, but an LDL cholesterol of 140, which is high. She also has a vitamin D  deficiency. Her A1c is 6.1, indicating prediabetes, and her insulin  levels are high at 20.9, which is three times the normal level.  She has a history of vitamin D  deficiency, which improved previously but has since worsened. She is currently taking medication for high cholesterol  and blood pressure.  She has a history of fatty liver, identified via ultrasound in 2010, but her current risk of fibrosis is low.  She has not been exercising due to the heat and breathing issues, but she enjoys walking outside and line dancing.       Challenges affecting patient progress: strong hunger signals and/or impaired satiety / inhibitory control and low volume of physical activity at present .    Pharmacotherapy for weight management: She is currently taking no anti-obesity medication.   Assessment and Plan   Treatment Plan For Obesity:  Recommended Dietary Goals  [name] is currently in the action stage of change. As such, her goal is to continue weight management plan. She has agreed to: follow tailored, multi-day,whole foods, low-carbohydrate, high protein plan targeting 1300 calories and 90-120 grams of protein per day  Behavioral Health and Counseling  We discussed the following behavioral modification strategies today: continue to work on maintaining a reduced calorie state, getting the recommended amount of protein, incorporating whole foods, making healthy choices, staying well hydrated and practicing mindfulness when eating..  Additional education and resources provided today: Handout and personalized instruction on tracking and journaling using Apps  Recommended Physical Activity Goals  [name] has been advised to work up to 150 minutes of moderate intensity aerobic activity a week and strengthening exercises 2-3 times per week for cardiovascular health, weight loss maintenance and preservation of muscle mass.   She has agreed to :  Men  Medical Interventions and Pharmacotherapy  We discussed  various medication options to help [name] with her weight loss efforts and we both agreed to : Continue with current nutritional and behavioral strategies and Has declined pharmacotherapy  Associated Conditions Impacted by Obesity Treatment  Assessment & Plan Vitamin  D deficiency Most recent vitamin D  levels  Lab Results  Component Value Date   VD25OH 23.9 (L) 09/03/2023   VD25OH 28.3 (L) 10/26/2020   VD25OH 44.3 04/17/2019     Deficiency state associated with adiposity and may result in leptin resistance, weight gain and fatigue.   Plan: After discussion of benefits, alternative treatment options and side effects patient will be started on vitamin D2 50,000 units 1 tablet weekly for 3-4 months. for a treatment goal level of 50-60 mg/dl. Check levels at that time for response monitoring.  OSA on CPAP On CPAP with reported good compliance. Continue PAP therapy. Losing 15% or more of body weight may improve AHI.  We discussed how sleep apnea may affect weight loss due to disruption of energy regulation system.  Prediabetes Most recent A1c is  Lab Results  Component Value Date   HGBA1C 6.1 (H) 09/03/2023   HGBA1C 5.7 (H) 12/17/2017    Patient aware of disease state and risk of progression. This may contribute to abnormal cravings, fatigue and diabetic complications without having diabetes.   We have discussed treatment options which include: losing 7 to 10% of body weight, increasing physical activity to a goal of 150 minutes a week at moderate intensity.  Advised to maintain a diet low on simple and processed carbohydrates.  She may also be a candidate for pharmacoprophylaxis with metformin or incretin mimetic.   A1c is 6.1%, indicating prediabetes. Insulin  levels are elevated at 20.9, which is three times the normal level. Explained the relationship between insulin , weight gain, and metabolism, and emphasized the importance of reducing carbohydrate intake to manage insulin  levels and prevent progression to diabetes. Discussed the potential benefits of metformin, including a 30% reduction in the risk of progressing to diabetes, but she prefers to focus on lifestyle changes at this time. - Focus on reducing carbohydrate intake - Increase physical  activity to improve insulin  sensitivity  Class 3 severe obesity with serious comorbidity and body mass index (BMI) of 40.0 to 44.9 in adult, unspecified obesity type She has lost three pounds since the last visit, following a 1200-calorie nutrition plan with suboptimal adherence and no exercise. Reports feeling hungry due to the calorie deficit. Discussed the importance of choosing wisely within the calorie limit and provided guidance on modifying the diet to include more fruits, vegetables, and whole grains while reducing simple carbohydrates and sugary foods. Emphasized the role of physical activity in improving insulin  resistance and overall health. Explained that increasing protein intake can suppress appetite and that a 1300-calorie plan may be more sustainable. Discussed the use of AI tools for meal planning and tracking apps for calorie management. - Continue 1200-1300 calorie nutrition plan with focus on high protein and fiber intake - Increase intake of fruits and vegetables - Reduce intake of simple carbohydrates and sugary foods - Encourage physical activity, such as walking or using YouTube exercise videos - Use AI tools like ChatGPT for meal planning - Consider using tracking apps like MyFitnessPal for calorie tracking Primary hypertension Blood pressure improving now on amlodipine .  Continue current regimen most recent renal parameters show normal kidney function.  Losing 10% of body weight will improve blood pressure control         Pure hypercholesterolemia She  has high LDL cholesterol at 140 mg/dL, which should be less than 100 mg/dL. High LDL cholesterol increases the risk of heart disease and stroke. Dietary modifications, including reducing sugars and saturated fats, were recommended to help lower LDL cholesterol levels. Discussed the importance of reducing processed meats and increasing intake of lean proteins and whole grains. - Continue cholesterol medication - Focus on  dietary changes to reduce LDL cholesterol, including reducing sugars and saturated fats Metabolic dysfunction-associated steatotic liver disease (MASLD) She has hepatic steatosis on ultrasound of the liver from 2010.  Most recent liver enzymes and platelet counts are normal.   Fibrosis 4 Score = 1.28 (Low risk)        Interpretation for patients with NAFLD          <1.30       -  F0-F1 (Low risk)          1.30-2.67 -  Indeterminate           >2.67      -  F3-F4 (High risk)     Validated for ages 34-65         Losing 15% of body weight may improve condition also reducing saturated fats simple and added sugar in diet.  Pharmacotherapy with GLP-1 may also be of benefit         Objective   Physical Exam:  Blood pressure 131/84, pulse 81, temperature 98.3 F (36.8 C), height 5' 8 (1.727 m), weight 272 lb (123.4 kg), last menstrual period 11/05/2015, SpO2 95%. Body mass index is 41.36 kg/m.  General: She is overweight, cooperative, alert, well developed, and in no acute distress. PSYCH: Has normal mood, affect and thought process.   HEENT: EOMI, sclerae are anicteric. Lungs: Normal breathing effort, no conversational dyspnea. Extremities: No edema.  Neurologic: No gross sensory or motor deficits. No tremors or fasciculations noted.    Diagnostic Data Reviewed:  BMET    Component Value Date/Time   NA 141 09/03/2023 0940   K 4.2 09/03/2023 0940   CL 102 09/03/2023 0940   CO2 23 09/03/2023 0940   GLUCOSE 90 09/03/2023 0940   GLUCOSE 145 (H) 06/13/2023 0249   BUN 12 09/03/2023 0940   CREATININE 0.68 09/03/2023 0940   CALCIUM  9.5 09/03/2023 0940   GFRNONAA >60 06/13/2023 0249   GFRAA 108 06/30/2020 0000   Lab Results  Component Value Date   HGBA1C 6.1 (H) 09/03/2023   HGBA1C 5.7 (H) 12/17/2017   Lab Results  Component Value Date   INSULIN  20.9 09/03/2023   INSULIN  7.8 12/17/2017   Lab Results  Component Value Date   TSH 2.960 09/03/2023   CBC    Component  Value Date/Time   WBC 3.8 09/03/2023 0940   WBC 5.6 06/13/2023 0249   RBC 4.97 09/03/2023 0940   RBC 5.14 (H) 06/13/2023 0249   HGB 13.1 09/03/2023 0940   HCT 41.5 09/03/2023 0940   PLT 219 09/03/2023 0940   MCV 84 09/03/2023 0940   MCH 26.4 (L) 09/03/2023 0940   MCH 26.7 06/13/2023 0249   MCHC 31.6 09/03/2023 0940   MCHC 32.8 06/13/2023 0249   RDW 13.7 09/03/2023 0940   Iron Studies    Component Value Date/Time   IRON 66 09/30/2019 0000   FERRITIN 91 09/30/2019 0000   Lipid Panel     Component Value Date/Time   CHOL 205 (H) 09/03/2023 0940   TRIG 105 09/03/2023 0940   HDL 46 09/03/2023 0940   CHOLHDL 4.9 (H)  05/22/2023 0926   LDLCALC 140 (H) 09/03/2023 0940   Hepatic Function Panel     Component Value Date/Time   PROT 7.0 09/03/2023 0940   ALBUMIN 4.5 09/03/2023 0940   AST 22 09/03/2023 0940   ALT 18 09/03/2023 0940   ALKPHOS 78 09/03/2023 0940   BILITOT 0.3 09/03/2023 0940   BILIDIR 0.1 06/30/2020 0000   BILIDIR 0.10 10/16/2019 0930      Component Value Date/Time   TSH 2.960 09/03/2023 0940   Nutritional Lab Results  Component Value Date   VD25OH 23.9 (L) 09/03/2023   VD25OH 28.3 (L) 10/26/2020   VD25OH 44.3 04/17/2019    Medications: Outpatient Encounter Medications as of 09/17/2023  Medication Sig Note   albuterol  (VENTOLIN  HFA) 108 (90 Base) MCG/ACT inhaler Inhale 1-2 puffs into the lungs every 6 (six) hours as needed.    amLODipine  (NORVASC ) 2.5 MG tablet Take 1 tablet (2.5 mg total) by mouth daily.    atorvastatin  (LIPITOR) 80 MG tablet One tab po three days weekly    benzonatate  (TESSALON  PERLES) 100 MG capsule Take 1 capsule (100 mg total) by mouth 3 (three) times daily as needed.    butalbital -acetaminophen -caffeine  (FIORICET) 50-325-40 MG tablet One tab po q8h prn headache    cetirizine  (ZYRTEC  ALLERGY ) 10 MG tablet Take 1 tablet (10 mg total) by mouth daily.    cyclobenzaprine  (FLEXERIL ) 5 MG tablet Take 1 tablet (5 mg total) by mouth 3  (three) times daily as needed for muscle spasms. 06/25/2023: AS NEEDED   fluticasone  (FLONASE ) 50 MCG/ACT nasal spray Place 1 spray into both nostrils daily.    ketoconazole  (NIZORAL ) 2 % cream Apply 1 Application topically daily.    linaclotide (LINZESS) 290 MCG CAPS capsule 1 cap(s) orally 30 minutes before the first meal of the day for 90 days    nystatin  powder Apply 1 Application topically 2 (two) times daily as needed.    omeprazole  (PRILOSEC) 40 MG capsule Take 1 capsule (40 mg total) by mouth daily. Take 30 minutes prior to breakfast.    triamcinolone  cream (KENALOG ) 0.1 % APPLY TO AFFECTED AREA TWICE DAILY AS NEEDED    Vitamin D , Ergocalciferol , (DRISDOL ) 1.25 MG (50000 UNIT) CAPS capsule Take 1 capsule (50,000 Units total) by mouth every 7 (seven) days.    No facility-administered encounter medications on file as of 09/17/2023.     Follow-Up   Return in about 3 weeks (around 10/08/2023) for For Weight Mangement with Dr. [name].SABRA She was informed of the importance of frequent follow up visits to maximize her success with intensive lifestyle modifications for her multiple health conditions.  Attestation Statement   Reviewed by clinician on day of visit: allergies, medications, problem list, medical history, surgical history, family history, social history, and previous encounter notes.   I have spent 47 minutes in the care of the patient today including: 3 minutes before the visit reviewing and preparing the chart. 37 minutes face-to-face assessing and reviewing listed medical problems as outlined in obesity care plan, providing nutritional and behavioral counseling on topics outlined in the obesity care plan, independently interpreting test results and goals of care, as described in assessment and plan, reviewing and discussing biometric information and progress, and ordering medications - see orders 7 minutes after the visit updating chart and documentation of  encounter.    Lucas [name], MD

## 2023-09-17 NOTE — Assessment & Plan Note (Signed)
 She has hepatic steatosis on ultrasound of the liver from 2010.  Most recent liver enzymes and platelet counts are normal.   Fibrosis 4 Score = 1.28 (Low risk)        Interpretation for patients with NAFLD          <1.30       -  F0-F1 (Low risk)          1.30-2.67 -  Indeterminate           >2.67      -  F3-F4 (High risk)     Validated for ages 60-65         Losing 15% of body weight may improve condition also reducing saturated fats simple and added sugar in diet.  Pharmacotherapy with GLP-1 may also be of benefit

## 2023-09-18 MED ORDER — VITAMIN D (ERGOCALCIFEROL) 1.25 MG (50000 UNIT) PO CAPS: 50000.0000 [IU] | ORAL_CAPSULE | ORAL | 0 refills | Status: DC

## 2023-09-18 NOTE — Assessment & Plan Note (Signed)
 Blood pressure improving now on amlodipine .  Continue current regimen most recent renal parameters show normal kidney function.  Losing 10% of body weight will improve blood pressure control

## 2023-09-18 NOTE — Assessment & Plan Note (Signed)
 On CPAP with reported good compliance. Continue PAP therapy. Losing 15% or more of body weight may improve AHI.  We discussed how sleep apnea may affect weight loss due to disruption of energy regulation system.

## 2023-09-18 NOTE — Assessment & Plan Note (Signed)
 She has high LDL cholesterol at 140 mg/dL, which should be less than 100 mg/dL. High LDL cholesterol increases the risk of heart disease and stroke. Dietary modifications, including reducing sugars and saturated fats, were recommended to help lower LDL cholesterol levels. Discussed the importance of reducing processed meats and increasing intake of lean proteins and whole grains. - Continue cholesterol medication - Focus on dietary changes to reduce LDL cholesterol, including reducing sugars and saturated fats

## 2023-09-18 NOTE — Assessment & Plan Note (Signed)
 Most recent A1c is  Lab Results  Component Value Date   HGBA1C 6.1 (H) 09/03/2023   HGBA1C 5.7 (H) 12/17/2017    Patient aware of disease state and risk of progression. This may contribute to abnormal cravings, fatigue and diabetic complications without having diabetes.   We have discussed treatment options which include: losing 7 to 10% of body weight, increasing physical activity to a goal of 150 minutes a week at moderate intensity.  Advised to maintain a diet low on simple and processed carbohydrates.  She may also be a candidate for pharmacoprophylaxis with metformin or incretin mimetic.   A1c is 6.1%, indicating prediabetes. Insulin  levels are elevated at 20.9, which is three times the normal level. Explained the relationship between insulin , weight gain, and metabolism, and emphasized the importance of reducing carbohydrate intake to manage insulin  levels and prevent progression to diabetes. Discussed the potential benefits of metformin, including a 30% reduction in the risk of progressing to diabetes, but she prefers to focus on lifestyle changes at this time. - Focus on reducing carbohydrate intake - Increase physical activity to improve insulin  sensitivity

## 2023-09-18 NOTE — Assessment & Plan Note (Signed)
 She has lost three pounds since the last visit, following a 1200-calorie nutrition plan with suboptimal adherence and no exercise. Reports feeling hungry due to the calorie deficit. Discussed the importance of choosing wisely within the calorie limit and provided guidance on modifying the diet to include more fruits, vegetables, and whole grains while reducing simple carbohydrates and sugary foods. Emphasized the role of physical activity in improving insulin  resistance and overall health. Explained that increasing protein intake can suppress appetite and that a 1300-calorie plan may be more sustainable. Discussed the use of AI tools for meal planning and tracking apps for calorie management. - Continue 1200-1300 calorie nutrition plan with focus on high protein and fiber intake - Increase intake of fruits and vegetables - Reduce intake of simple carbohydrates and sugary foods - Encourage physical activity, such as walking or using YouTube exercise videos - Use AI tools like ChatGPT for meal planning - Consider using tracking apps like MyFitnessPal for calorie tracking

## 2023-09-18 NOTE — Assessment & Plan Note (Signed)
 Most recent vitamin D  levels  Lab Results  Component Value Date   VD25OH 23.9 (L) 09/03/2023   VD25OH 28.3 (L) 10/26/2020   VD25OH 44.3 04/17/2019     Deficiency state associated with adiposity and may result in leptin resistance, weight gain and fatigue.   Plan: After discussion of benefits, alternative treatment options and side effects patient will be started on vitamin D2 50,000 units 1 tablet weekly for 3-4 months. for a treatment goal level of 50-60 mg/dl. Check levels at that time for response monitoring.

## 2023-09-28 ENCOUNTER — Telehealth: Payer: Self-pay | Admitting: Pharmacist

## 2023-09-28 NOTE — Progress Notes (Signed)
   09/28/2023  Patient ID: [name], female   DOB: [birth year], 53 y.o.   MRN: [id number]  Patient was called to follow up on lipids and blood pressure. Unfortunately, she did not answer her phone. HIPAA compliant message was left on her voicemail.  From review of pharmacy fill history Amlodipine  2.5 mg was filled 05/22/23, 08/15/23, and 09/13/23 all for 30 day supplies with a compliance percentage of 58%--90 day supply sent in on 09/05/23  Atorvastatin  80 mg 1 tablet three times weekly was filled 09/05/23 for an 84 day supply.  Upcoming PCP appointment 11/28/23  Plan: Call Patient back in 1 month.   Cassius DOROTHA Brought, PharmD, BCACP Clinical Pharmacist [phone]

## 2023-10-09 ENCOUNTER — Telehealth: Payer: Self-pay | Admitting: Pharmacist

## 2023-10-09 DIAGNOSIS — E7800 Pure hypercholesterolemia, unspecified: Secondary | ICD-10-CM

## 2023-10-09 NOTE — Progress Notes (Signed)
   10/09/2023  Patient ID: [name], female   DOB: 09/20/1970, 53 y.o.   MRN: [id number]  Patient was called to follow up. Unfortunately, she did not answer the phone.  HIPAA compliant message left on her voicemail.    LDL 140 mg/dl on 92/85/74 84 day supply of Atorvastatin  80 mg  filled 09/05/23 (Patient had been not taking--recently restarted)  Amlodipine  2.5 mg filled 0724/25 for a 30 day supply   Upcoming appointment with Dr. [name] (weight management) on 10/11/23  Upcoming appointment with Dr. Jarold (PCP) in October.  Plan:  Follow up with Patient in 1 month.  Request 90 day refill of Amlodipine .   Cassius DOROTHA Brought, PharmD, [facility] Clinical Pharmacist [phone]

## 2023-10-11 ENCOUNTER — Ambulatory Visit (INDEPENDENT_AMBULATORY_CARE_PROVIDER_SITE_OTHER): Admitting: Internal Medicine

## 2023-10-11 ENCOUNTER — Encounter (INDEPENDENT_AMBULATORY_CARE_PROVIDER_SITE_OTHER): Payer: Self-pay | Admitting: Internal Medicine

## 2023-10-11 VITALS — BP 133/87 | HR 75 | Temp 98.2°F | Ht 68.0 in | Wt 275.0 lb

## 2023-10-11 DIAGNOSIS — Z6841 Body Mass Index (BMI) 40.0 and over, adult: Secondary | ICD-10-CM

## 2023-10-11 DIAGNOSIS — R7303 Prediabetes: Secondary | ICD-10-CM

## 2023-10-11 DIAGNOSIS — G4733 Obstructive sleep apnea (adult) (pediatric): Secondary | ICD-10-CM | POA: Diagnosis not present

## 2023-10-11 DIAGNOSIS — E66813 Obesity, class 3: Secondary | ICD-10-CM | POA: Diagnosis not present

## 2023-10-11 DIAGNOSIS — I10 Essential (primary) hypertension: Secondary | ICD-10-CM

## 2023-10-11 NOTE — Progress Notes (Signed)
 Office: [phone]  /  Fax: [phone]  Weight Summary and Body Composition Analysis (BIA)  Vitals Temp: 98.2 F (36.8 C) BP: 133/87 Pulse Rate: 75 SpO2: 94 %   Anthropometric Measurements Height: 5' 8 (1.727 m) Weight: 275 lb (124.7 kg) BMI (Calculated): 41.82 Weight at Last Visit: 272 lb Weight Lost Since Last Visit: 0 lb Weight Gained Since Last Visit: 3 lb Starting Weight: 275 lb Total Weight Loss (lbs): 0 lb (0 kg) Peak Weight: 285 lb   Body Composition  Body Fat %: 49.9 % Fat Mass (lbs): 137.4 lbs Muscle Mass (lbs): 130.8 lbs Total Body Water (lbs): 96 lbs Visceral Fat Rating : 16    RMR: 1829  Today's Visit #: 3  Starting Date: 09/03/23   Subjective   Chief Complaint: Obesity  Interval History Discussed the use of AI scribe software for clinical note transcription with the patient, who gave verbal consent to proceed.  History of Present Illness   [name] is a 53 year old female with hypertension, hypertensive heart disease, sleep apnea, MASLD, hypercholesterolemia, and prediabetes who presents for medical weight management.  She has experienced a recent weight gain of three pounds and acknowledges challenges in adhering to her nutrition plan. She exercises about two days a week for fifteen minutes. Recent family stress, including the passing of her aunt and the anniversary of her nephew's death, has contributed to her difficulty in following her plan.  She has a history of sleep apnea and uses a CPAP machine regularly. A sleep study conducted in May 2023 showed an AHI of 5.9 per hour, with a REM AHI of 26.1.  She experiences acid reflux, particularly when eating late at night, which affects her sleep. She is not using her CPAP machine as recommended.  She is considering meal prepping with a friend to improve her dietary habits and is exploring the use of meal replacements to manage her evening meals, which she finds challenging  due to her schedule.       Challenges affecting patient progress: having difficulty focusing on healthy eating, low volume of physical activity at present , recent lapse in weight management plan due to work, travel, illness or family circumstances, all-or- none mindset, and demotivation.    Pharmacotherapy for weight management: She is currently taking no anti-obesity medication.   Assessment and Plan   Treatment Plan For Obesity:  Recommended Dietary Goals  Analissa is currently in the action stage of change. As such, her goal is to continue weight management plan. She has agreed to: portion control, balanced plate and making smarter food choices, such as increasing vegetables, protein intake and reducing simple carbohydrates and processed foods , incorporate prepackaged healthy meals for convenience, incorporate 1-2 meal replacements a day for convenience , and continue to work on implementation of reduced calorie nutrition plan (RCNP)  Behavioral Health and Counseling  We discussed the following behavioral modification strategies today: continue to work on maintaining a reduced calorie state, getting the recommended amount of protein, incorporating whole foods, making healthy choices, staying well hydrated and practicing mindfulness when eating. and educated on identification of saturated fats and how to read food labels today.  Patient also counseled on avoiding all or none mindset and to focus on her why  Additional education and resources provided today: None  Recommended Physical Activity Goals  Laelyn has been advised to work up to 150 minutes of moderate intensity aerobic activity a week and strengthening exercises 2-3 times per week  for cardiovascular health, weight loss maintenance and preservation of muscle mass.   She has agreed to :  Think about enjoyable ways to increase daily physical activity and overcoming barriers to exercise and Increase physical activity in their day  and reduce sedentary time (increase NEAT).  Medical Interventions and Pharmacotherapy  We discussed various medication options to help Sherriann with her weight loss efforts and we both agreed to : Continue with current nutritional and behavioral strategies and we discussed the role of antiobesity medications as an adjunct she needs to work on implementation of nutritional and behavioral strategies.  Associated Conditions Impacted by Obesity Treatment  Assessment & Plan Primary hypertension Blood pressure improving now on amlodipine .  Continue current regimen most recent renal parameters show normal kidney function.  Losing 10% of body weight will improve blood pressure control  OSA on CPAP On CPAP with reported good compliance. Continue PAP therapy. Losing 15% or more of body weight may improve AHI.  We discussed how sleep apnea may affect weight loss due to disruption of energy regulation system.  Prediabetes Most recent A1c is  Lab Results  Component Value Date   HGBA1C 6.1 (H) 09/03/2023   HGBA1C 5.7 (H) 12/17/2017    Patient aware of disease state and risk of progression. This may contribute to abnormal cravings, fatigue and diabetic complications without having diabetes.   We have discussed treatment options which include: losing 7 to 10% of body weight, increasing physical activity to a goal of 150 minutes a week at moderate intensity.  Advised to maintain a diet low on simple and processed carbohydrates.  She may also be a candidate for pharmacoprophylaxis with metformin or incretin mimetic.   A1c is 6.1%, indicating prediabetes. Insulin  levels are elevated at 20.9, which is three times the normal level. Explained the relationship between insulin , weight gain, and metabolism, and emphasized the importance of reducing carbohydrate intake to manage insulin  levels and prevent progression to diabetes.  - Declined metformin in the past - Focus on reducing carbohydrate intake -  Increase physical activity to improve insulin  sensitivity  Class 3 severe obesity with serious comorbidity and body mass index (BMI) of 40.0 to 44.9 in adult, unspecified obesity type Obesity with cardiometabolic complications (hypertension with hypertensive heart disease, obstructive sleep apnea, MASLD, hypercholesterolemia, prediabetes) Obesity with associated cardiometabolic complications including hypertension with hypertensive heart disease, obstructive sleep apnea, MASLD, hypercholesterolemia, and prediabetes. Weight management is crucial to improve these conditions. Discussed the complexity of weight management, emphasizing the role of genetics, environment, stress, sleep, and diet. Highlighted the importance of consistency in lifestyle changes rather than perfection. Emphasized the need to reduce processed foods, sugars, and saturated fats to improve prediabetes and fatty liver. Discussed the potential role of GLP-1 receptor agonists like Wegovy  for weight loss and improvement of cardiometabolic conditions, but noted the need for long-term use and potential insurance coverage issues. Emphasized the importance of lifestyle changes in conjunction with any pharmacotherapy. Discussed the benefits of a 10-15% weight loss in improving cardiometabolic conditions. - Encourage consistent lifestyle changes focusing on reducing processed foods, sugars, and saturated fats. - Consider GLP-1 receptor agonists for weight management if lifestyle changes are insufficient, with a detailed discussion on risks, benefits, and insurance coverage. - Encourage meal planning and preparation, possibly with a partner, to facilitate healthier eating habits. - Suggest using meal replacements for convenience, especially for difficult meals like dinner. - Encourage regular physical activity, even in small increments, to increase daily steps. - Schedule follow-up in one  month to assess progress and adjust the plan as needed.          Objective   Physical Exam:  Blood pressure 133/87, pulse 75, temperature 98.2 F (36.8 C), height 5' 8 (1.727 m), weight 275 lb (124.7 kg), last menstrual period 11/05/2015, SpO2 94%. Body mass index is 41.81 kg/m.  General: She is overweight, cooperative, alert, well developed, and in no acute distress. PSYCH: Has normal mood, affect and thought process.   HEENT: EOMI, sclerae are anicteric. Lungs: Normal breathing effort, no conversational dyspnea. Extremities: No edema.  Neurologic: No gross sensory or motor deficits. No tremors or fasciculations noted.    Diagnostic Data Reviewed:  BMET    Component Value Date/Time   NA 141 09/03/2023 0940   K 4.2 09/03/2023 0940   CL 102 09/03/2023 0940   CO2 23 09/03/2023 0940   GLUCOSE 90 09/03/2023 0940   GLUCOSE 145 (H) 06/13/2023 0249   BUN 12 09/03/2023 0940   CREATININE 0.68 09/03/2023 0940   CALCIUM  9.5 09/03/2023 0940   GFRNONAA >60 06/13/2023 0249   GFRAA 108 06/30/2020 0000   Lab Results  Component Value Date   HGBA1C 6.1 (H) 09/03/2023   HGBA1C 5.7 (H) 12/17/2017   Lab Results  Component Value Date   INSULIN  20.9 09/03/2023   INSULIN  7.8 12/17/2017   Lab Results  Component Value Date   TSH 2.960 09/03/2023   CBC    Component Value Date/Time   WBC 3.8 09/03/2023 0940   WBC 5.6 06/13/2023 0249   RBC 4.97 09/03/2023 0940   RBC 5.14 (H) 06/13/2023 0249   HGB 13.1 09/03/2023 0940   HCT 41.5 09/03/2023 0940   PLT 219 09/03/2023 0940   MCV 84 09/03/2023 0940   MCH 26.4 (L) 09/03/2023 0940   MCH 26.7 06/13/2023 0249   MCHC 31.6 09/03/2023 0940   MCHC 32.8 06/13/2023 0249   RDW 13.7 09/03/2023 0940   Iron Studies    Component Value Date/Time   IRON 66 09/30/2019 0000   FERRITIN 91 09/30/2019 0000   Lipid Panel     Component Value Date/Time   CHOL 205 (H) 09/03/2023 0940   TRIG 105 09/03/2023 0940   HDL 46 09/03/2023 0940   CHOLHDL 4.9 (H) 05/22/2023 0926   LDLCALC 140 (H) 09/03/2023 0940    Hepatic Function Panel     Component Value Date/Time   PROT 7.0 09/03/2023 0940   ALBUMIN 4.5 09/03/2023 0940   AST 22 09/03/2023 0940   ALT 18 09/03/2023 0940   ALKPHOS 78 09/03/2023 0940   BILITOT 0.3 09/03/2023 0940   BILIDIR 0.1 06/30/2020 0000   BILIDIR 0.10 10/16/2019 0930      Component Value Date/Time   TSH 2.960 09/03/2023 0940   Nutritional Lab Results  Component Value Date   VD25OH 23.9 (L) 09/03/2023   VD25OH 28.3 (L) 10/26/2020   VD25OH 44.3 04/17/2019    Medications: Outpatient Encounter Medications as of 10/11/2023  Medication Sig Note   albuterol  (VENTOLIN  HFA) 108 (90 Base) MCG/ACT inhaler Inhale 1-2 puffs into the lungs every 6 (six) hours as needed.    amLODipine  (NORVASC ) 2.5 MG tablet Take 1 tablet (2.5 mg total) by mouth daily.    atorvastatin  (LIPITOR) 80 MG tablet One tab po three days weekly    benzonatate  (TESSALON  PERLES) 100 MG capsule Take 1 capsule (100 mg total) by mouth 3 (three) times daily as needed.    butalbital -acetaminophen -caffeine  (FIORICET) 50-325-40 MG tablet One tab po q8h prn  headache    cetirizine  (ZYRTEC  ALLERGY ) 10 MG tablet Take 1 tablet (10 mg total) by mouth daily.    cyclobenzaprine  (FLEXERIL ) 5 MG tablet Take 1 tablet (5 mg total) by mouth 3 (three) times daily as needed for muscle spasms. 06/25/2023: AS NEEDED   fluticasone  (FLONASE ) 50 MCG/ACT nasal spray Place 1 spray into both nostrils daily.    ketoconazole  (NIZORAL ) 2 % cream Apply 1 Application topically daily.    linaclotide (LINZESS) 290 MCG CAPS capsule 1 cap(s) orally 30 minutes before the first meal of the day for 90 days    nystatin  powder Apply 1 Application topically 2 (two) times daily as needed.    omeprazole  (PRILOSEC) 40 MG capsule Take 1 capsule (40 mg total) by mouth daily. Take 30 minutes prior to breakfast.    triamcinolone  cream (KENALOG ) 0.1 % APPLY TO AFFECTED AREA TWICE DAILY AS NEEDED    Vitamin D , Ergocalciferol , (DRISDOL ) 1.25 MG (50000 UNIT)  CAPS capsule Take 1 capsule (50,000 Units total) by mouth every 7 (seven) days.    No facility-administered encounter medications on file as of 10/11/2023.     Follow-Up   Return in about 4 weeks (around 11/08/2023) for For Weight Mangement with Dr. [name].SABRA She was informed of the importance of frequent follow up visits to maximize her success with intensive lifestyle modifications for her multiple health conditions.  Attestation Statement   Reviewed by clinician on day of visit: allergies, medications, problem list, medical history, surgical history, family history, social history, and previous encounter notes.     Lucas [name], MD

## 2023-10-11 NOTE — Assessment & Plan Note (Signed)
 Blood pressure improving now on amlodipine .  Continue current regimen most recent renal parameters show normal kidney function.  Losing 10% of body weight will improve blood pressure control

## 2023-10-11 NOTE — Assessment & Plan Note (Signed)
 Most recent A1c is  Lab Results  Component Value Date   HGBA1C 6.1 (H) 09/03/2023   HGBA1C 5.7 (H) 12/17/2017    Patient aware of disease state and risk of progression. This may contribute to abnormal cravings, fatigue and diabetic complications without having diabetes.   We have discussed treatment options which include: losing 7 to 10% of body weight, increasing physical activity to a goal of 150 minutes a week at moderate intensity.  Advised to maintain a diet low on simple and processed carbohydrates.  She may also be a candidate for pharmacoprophylaxis with metformin or incretin mimetic.   A1c is 6.1%, indicating prediabetes. Insulin  levels are elevated at 20.9, which is three times the normal level. Explained the relationship between insulin , weight gain, and metabolism, and emphasized the importance of reducing carbohydrate intake to manage insulin  levels and prevent progression to diabetes.  - Declined metformin in the past - Focus on reducing carbohydrate intake - Increase physical activity to improve insulin  sensitivity

## 2023-10-11 NOTE — Assessment & Plan Note (Signed)
 On CPAP with reported good compliance. Continue PAP therapy. Losing 15% or more of body weight may improve AHI.  We discussed how sleep apnea may affect weight loss due to disruption of energy regulation system.

## 2023-10-11 NOTE — Assessment & Plan Note (Signed)
 Obesity with cardiometabolic complications (hypertension with hypertensive heart disease, obstructive sleep apnea, MASLD, hypercholesterolemia, prediabetes) Obesity with associated cardiometabolic complications including hypertension with hypertensive heart disease, obstructive sleep apnea, MASLD, hypercholesterolemia, and prediabetes. Weight management is crucial to improve these conditions. Discussed the complexity of weight management, emphasizing the role of genetics, environment, stress, sleep, and diet. Highlighted the importance of consistency in lifestyle changes rather than perfection. Emphasized the need to reduce processed foods, sugars, and saturated fats to improve prediabetes and fatty liver. Discussed the potential role of GLP-1 receptor agonists like Wegovy  for weight loss and improvement of cardiometabolic conditions, but noted the need for long-term use and potential insurance coverage issues. Emphasized the importance of lifestyle changes in conjunction with any pharmacotherapy. Discussed the benefits of a 10-15% weight loss in improving cardiometabolic conditions. - Encourage consistent lifestyle changes focusing on reducing processed foods, sugars, and saturated fats. - Consider GLP-1 receptor agonists for weight management if lifestyle changes are insufficient, with a detailed discussion on risks, benefits, and insurance coverage. - Encourage meal planning and preparation, possibly with a partner, to facilitate healthier eating habits. - Suggest using meal replacements for convenience, especially for difficult meals like dinner. - Encourage regular physical activity, even in small increments, to increase daily steps. - Schedule follow-up in one month to assess progress and adjust the plan as needed.

## 2023-11-13 ENCOUNTER — Ambulatory Visit (INDEPENDENT_AMBULATORY_CARE_PROVIDER_SITE_OTHER): Admitting: Internal Medicine

## 2023-11-22 ENCOUNTER — Ambulatory Visit (INDEPENDENT_AMBULATORY_CARE_PROVIDER_SITE_OTHER): Admitting: Internal Medicine

## 2023-11-28 ENCOUNTER — Encounter: Payer: BC Managed Care – PPO | Admitting: Internal Medicine

## 2023-12-17 ENCOUNTER — Telehealth: Payer: Self-pay | Admitting: Pharmacist

## 2023-12-17 DIAGNOSIS — E7800 Pure hypercholesterolemia, unspecified: Secondary | ICD-10-CM

## 2023-12-17 NOTE — Progress Notes (Signed)
 12/17/2023 Name: [name] MRN: [id number] DOB: 10-Mar-1970  Chief Complaint  Patient presents with   Medication Management    Hyperlipidemia    [name] is a 53 y.o. year old female who presented for a telephone visit.   They were referred to the pharmacist by their PCP for assistance in managing hyperlipidemia/cardiovascular risk reduction.    Subjective: Patient is a 53 year old female with multiple medical conditions including but not limited to: aortic atherosclerosis, asthma, obesity, GERD, hyperlipidemia, prediabetes, and vitamin D  deficiency.  She has been seen by Dr. [name] at the [facility].  Patient said she has been having a hard time dealing with the grief of several family members and had been feeling depressed about her weight. She reported recognizing that she has not been eating as she should but said she had really been stressed out.  Care Team: Primary Care Provider: [name], MD ; Medication Access/Adherence  Current Pharmacy:  [id number] [address] ([place]), [place] - [address] [place] ([place]) [place] [address] Phone: [phone] Fax: [phone]  [id number] - [place], [place] - [address] AT [facility] OF [address] [place] [address] Phone: [phone] Fax: [phone]   Patient reports affordability concerns with their medications: No  Patient reports access/transportation concerns to their pharmacy: No  Patient reports adherence concerns with their medications:  Yes  Amlodipine  causes swelling  Hyperlipidemia/ASCVD Risk Reduction  Antiplatelet regimen:  Atorvastatin  80 mg 1 tablet three times per week. (Patient reported taking them every now and again) She had experienced some statin myopathy when taking daily. Dr Jarold recommended that she start Repatha  but she was unwilling to start an injection.  ASCVD  History:  aortic atherosclerosis  PREVENT Risk Score:  Clinical ASCVD: Yes  The 10-year ASCVD risk score (Arnett DK, et al., 2019) is: 6.5%   Values used to calculate the score:     Age: 50 years     Clincally relevant sex: Female     Is Non-Hispanic African American: Yes     Diabetic: No     Tobacco smoker: No     Systolic Blood Pressure: 133 mmHg     Is BP treated: Yes     HDL Cholesterol: 46 mg/dL     Total Cholesterol: 205 mg/dL    Objective:  Lab Results  Component Value Date   HGBA1C 6.1 (H) 09/03/2023    Lab Results  Component Value Date   CREATININE 0.68 09/03/2023   BUN 12 09/03/2023   NA 141 09/03/2023   K 4.2 09/03/2023   CL 102 09/03/2023   CO2 23 09/03/2023    Lab Results  Component Value Date   CHOL 205 (H) 09/03/2023   HDL 46 09/03/2023   LDLCALC 140 (H) 09/03/2023   TRIG 105 09/03/2023   CHOLHDL 4.9 (H) 05/22/2023    Medications Reviewed Today     Reviewed by [name], RPH (Pharmacist) on 12/17/23 at 1326  Med List Status: <None>   Medication Order Taking? Sig Documenting Provider Last Dose Status Informant  albuterol  (VENTOLIN  HFA) 108 (90 Base) MCG/ACT inhaler [id number] Yes Inhale 1-2 puffs into the lungs every 6 (six) hours as needed. Mayer, Jodi R, NP  Active   amLODipine  (NORVASC ) 2.5 MG tablet [id number]  Take 1 tablet (2.5 mg total) by mouth daily.  Patient not taking: Reported on 12/17/2023   Jarold,  Catheryn, MD  Active            Med Note MACK CASSIUS JINNY Pablo Dec 17, 2023  1:22 PM) Makes ankles swell  atorvastatin  (LIPITOR) 80 MG tablet [id number] Yes One tab po three days weekly Jarold Catheryn, MD  Active    Patient not taking:   Discontinued 12/17/23 1323 (Completed Course)   butalbital -acetaminophen -caffeine  (FIORICET) 50-325-40 MG tablet [id number] Yes One tab po q8h prn headache Jarold Catheryn, MD  Active   cetirizine  (ZYRTEC  ALLERGY ) 10 MG tablet [id number] Yes Take 1 tablet (10 mg total) by mouth daily. [name], [name]   Active   cyclobenzaprine  (FLEXERIL ) 5 MG tablet [id number] Yes Take 1 tablet (5 mg total) by mouth 3 (three) times daily as needed for muscle spasms. Jarold Catheryn, MD  Active            Med Note MACK CASSIUS JINNY Pablo Jun 25, 2023 11:44 AM) AS NEEDED  fluticasone  (FLONASE ) 50 MCG/ACT nasal spray [id number] Yes Place 1 spray into both nostrils daily. Petrina Pries, NP  Active   ketoconazole  (NIZORAL ) 2 % cream [id number] Yes Apply 1 Application topically daily. [name], [name]  Active   linaclotide LARUE) 290 MCG CAPS capsule [id number] Yes 1 cap(s) orally 30 minutes before the first meal of the day for 90 days [provider]  Active   nystatin  powder [id number] Yes Apply 1 Application topically 2 (two) times daily as needed. Jarold Catheryn, MD  Active   omeprazole  [facility]) 40 MG capsule [id number] Yes Take 1 capsule (40 mg total) by mouth daily. Take 30 minutes prior to breakfast. Marinda Rocky SAILOR, MD  Active   triamcinolone  cream (KENALOG ) 0.1 % [id number] Yes APPLY TO AFFECTED AREA TWICE DAILY AS NEEDED Jarold Catheryn, MD  Active   Vitamin D , Ergocalciferol , (DRISDOL ) 1.25 MG (50000 UNIT) CAPS capsule [id number] Yes Take 1 capsule (50,000 Units total) by mouth every 7 (seven) days. [name] Romano, MD  Active               Assessment/Plan:   Hyperlipidemia/ASCVD Risk Reduction: - Currently uncontrolled.  - Reviewed long term complications of uncontrolled cholesterol - Reviewed dietary recommendations including meal prepping - Reviewed lifestyle recommendations including exercising and meal prepping  - Recommend to schedule an appointment for follow up.     Follow Up Plan:    Reached out to the front desk staff about getting the Patient an appointment scheduled. Will send note to Provider about Patient not taking amlodipine  due to leg swelling.   Cassius DOROTHA Brought, PharmD, BCACP Clinical Pharmacist [phone]

## 2023-12-20 ENCOUNTER — Telehealth: Payer: Self-pay | Admitting: Pharmacist

## 2023-12-20 DIAGNOSIS — E7800 Pure hypercholesterolemia, unspecified: Secondary | ICD-10-CM

## 2023-12-20 NOTE — Progress Notes (Addendum)
   12/20/2023  Patient ID: [name], female   DOB: 09/20/1970, 53 y.o.   MRN: [id number]  Called Patient today to follow up on her meal prep as accountability.  HIPAA identifiers were obtained.  Patient reported doing meal prep for this week. She has mostly been eating salads for lunch and a protein shake for breakfast. She is trying to make good choices for the evening meals.  She has an appointment with [name] , NP tomorrow. A note will be sent to the Provider to remind them about her not taking amlodipine  due to swelling.   [name], PharmD, [facility] Clinical Pharmacist [facility] [phone]

## 2023-12-21 ENCOUNTER — Encounter: Payer: Self-pay | Admitting: Family Medicine

## 2023-12-21 ENCOUNTER — Ambulatory Visit: Admitting: Family Medicine

## 2023-12-21 ENCOUNTER — Telehealth: Payer: Self-pay | Admitting: Pharmacist

## 2023-12-21 ENCOUNTER — Other Ambulatory Visit: Payer: Self-pay

## 2023-12-21 VITALS — BP 130/80 | HR 83 | Temp 98.3°F | Ht 68.0 in | Wt 285.0 lb

## 2023-12-21 DIAGNOSIS — I119 Hypertensive heart disease without heart failure: Secondary | ICD-10-CM

## 2023-12-21 DIAGNOSIS — Z6841 Body Mass Index (BMI) 40.0 and over, adult: Secondary | ICD-10-CM

## 2023-12-21 DIAGNOSIS — E559 Vitamin D deficiency, unspecified: Secondary | ICD-10-CM

## 2023-12-21 DIAGNOSIS — Z2821 Immunization not carried out because of patient refusal: Secondary | ICD-10-CM

## 2023-12-21 DIAGNOSIS — R519 Headache, unspecified: Secondary | ICD-10-CM | POA: Diagnosis not present

## 2023-12-21 DIAGNOSIS — E66813 Obesity, class 3: Secondary | ICD-10-CM

## 2023-12-21 DIAGNOSIS — R7303 Prediabetes: Secondary | ICD-10-CM | POA: Diagnosis not present

## 2023-12-21 DIAGNOSIS — L304 Erythema intertrigo: Secondary | ICD-10-CM

## 2023-12-21 DIAGNOSIS — I700 Atherosclerosis of aorta: Secondary | ICD-10-CM

## 2023-12-21 MED ORDER — VITAMIN D (ERGOCALCIFEROL) 1.25 MG (50000 UNIT) PO CAPS: 50000.0000 [IU] | ORAL_CAPSULE | ORAL | 1 refills | Status: DC

## 2023-12-21 MED ORDER — CHLORTHALIDONE 25 MG PO TABS: 12.5000 mg | ORAL_TABLET | Freq: Every day | ORAL | 2 refills | Status: DC

## 2023-12-21 MED ORDER — BUTALBITAL-APAP-CAFFEINE 50-325-40 MG PO TABS: ORAL_TABLET | ORAL | 0 refills | Status: AC

## 2023-12-21 MED ORDER — LISINOPRIL 2.5 MG PO TABS: 2.5000 mg | ORAL_TABLET | Freq: Every day | ORAL | 5 refills | Status: DC

## 2023-12-21 MED ORDER — NYSTATIN 100000 UNIT/GM EX POWD: 1.0000 | Freq: Two times a day (BID) | CUTANEOUS | 1 refills | Status: DC | PRN

## 2023-12-21 NOTE — Progress Notes (Signed)
 12/21/2023  Patient ID: [name], female   DOB: Aug 17, 1970, 53 y.o.   MRN: [id number]  Patient called to get some information on lisinopril.  She had an appointment today to see [name], NP and amlodipine  was changed to lisinopril .  HIPAA identifiers were obtained.  Patient was educated about lisinopril including side effects and mechanism of action. She confirmed restarting atorvastatin  three times per week as well.   Patient said she will pick up a new blood pressure monitor this weekend.  Reviewed Patient's medications:  Medications Reviewed Today     Reviewed by [name], [facility] (Pharmacist) on 12/21/23 at 1612  Med List Status: <None>   Medication Order Taking? Sig Documenting Provider Last Dose Status Informant  albuterol  (VENTOLIN  HFA) 108 (90 Base) MCG/ACT inhaler [id number] Yes Inhale 1-2 puffs into the lungs every 6 (six) hours as needed. [name], NP  Active   amLODipine  (NORVASC ) 2.5 MG tablet [id number]  Take 1 tablet (2.5 mg total) by mouth daily.  Patient not taking: Reported on 12/21/2023   [name], MD  Active            Med Note MACK, CASSIUS PARAS Kitchens Dec 17, 2023  1:22 PM) Makes ankles swell  atorvastatin  (LIPITOR) 80 MG tablet [id number] Yes One tab po three days weekly  Patient taking differently: Take 1 tablet by mouth every other day. One tab po three days weekly   [name], MD  Active   butalbital -acetaminophen -caffeine  (FIORICET) 50-325-40 MG tablet [id number] Yes One tab po q8h prn headache Creighton Bruna, NP  Active   cetirizine  (ZYRTEC  ALLERGY ) 10 MG tablet [id number] Yes Take 1 tablet (10 mg total) by mouth daily. [name], [name]  Active   cyclobenzaprine  (FLEXERIL ) 5 MG tablet [id number] Yes Take 1 tablet (5 mg total) by mouth 3 (three) times daily as needed for muscle spasms. [name], MD  Active            Med Note MACK CASSIUS PARAS Kitchens Jun 25, 2023 11:44 AM) AS NEEDED  fluticasone  (FLONASE ) 50 MCG/ACT nasal spray  [id number] Yes Place 1 spray into both nostrils daily. Creighton Bruna, NP  Active   ketoconazole  (NIZORAL ) 2 % cream [id number] Yes Apply 1 Application topically daily. [name], [name]  Active   linaclotide LARUE) 290 MCG CAPS capsule [id number] Yes 1 cap(s) orally 30 minutes before the first meal of the day for 90 days  Patient taking differently: daily as needed.   [provider]  Active   lisinopril (ZESTRIL) 2.5 MG tablet [id number] Yes Take 1 tablet (2.5 mg total) by mouth daily. Creighton Bruna, NP  Active   nystatin  powder [id number] Yes Apply 1 Application topically 2 (two) times daily as needed. Creighton Bruna, NP  Active   omeprazole  (PRILOSEC) 40 MG capsule [id number] Yes Take 1 capsule (40 mg total) by mouth daily. Take 30 minutes prior to breakfast. Marinda Rocky SAILOR, MD  Active   triamcinolone  cream (KENALOG ) 0.1 % [id number] Yes APPLY TO AFFECTED AREA TWICE DAILY AS NEEDED [name], MD  Active   Vitamin D , Ergocalciferol , (DRISDOL ) 1.25 MG (50000 UNIT) CAPS capsule [id number] Yes Take 1 capsule (50,000 Units total) by mouth every 7 (seven) days. Creighton Bruna, NP  Active              Plan: Follow up with the Patient in 2 weeks to follow up on blood pressure. Patient also said she forgot to mention  needing a refill on her vitamin D  capsules. A refill was sent --cosignature required.   Cassius DOROTHA Brought, PharmD, BCACP Clinical Pharmacist [phone]

## 2023-12-21 NOTE — Patient Instructions (Signed)
 Hypertension, Adult Hypertension is another name for high blood pressure. High blood pressure forces your heart to work harder to pump blood. This can cause problems over time. There are two numbers in a blood pressure reading. There is a top number (systolic) over a bottom number (diastolic). It is best to have a blood pressure that is below 120/80. What are the causes? The cause of this condition is not known. Some other conditions can lead to high blood pressure. What increases the risk? Some lifestyle factors can make you more likely to develop high blood pressure: Smoking. Not getting enough exercise or physical activity. Being overweight. Having too much fat, sugar, calories, or salt (sodium) in your diet. Drinking too much alcohol. Other risk factors include: Having any of these conditions: Heart disease. Diabetes. High cholesterol. Kidney disease. Obstructive sleep apnea. Having a family history of high blood pressure and high cholesterol. Age. The risk increases with age. Stress. What are the signs or symptoms? High blood pressure may not cause symptoms. Very high blood pressure (hypertensive crisis) may cause: Headache. Fast or uneven heartbeats (palpitations). Shortness of breath. Nosebleed. Vomiting or feeling like you may vomit (nauseous). Changes in how you see. Very bad chest pain. Feeling dizzy. Seizures. How is this treated? This condition is treated by making healthy lifestyle changes, such as: Eating healthy foods. Exercising more. Drinking less alcohol. Your doctor may prescribe medicine if lifestyle changes do not help enough and if: Your top number is above 130. Your bottom number is above 80. Your personal target blood pressure may vary. Follow these instructions at home: Eating and drinking  If told, follow the DASH eating plan. To follow this plan: Fill one half of your plate at each meal with fruits and vegetables. Fill one fourth of your plate  at each meal with whole grains. Whole grains include whole-wheat pasta, brown rice, and whole-grain bread. Eat or drink low-fat dairy products, such as skim milk or low-fat yogurt. Fill one fourth of your plate at each meal with low-fat (lean) proteins. Low-fat proteins include fish, chicken without skin, eggs, beans, and tofu. Avoid fatty meat, cured and processed meat, or chicken with skin. Avoid pre-made or processed food. Limit the amount of salt in your diet to less than 1,500 mg each day. Do not drink alcohol if: Your doctor tells you not to drink. You are pregnant, may be pregnant, or are planning to become pregnant. If you drink alcohol: Limit how much you have to: 0-1 drink a day for women. 0-2 drinks a day for men. Know how much alcohol is in your drink. In the [place] one drink equals one 12 oz bottle of beer (355 mL), one 5 oz glass of wine (148 mL), or one 1 oz glass of hard liquor (44 mL). Lifestyle  Work with your doctor to stay at a healthy weight or to lose weight. Ask your doctor what the best weight is for you. Get at least 30 minutes of exercise that causes your heart to beat faster (aerobic exercise) most days of the week. This may include walking, swimming, or biking. Get at least 30 minutes of exercise that strengthens your muscles (resistance exercise) at least 3 days a week. This may include lifting weights or doing Pilates. Do not smoke or use any products that contain nicotine or tobacco. If you need help quitting, ask your doctor. Check your blood pressure at home as told by your doctor. Keep all follow-up visits. Medicines Take over-the-counter and prescription medicines  only as told by your doctor. Follow directions carefully. Do not skip doses of blood pressure medicine. The medicine does not work as well if you skip doses. Skipping doses also puts you at risk for problems. Ask your doctor about side effects or reactions to medicines that you should watch  for. Contact a doctor if: You think you are having a reaction to the medicine you are taking. You have headaches that keep coming back. You feel dizzy. You have swelling in your ankles. You have trouble with your vision. Get help right away if: You get a very bad headache. You start to feel mixed up (confused). You feel weak or numb. You feel faint. You have very bad pain in your: Chest. Belly (abdomen). You vomit more than once. You have trouble breathing. These symptoms may be an emergency. Get help right away. Call 911. Do not wait to see if the symptoms will go away. Do not drive yourself to the hospital. Summary Hypertension is another name for high blood pressure. High blood pressure forces your heart to work harder to pump blood. For most people, a normal blood pressure is less than 120/80. Making healthy choices can help lower blood pressure. If your blood pressure does not get lower with healthy choices, you may need to take medicine. This information is not intended to replace advice given to you by your health care provider. Make sure you discuss any questions you have with your health care provider. Document Revised: 11/25/2020 Document Reviewed: 11/25/2020 Elsevier Patient Education  2024 [employer].
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
## 2023-12-27 ENCOUNTER — Telehealth: Payer: Self-pay | Admitting: Pharmacist

## 2023-12-27 NOTE — Progress Notes (Unsigned)
   12/27/2023  Patient ID: [name], female   DOB: 25-Aug-1970, 53 y.o.   MRN: [id number]  Called Patient to follow up on meal planning. HIPAA identifiers were obtained.  She reported feeling some achiness and wondered if it was lisinopril.  She said around both ankles she is feeling pain. Said they feel tight and look dark.  She was also taking Atorvastatin  which has been known to cause her some muscle pain.  She said her ankles felt tight but did not report them feeling hot to the touch.  Patient wondered if she should hold Atorvastatin .   Plan: send a message to Dr. Jarold and call patient back with a plan.   [name], PharmD, BCACP Clinical Pharmacist [phone]   ADDENDUM  Patient was sent to urgent care.   [name], PharmD, BCACP Clinical Pharmacist [phone]

## 2024-01-01 DIAGNOSIS — Z2821 Immunization not carried out because of patient refusal: Secondary | ICD-10-CM | POA: Insufficient documentation

## 2024-01-01 NOTE — Assessment & Plan Note (Signed)
 Recent elevated readings. Amlodipine  discontinued due to leg swelling. Current blood pressure 130/80 mmHg. - Discontinued amlodipine .

## 2024-01-01 NOTE — Assessment & Plan Note (Signed)
 Low carb diet advised

## 2024-01-01 NOTE — Assessment & Plan Note (Signed)
 Headaches possibly related to sinus issues. No formal migraine diagnosis. Fioricet previously effective. - Refilled Fioricet for headache management.

## 2024-01-01 NOTE — Assessment & Plan Note (Signed)
 She is encouraged to strive for BMI less than 30 to decrease cardiac risk. Advised to aim for at least 150 minutes of exercise per week.

## 2024-01-30 ENCOUNTER — Encounter (HOSPITAL_BASED_OUTPATIENT_CLINIC_OR_DEPARTMENT_OTHER): Payer: Self-pay | Admitting: Cardiovascular Disease

## 2024-01-31 ENCOUNTER — Ambulatory Visit: Admitting: Internal Medicine

## 2024-01-31 ENCOUNTER — Encounter: Payer: Self-pay | Admitting: Internal Medicine

## 2024-01-31 VITALS — BP 122/70 | HR 78 | Temp 98.5°F | Ht 68.0 in | Wt 284.4 lb

## 2024-01-31 DIAGNOSIS — E559 Vitamin D deficiency, unspecified: Secondary | ICD-10-CM

## 2024-01-31 DIAGNOSIS — E7800 Pure hypercholesterolemia, unspecified: Secondary | ICD-10-CM

## 2024-01-31 DIAGNOSIS — I119 Hypertensive heart disease without heart failure: Secondary | ICD-10-CM | POA: Diagnosis not present

## 2024-01-31 DIAGNOSIS — R7303 Prediabetes: Secondary | ICD-10-CM | POA: Diagnosis not present

## 2024-01-31 DIAGNOSIS — I700 Atherosclerosis of aorta: Secondary | ICD-10-CM

## 2024-01-31 MED ORDER — VITAMIN D (ERGOCALCIFEROL) 1.25 MG (50000 UNIT) PO CAPS: 50000.0000 [IU] | ORAL_CAPSULE | ORAL | 1 refills | Status: AC

## 2024-01-31 NOTE — Patient Instructions (Addendum)
 Coenzyme Q10 (CoQ10) 100mg  daily  Hypertension, Adult Hypertension is another name for high blood pressure. High blood pressure forces your heart to work harder to pump blood. This can cause problems over time. There are two numbers in a blood pressure reading. There is a top number (systolic) over a bottom number (diastolic). It is best to have a blood pressure that is below 120/80. What are the causes? The cause of this condition is not known. Some other conditions can lead to high blood pressure. What increases the risk? Some lifestyle factors can make you more likely to develop high blood pressure: Smoking. Not getting enough exercise or physical activity. Being overweight. Having too much fat, sugar, calories, or salt (sodium) in your diet. Drinking too much alcohol. Other risk factors include: Having any of these conditions: Heart disease. Diabetes. High cholesterol. Kidney disease. Obstructive sleep apnea. Having a family history of high blood pressure and high cholesterol. Age. The risk increases with age. Stress. What are the signs or symptoms? High blood pressure may not cause symptoms. Very high blood pressure (hypertensive crisis) may cause: Headache. Fast or uneven heartbeats (palpitations). Shortness of breath. Nosebleed. Vomiting or feeling like you may vomit (nauseous). Changes in how you see. Very bad chest pain. Feeling dizzy. Seizures. How is this treated? This condition is treated by making healthy lifestyle changes, such as: Eating healthy foods. Exercising more. Drinking less alcohol. Your doctor may prescribe medicine if lifestyle changes do not help enough and if: Your top number is above 130. Your bottom number is above 80. Your personal target blood pressure may vary. Follow these instructions at home: Eating and drinking  If told, follow the DASH eating plan. To follow this plan: Fill one half of your plate at each meal with fruits and  vegetables. Fill one fourth of your plate at each meal with whole grains. Whole grains include whole-wheat pasta, brown rice, and whole-grain bread. Eat or drink low-fat dairy products, such as skim milk or low-fat yogurt. Fill one fourth of your plate at each meal with low-fat (lean) proteins. Low-fat proteins include fish, chicken without skin, eggs, beans, and tofu. Avoid fatty meat, cured and processed meat, or chicken with skin. Avoid pre-made or processed food. Limit the amount of salt in your diet to less than 1,500 mg each day. Do not drink alcohol if: Your doctor tells you not to drink. You are pregnant, may be pregnant, or are planning to become pregnant. If you drink alcohol: Limit how much you have to: 0-1 drink a day for women. 0-2 drinks a day for men. Know how much alcohol is in your drink. In the [place] one drink equals one 12 oz bottle of beer (355 mL), one 5 oz glass of wine (148 mL), or one 1 oz glass of hard liquor (44 mL). Lifestyle  Work with your doctor to stay at a healthy weight or to lose weight. Ask your doctor what the best weight is for you. Get at least 30 minutes of exercise that causes your heart to beat faster (aerobic exercise) most days of the week. This may include walking, swimming, or biking. Get at least 30 minutes of exercise that strengthens your muscles (resistance exercise) at least 3 days a week. This may include lifting weights or doing Pilates. Do not smoke or use any products that contain nicotine or tobacco. If you need help quitting, ask your doctor. Check your blood pressure at home as told by your doctor. Keep all follow-up visits.  Medicines Take over-the-counter and prescription medicines only as told by your doctor. Follow directions carefully. Do not skip doses of blood pressure medicine. The medicine does not work as well if you skip doses. Skipping doses also puts you at risk for problems. Ask your doctor about side effects or  reactions to medicines that you should watch for. Contact a doctor if: You think you are having a reaction to the medicine you are taking. You have headaches that keep coming back. You feel dizzy. You have swelling in your ankles. You have trouble with your vision. Get help right away if: You get a very bad headache. You start to feel mixed up (confused). You feel weak or numb. You feel faint. You have very bad pain in your: Chest. Belly (abdomen). You vomit more than once. You have trouble breathing. These symptoms may be an emergency. Get help right away. Call 911. Do not wait to see if the symptoms will go away. Do not drive yourself to the hospital. Summary Hypertension is another name for high blood pressure. High blood pressure forces your heart to work harder to pump blood. For most people, a normal blood pressure is less than 120/80. Making healthy choices can help lower blood pressure. If your blood pressure does not get lower with healthy choices, you may need to take medicine. This information is not intended to replace advice given to you by your health care provider. Make sure you discuss any questions you have with your health care provider. Document Revised: 11/25/2020 Document Reviewed: 11/25/2020 Elsevier Patient Education  2024 [employer].

## 2024-01-31 NOTE — Progress Notes (Unsigned)
 I,Victoria T Emmitt, CMA,acting as a neurosurgeon for Tanya LOISE Slocumb, MD.,have documented all relevant documentation on the behalf of Tanya LOISE Slocumb, MD,as directed by  Tanya LOISE Slocumb, MD while in the presence of Tanya LOISE Slocumb, MD.  Subjective:  Patient ID: [name] , female    DOB: February 24, 1970 , 53 y.o.   MRN: [id number]  Chief Complaint  Patient presents with   Hypertension    Patient presents today for bp, chol & predm follow up. She reports compliance with medications. Denies headache, chest pain & sob. She is concerned about weight gain. She has noticed gaining 15 pounds since July. Which discourages her.  She also recently was taken off Lisinopril . The medication caused leg hardening with what she describes  dried up blood turning colors. The last day she took Lisinopril :  12/24/23.   Prediabetes   Hyperlipidemia    HPI  Hypertension This is a new problem. The current episode started more than 1 month ago. The problem has been gradually improving since onset. Pertinent negatives include no blurred vision, chest pain, palpitations or shortness of breath. Risk factors for coronary artery disease include dyslipidemia and obesity. Past treatments include calcium  channel blockers. The current treatment provides moderate improvement. There is no history of coarctation of the aorta or hyperaldosteronism.     Past Medical History:  Diagnosis Date   Abdominal pain    Allergy     Allergy -induced asthma    Angina 05/2011   NO HEART PROBLEM WENT TO ED 05/22/11 FOR CP STRESS TEST WAS NEGATIVE    Ankle fracture    Aortic atherosclerosis 09/01/2021   Back pain    Chronic back pain greater than 3 months duration 01/17/2011   injured lumbar and posterior cervical neck in fall    Constipation    DDD (degenerative disc disease), cervical    Depression    Diverticulitis    Edema of both lower extremities    Gallbladder problem    GERD (gastroesophageal reflux disease)     H/O hiatal hernia    Hyperlipidemia    IBS (irritable bowel syndrome)    Joint pain    Nausea    Neuromuscular disorder (HCC)    LUMBA BACK  TROUBLE    Pre-diabetes    Rash    UNDER BIL BREASTS   Sleep apnea    SOB (shortness of breath)    Vitamin B 12 deficiency    Vitamin D  deficiency    Wrist fracture      Family History  Problem Relation Age of Onset   Diabetes Mother    Hyperlipidemia Mother    Cancer Mother    Sleep apnea Mother    Obesity Mother    Heart attack Father    Cancer Father        kidney and prostate   Hyperlipidemia Father    Other Father        gout and clogged arteries   Hypertension Father    Kidney disease Father    Alcoholism Father    Renal Disease Father    Hyperlipidemia Sister    Diabetes Sister    Diabetes Brother    Hyperlipidemia Brother    Coronary artery disease Brother    Heart disease Maternal Grandmother     Current Medications[1]   Allergies[2]   Review of Systems  Constitutional: Negative.   Eyes:  Negative for blurred vision.  Respiratory: Negative.  Negative for shortness of breath.   Cardiovascular: Negative.  Negative for chest pain and palpitations.  Gastrointestinal: Negative.   Neurological: Negative.   Psychiatric/Behavioral: Negative.       Today's Vitals   01/31/24 1011  BP: 122/70  Pulse: 78  Temp: 98.5 F (36.9 C)  SpO2: 98%  Weight: 284 lb 6.4 oz (129 kg)  Height: 5' 8 (1.727 m)   Body mass index is 43.24 kg/m.  Wt Readings from Last 3 Encounters:  01/31/24 284 lb 6.4 oz (129 kg)  12/21/23 285 lb (129.3 kg)  10/11/23 275 lb (124.7 kg)    The 10-year ASCVD risk score (Arnett DK, et al., 2019) is: 3.3%   Values used to calculate the score:     Age: 75 years     Clinically relevant sex: Female     Is Non-Hispanic African American: Yes     Diabetic: No     Tobacco smoker: No     Systolic Blood Pressure: 122 mmHg     Is BP treated: No     HDL Cholesterol:  46 mg/dL     Total Cholesterol: 205 mg/dL  Objective:  Physical Exam Vitals and nursing note reviewed.  Constitutional:      Appearance: Normal appearance.  HENT:     Head: Normocephalic and atraumatic.  Eyes:     Extraocular Movements: Extraocular movements intact.  Cardiovascular:     Rate and Rhythm: Normal rate and regular rhythm.     Heart sounds: Normal heart sounds.  Pulmonary:     Effort: Pulmonary effort is normal.     Breath sounds: Normal breath sounds.  Musculoskeletal:     Cervical back: Normal range of motion.  Skin:    General: Skin is warm.  Neurological:     General: No focal deficit present.     Mental Status: She is alert.  Psychiatric:        Mood and Affect: Mood normal.        Behavior: Behavior normal.         Assessment And Plan:   Assessment & Plan Hypertensive heart disease without heart failure  Prediabetes  Pure hypercholesterolemia  Aortic atherosclerosis   No orders of the defined types were placed in this encounter.    Return if symptoms worsen or fail to improve.  Patient was given opportunity to ask questions. Patient verbalized understanding of the plan and was able to repeat key elements of the plan. All questions were answered to their satisfaction.    I, Tanya LOISE Slocumb, MD, have reviewed all documentation for this visit. The documentation on 01/31/2024 for the exam, diagnosis, procedures, and orders are all accurate and complete.   IF YOU HAVE BEEN REFERRED TO A SPECIALIST, IT MAY TAKE 1-2 WEEKS TO SCHEDULE/PROCESS THE REFERRAL. IF YOU HAVE NOT HEARD FROM US /SPECIALIST IN TWO WEEKS, PLEASE GIVE US  A CALL AT [phone].        [1]  Current Outpatient Medications:    albuterol  (VENTOLIN  HFA) 108 (90 Base) MCG/ACT inhaler, Inhale 1-2 puffs into the lungs every 6 (six) hours as needed., Disp: 1 each, Rfl: 0   atorvastatin  (LIPITOR) 80 MG tablet, One tab po three days weekly (Patient taking differently: Take 1  tablet by mouth every other day. One tab po three days weekly), Disp: 36 tablet, Rfl: 3   butalbital -acetaminophen -caffeine  (FIORICET) 50-325-40 MG tablet, One tab po q8h prn headache, Disp: 20 tablet, Rfl: 0   cetirizine  (ZYRTEC  ALLERGY ) 10 MG tablet, Take 1 tablet (10 mg total) by mouth  daily., Disp: 30 tablet, Rfl: 0   cyclobenzaprine  (FLEXERIL ) 5 MG tablet, Take 1 tablet (5 mg total) by mouth 3 (three) times daily as needed for muscle spasms., Disp: 30 tablet, Rfl: 0   fluticasone  (FLONASE ) 50 MCG/ACT nasal spray, Place 1 spray into both nostrils daily., Disp: 15.8 mL, Rfl: 1   ketoconazole  (NIZORAL ) 2 % cream, Apply 1 Application topically daily., Disp: 60 g, Rfl: 0   linaclotide (LINZESS) 290 MCG CAPS capsule, 1 cap(s) orally 30 minutes before the first meal of the day for 90 days (Patient taking differently: daily as needed.), Disp: , Rfl:    nystatin  powder, Apply 1 Application topically 2 (two) times daily as needed., Disp: 45 g, Rfl: 1   omeprazole  (PRILOSEC) 40 MG capsule, Take 1 capsule (40 mg total) by mouth daily. Take 30 minutes prior to breakfast., Disp: 30 capsule, Rfl: 3   triamcinolone  cream (KENALOG ) 0.1 %, APPLY TO AFFECTED AREA TWICE DAILY AS NEEDED, Disp: 30 g, Rfl: 0   Vitamin D , Ergocalciferol , (DRISDOL ) 1.25 MG (50000 UNIT) CAPS capsule, Take 1 capsule (50,000 Units total) by mouth every 7 (seven) days., Disp: 16 capsule, Rfl: 1 [2] Allergies Allergen Reactions   Amoxicillin Hives   Sulfa Antibiotics Hives   Penicillins Hives and Itching    Has patient had a PCN reaction causing immediate rash, facial/tongue/throat swelling, SOB or lightheadedness with hypotension: Yes Has patient had a PCN reaction causing severe rash involving mucus membranes or skin necrosis: No Has patient had a PCN reaction that required hospitalization No Has patient had a PCN reaction occurring within the last 10 years: Yes If all of the above answers are NO, then may proceed with  Cephalosporin use.    Tape     PLASTIC TAPE   (WHELPS, TORE SKIN)   Tramadol Hives

## 2024-02-01 LAB — CMP14+EGFR
ALT: 20 IU/L (ref 0–32)
AST: 26 IU/L (ref 0–40)
Albumin: 4.6 g/dL (ref 3.8–4.9)
Alkaline Phosphatase: 76 IU/L (ref 49–135)
BUN/Creatinine Ratio: 15 (ref 9–23)
BUN: 10 mg/dL (ref 6–24)
Bilirubin Total: 0.3 mg/dL (ref 0.0–1.2)
CO2: 25 mmol/L (ref 20–29)
Calcium: 9.7 mg/dL (ref 8.7–10.2)
Chloride: 101 mmol/L (ref 96–106)
Creatinine, Ser: 0.68 mg/dL (ref 0.57–1.00)
Globulin, Total: 2.7 g/dL (ref 1.5–4.5)
Glucose: 96 mg/dL (ref 70–99)
Potassium: 4.2 mmol/L (ref 3.5–5.2)
Sodium: 139 mmol/L (ref 134–144)
Total Protein: 7.3 g/dL (ref 6.0–8.5)
eGFR: 103 mL/min/1.73 (ref >59)

## 2024-02-01 LAB — HEMOGLOBIN A1C
Est. average glucose Bld gHb Est-mCnc: 131 mg/dL
Hgb A1c MFr Bld: 6.2 % — ABNORMAL HIGH (ref 4.8–5.6)

## 2024-02-02 ENCOUNTER — Ambulatory Visit: Payer: Self-pay | Admitting: Internal Medicine

## 2024-02-02 NOTE — Assessment & Plan Note (Signed)
 BMI 43. Weight loss efforts include walking and dietary changes. Zepbound discussed but not covered by insurance. Form online program suggested as a cost-effective alternative. - NovoCare and [employer] are not affordable. - Explore Form online program for weight management. - Continue walking and dietary modifications.

## 2024-02-02 NOTE — Assessment & Plan Note (Signed)
 Hemoglobin A1c at 6.1%. Goal: 10% body weight loss. - Encourage 10% body weight loss. - Continue with regular exercise program, incorporate more strength training into her weekly workouts.

## 2024-02-02 NOTE — Assessment & Plan Note (Signed)
 Seen on coronary CTA. LDL goal is less than 70. Agrees to try statin therapy three days weekly.

## 2024-02-02 NOTE — Assessment & Plan Note (Signed)
 Chronic, well controlled.  Blood pressure controlled at 122/70 mmHg. Lisinopril  discontinued due to leg bruising, managed with compression socks. - Continue current lifestyle modifications including walking and dietary changes. - Follow low sodium diet.

## 2024-02-02 NOTE — Assessment & Plan Note (Signed)
 Noted in July. No current supplementation. Vitamin D  deficiency may contribute to muscle aches. - Prescribed vitamin D  supplementation once weekly. - Transition to over-the-counter vitamin D  after prescription runs out.

## 2024-02-02 NOTE — Assessment & Plan Note (Signed)
 Atorvastatin  80 mg taken inconsistently due to muscle aches. Coenzyme Q10 suggested to alleviate muscle aches. Vitamin D  deficiency may contribute to muscle aches. - Start Coenzyme Q10 100mg  daily with atorvastatin . - Prescribed vitamin D  supplementation once weekly. - Monitor response to Coenzyme Q10 and adjust dosage as tolerated.

## 2024-03-20 ENCOUNTER — Telehealth: Payer: Self-pay | Admitting: Pharmacist

## 2024-03-20 DIAGNOSIS — E7800 Pure hypercholesterolemia, unspecified: Secondary | ICD-10-CM

## 2024-03-20 DIAGNOSIS — L304 Erythema intertrigo: Secondary | ICD-10-CM

## 2024-03-20 MED ORDER — OMEPRAZOLE 40 MG PO CPDR: 40.0000 mg | DELAYED_RELEASE_CAPSULE | Freq: Every day | ORAL | 1 refills | Status: AC

## 2024-03-20 MED ORDER — NYSTATIN 100000 UNIT/GM EX POWD: 1.0000 | Freq: Two times a day (BID) | CUTANEOUS | 1 refills | Status: AC | PRN

## 2024-03-20 MED ORDER — ATORVASTATIN CALCIUM 80 MG PO TABS: ORAL_TABLET | ORAL | 3 refills | Status: AC

## 2024-03-20 NOTE — Progress Notes (Signed)
" ° °  03/20/2024 Name: [name] MRN: [id number] DOB: 1970-12-04  Chief Complaint  Patient presents with   Medication Management    Hyperlipidemia   Patient was called to follow up on hyperlipidemia. HIPAA identifiers were obtained.  She shared that her family was going through some issues with her Father-In-Law.  She reported that she had been taking Atorvastatin  sporadically but that she was/is committed to taking her medications as prescribed in the new year.  Her word for the year is obedience.  Patient was seen in December.  Her HgA1c was: Lab Results  Component Value Date   HGBA1C 6.2 (H) 01/31/2024   HGBA1C 6.1 (H) 09/03/2023   HGBA1C 6.2 (H) 05/22/2023   Her lipid panel showed the following: Lipid Panel     Component Value Date/Time   CHOL 205 (H) 09/03/2023 0940   TRIG 105 09/03/2023 0940   HDL 46 09/03/2023 0940   CHOLHDL 4.9 (H) 05/22/2023 0926   LDLCALC 140 (H) 09/03/2023 0940   LABVLDL 19 09/03/2023 0940   She will need to have new labs drawn at her April appointment with Dr Jarold.  She requested refills on the following medications: Omeprazole , Nystatin  powder, and atorvastatin . All three were sent co signature required to Dr. Jarold.  Atorvastatin  80 mg 1 tablet three times weekly Nystatin  powder Omeprazole  40 mg 1 capsule daily   I will follow up with the Patient in 2 weeks.   [name], PharmD, BCACP Clinical Pharmacist [phone]   "

## 2024-03-21 ENCOUNTER — Ambulatory Visit
Admission: RE | Admit: 2024-03-21 | Discharge: 2024-03-21 | Disposition: A | Discharge status: Home / Self Care | Source: Ambulatory Visit | Attending: Family Medicine

## 2024-03-21 VITALS — BP 152/93 | HR 87 | Temp 98.8°F | Resp 18

## 2024-03-21 DIAGNOSIS — B349 Viral infection, unspecified: Secondary | ICD-10-CM

## 2024-03-21 DIAGNOSIS — B9789 Other viral agents as the cause of diseases classified elsewhere: Secondary | ICD-10-CM

## 2024-03-21 DIAGNOSIS — R0602 Shortness of breath: Secondary | ICD-10-CM

## 2024-03-21 DIAGNOSIS — J4521 Mild intermittent asthma with (acute) exacerbation: Secondary | ICD-10-CM | POA: Diagnosis not present

## 2024-03-21 DIAGNOSIS — J988 Other specified respiratory disorders: Secondary | ICD-10-CM

## 2024-03-21 LAB — POC COVID19/FLU A&B COMBO
Covid Antigen, POC: NEGATIVE
Influenza A Antigen, POC: NEGATIVE
Influenza B Antigen, POC: NEGATIVE

## 2024-03-21 MED ORDER — ALBUTEROL SULFATE HFA 108 (90 BASE) MCG/ACT IN AERS: 1.0000 | INHALATION_SPRAY | Freq: Four times a day (QID) | RESPIRATORY_TRACT | 0 refills | Status: AC | PRN

## 2024-03-21 MED ORDER — PREDNISONE 20 MG PO TABS: ORAL_TABLET | ORAL | 0 refills | Status: AC

## 2024-03-21 NOTE — ED Triage Notes (Signed)
 Pt reports headache, ear pain, cough and sore throat x 2 days. Nyquil gives some relief, pt concern as she has high blood pressure and wants another med.

## 2024-03-21 NOTE — ED Provider Notes (Signed)
 " [facility]  Note:  This document was prepared using Conservation officer, historic buildings and may include unintentional dictation errors.  MRN: [id number] DOB: Oct 09, 1970  Subjective:   [name] is a 54 y.o. female presenting for 2 day history of throat pain, bilateral ear pain, headaches. Now having mild chest pain, shob, wheezing. Has asthma, needs a refill of her inhaler. No smoking of any kind including cigarettes, cigars, vaping, marijuana use.    Current Outpatient Medications  Medication Instructions   albuterol  (VENTOLIN  HFA) 108 (90 Base) MCG/ACT inhaler 1-2 puffs, Inhalation, Every 6 hours PRN   atorvastatin  (LIPITOR) 80 MG tablet One tab po three days weekly   butalbital -acetaminophen -caffeine  (FIORICET) 50-325-40 MG tablet One tab po q8h prn headache   cetirizine  (ZYRTEC  ALLERGY ) 10 mg, Oral, Daily   cyclobenzaprine  (FLEXERIL ) 5 mg, Oral, 3 times daily PRN   famotidine  (PEPCID ) 40 mg, 2 times daily   fluticasone  (FLONASE ) 50 MCG/ACT nasal spray 1 spray, Each Nare, Daily   ketoconazole  (NIZORAL ) 2 % cream 1 Application, Topical, Daily   linaclotide (LINZESS) 290 MCG CAPS capsule 1 cap(s) orally 30 minutes before the first meal of the day for 90 days   nystatin  powder 1 Application, Topical, 2 times daily PRN   nystatin -triamcinolone  ointment (MYCOLOG) 2 times daily   omeprazole  (PRILOSEC) 40 mg, Oral, Daily, Take 30 minutes prior to breakfast.   Pseudoeph-Doxylamine-DM-APAP (NYQUIL PO) Take by mouth.   triamcinolone  cream (KENALOG ) 0.1 % APPLY TO AFFECTED AREA TWICE DAILY AS NEEDED   Vitamin D  (Ergocalciferol ) (DRISDOL ) 50,000 Units, Oral, Every 7 days    Allergies[1]  Past Medical History:  Diagnosis Date   Abdominal pain    Allergy     Allergy -induced asthma    Angina 05/2011   NO HEART PROBLEM WENT TO ED 05/22/11 FOR CP STRESS TEST WAS NEGATIVE    Ankle fracture    Aortic atherosclerosis 09/01/2021   Back pain    Chronic back pain  greater than 3 months duration 01/17/2011   injured lumbar and posterior cervical neck in fall    Constipation    DDD (degenerative disc disease), cervical    Depression    Diverticulitis    Edema of both lower extremities    Gallbladder problem    GERD (gastroesophageal reflux disease)    H/O hiatal hernia    Hyperlipidemia    IBS (irritable bowel syndrome)    Joint pain    Nausea    Neuromuscular disorder (HCC)    LUMBA BACK  TROUBLE    Pre-diabetes    Rash    UNDER BIL BREASTS   Sleep apnea    SOB (shortness of breath)    Vitamin B 12 deficiency    Vitamin D  deficiency    Wrist fracture      Past Surgical History:  Procedure Laterality Date   BREAST CYST EXCISION Left ~ 1983   CHOLECYSTECTOMY  08/2010   COLONOSCOPY  07/2010   Removal of 3 polyps   DILATATION & CURETTAGE/HYSTEROSCOPY WITH MYOSURE N/A 09/03/2015   Procedure: DILATATION & CURETTAGE/HYSTEROSCOPY WITH MYOSURE;  Surgeon: Dickie Carder, MD;  Location: [facility];  Service: Gynecology;  Laterality: N/A;   FRACTURE SURGERY  06/12/11   patient denies   HERNIA REPAIR  1997; 05/2011   umbilical corrected during tubal ligation; VHR w/mesh   TUBAL LIGATION  12/1995   VENTRAL HERNIA REPAIR  06/12/2011   Procedure: LAPAROSCOPIC VENTRAL HERNIA;  Surgeon: Lynwood MALVA Pina, MD;  Location: [facility];  Service: General;  Laterality: N/A;   WISDOM TOOTH EXTRACTION  1990's    Family History  Problem Relation Age of Onset   Diabetes Mother    Hyperlipidemia Mother    Cancer Mother    Sleep apnea Mother    Obesity Mother    Heart attack Father    Cancer Father        kidney and prostate   Hyperlipidemia Father    Other Father        gout and clogged arteries   Hypertension Father    Kidney disease Father    Alcoholism Father    Renal Disease Father    Hyperlipidemia Sister    Diabetes Sister    Diabetes Brother    Hyperlipidemia Brother    Coronary artery disease Brother    Heart disease Maternal Grandmother      Social History   Occupational History   Occupation: Dance Movement Psychotherapist  Tobacco Use   Smoking status: Never   Smokeless tobacco: Never  Vaping Use   Vaping status: Never Used  Substance and Sexual Activity   Alcohol use: Yes    Comment: ocass   Drug use: No   Sexual activity: Yes    Birth control/protection: Surgical     ROS   Objective:   Vitals: BP (!) 152/93 (BP Location: Left Arm)   Pulse 87   Temp 98.8 F (37.1 C) (Oral)   Resp 18   LMP 11/05/2015 Comment: tubal ligation  SpO2 95%   Physical Exam Constitutional:      General: She is not in acute distress.    Appearance: Normal appearance. She is well-developed and normal weight. She is not ill-appearing, toxic-appearing or diaphoretic.  HENT:     Head: Normocephalic and atraumatic.     Right Ear: Tympanic membrane, ear canal and external ear normal. No drainage or tenderness. No middle ear effusion. There is no impacted cerumen. Tympanic membrane is not erythematous or bulging.     Left Ear: Tympanic membrane, ear canal and external ear normal. No drainage or tenderness.  No middle ear effusion. There is no impacted cerumen. Tympanic membrane is not erythematous or bulging.     Nose: Nose normal. No congestion or rhinorrhea.     Mouth/Throat:     Mouth: Mucous membranes are moist. No oral lesions.     Pharynx: No pharyngeal swelling, oropharyngeal exudate, posterior oropharyngeal erythema or uvula swelling.     Tonsils: No tonsillar exudate or tonsillar abscesses. 0 on the right. 0 on the left.  Eyes:     General: No scleral icterus.       Right eye: No discharge.        Left eye: No discharge.     Extraocular Movements: Extraocular movements intact.     Right eye: Normal extraocular motion.     Left eye: Normal extraocular motion.     Conjunctiva/sclera: Conjunctivae normal.  Cardiovascular:     Rate and Rhythm: Normal rate and regular rhythm.     Heart sounds: Normal heart sounds. No murmur  heard.    No friction rub. No gallop.  Pulmonary:     Effort: Pulmonary effort is normal. No respiratory distress.     Breath sounds: No stridor. No wheezing, rhonchi or rales.  Chest:     Chest wall: No tenderness.  Musculoskeletal:     Cervical back: Normal range of motion and neck supple.  Lymphadenopathy:     Cervical: No cervical  adenopathy.  Skin:    General: Skin is warm and dry.  Neurological:     General: No focal deficit present.     Mental Status: She is alert and oriented to person, place, and time.  Psychiatric:        Mood and Affect: Mood normal.        Behavior: Behavior normal.    Results for orders placed or performed during the hospital encounter of 03/21/24 (from the past 24 hours)  POC Covid19/Flu A&B Antigen     Status: None   Collection Time: 03/21/24 10:36 AM  Result Value Ref Range   Influenza A Antigen, POC Negative Negative   Influenza B Antigen, POC Negative Negative   Covid Antigen, POC Negative Negative    Assessment and Plan :   PDMP not reviewed this encounter.  1. Viral respiratory infection   2. Shortness of breath   3. Viral illness   4. Mild intermittent asthma with (acute) exacerbation      Will use prednisone  for her respiratory symptoms, mild asthma exacerbation. Will defer imaging for now. Refilled her albuterol  inhaler. Use supportive care otherwise for a viral respiratory infection. Counseled patient on potential for adverse effects with medications prescribed/recommended today, ER and return-to-clinic precautions discussed, patient verbalized understanding.     [1]  Allergies Allergen Reactions   Amoxicillin Hives   Sulfa Antibiotics Hives and Other (See Comments)   Nitrofurantoin Other (See Comments)   Penicillins Hives and Itching    Has patient had a PCN reaction causing immediate rash, facial/tongue/throat swelling, SOB or lightheadedness with hypotension: Yes Has patient had a PCN reaction causing severe rash  involving mucus membranes or skin necrosis: No Has patient had a PCN reaction that required hospitalization No Has patient had a PCN reaction occurring within the last 10 years: Yes If all of the above answers are NO, then may proceed with Cephalosporin use.    Tape     PLASTIC TAPE   (WHELPS, TORE SKIN)   Tramadol Hives   Wound Dressing Adhesive Hives and Other (See Comments)    Adhesive agent (substance)     [name], [name] 03/21/24 1052  "

## 2024-03-21 NOTE — Discharge Instructions (Addendum)
 We will manage this as a viral respiratory infection likely worsened by your allergies, asthma. For sore throat or cough try using a honey-based tea. Use 3 teaspoons of honey with juice squeezed from half lemon. Place shaved pieces of ginger into 1/2-1 cup of water and warm over stove top. Then mix the ingredients and repeat every 4 hours as needed. Please take Tylenol  500mg -650mg  once every 6 hours for fevers, aches and pains. Hydrate very well with at least 2 liters (64 ounces) of water. Eat light meals such as soups (chicken and noodles, chicken wild rice, vegetable).  Do not eat any foods that you are allergic to.  Start an antihistamine like Zyrtec  (10mg  daily) for postnasal drainage, sinus congestion.  You can take this together with prednisone  and albuterol . Use cough syrup as needed.

## 2024-04-01 ENCOUNTER — Telehealth: Payer: 59 | Admitting: Family Medicine

## 2024-05-27 ENCOUNTER — Encounter: Payer: Self-pay | Admitting: Internal Medicine
